# Patient Record
Sex: Female | Born: 1960 | Race: Black or African American | Hispanic: No | Smoking: Current every day smoker
Health system: Southern US, Community
[De-identification: ages and names within clinical notes are randomized; demographics above are authoritative.]

## PROBLEM LIST (undated history)

## (undated) DIAGNOSIS — R112 Nausea with vomiting, unspecified: Secondary | ICD-10-CM

## (undated) DIAGNOSIS — R519 Headache, unspecified: Secondary | ICD-10-CM

## (undated) DIAGNOSIS — J189 Pneumonia, unspecified organism: Secondary | ICD-10-CM

## (undated) DIAGNOSIS — J45909 Unspecified asthma, uncomplicated: Secondary | ICD-10-CM

## (undated) DIAGNOSIS — F32A Depression, unspecified: Secondary | ICD-10-CM

## (undated) DIAGNOSIS — E7800 Pure hypercholesterolemia, unspecified: Secondary | ICD-10-CM

## (undated) DIAGNOSIS — R51 Headache: Secondary | ICD-10-CM

## (undated) DIAGNOSIS — R569 Unspecified convulsions: Secondary | ICD-10-CM

## (undated) DIAGNOSIS — Z9889 Other specified postprocedural states: Secondary | ICD-10-CM

## (undated) DIAGNOSIS — F419 Anxiety disorder, unspecified: Secondary | ICD-10-CM

## (undated) DIAGNOSIS — K219 Gastro-esophageal reflux disease without esophagitis: Secondary | ICD-10-CM

## (undated) DIAGNOSIS — I10 Essential (primary) hypertension: Secondary | ICD-10-CM

## (undated) DIAGNOSIS — F329 Major depressive disorder, single episode, unspecified: Secondary | ICD-10-CM

## (undated) DIAGNOSIS — D219 Benign neoplasm of connective and other soft tissue, unspecified: Secondary | ICD-10-CM

## (undated) DIAGNOSIS — S065X9A Traumatic subdural hemorrhage with loss of consciousness of unspecified duration, initial encounter: Secondary | ICD-10-CM

## (undated) HISTORY — PX: DIAGNOSTIC LAPAROSCOPY: SUR761

## (undated) HISTORY — PX: DILATION AND CURETTAGE OF UTERUS: SHX78

## (undated) HISTORY — DX: Essential (primary) hypertension: I10

## (undated) HISTORY — PX: FRACTURE SURGERY: SHX138

## (undated) HISTORY — PX: TONSILLECTOMY: SUR1361

---

## 2006-01-03 ENCOUNTER — Emergency Department (HOSPITAL_COMMUNITY): Admission: EM | Admit: 2006-01-03 | Discharge: 2006-01-03 | Payer: Self-pay | Admitting: Family Medicine

## 2006-01-03 ENCOUNTER — Ambulatory Visit (HOSPITAL_COMMUNITY): Admission: RE | Admit: 2006-01-03 | Discharge: 2006-01-03 | Payer: Self-pay | Admitting: Family Medicine

## 2007-09-12 ENCOUNTER — Telehealth (INDEPENDENT_AMBULATORY_CARE_PROVIDER_SITE_OTHER): Payer: Self-pay | Admitting: *Deleted

## 2007-09-26 ENCOUNTER — Ambulatory Visit: Payer: Self-pay | Admitting: Nurse Practitioner

## 2007-09-26 DIAGNOSIS — D259 Leiomyoma of uterus, unspecified: Secondary | ICD-10-CM | POA: Insufficient documentation

## 2007-10-02 ENCOUNTER — Ambulatory Visit: Payer: Self-pay | Admitting: *Deleted

## 2007-10-30 ENCOUNTER — Ambulatory Visit: Payer: Self-pay | Admitting: Nurse Practitioner

## 2007-10-30 DIAGNOSIS — I10 Essential (primary) hypertension: Secondary | ICD-10-CM | POA: Insufficient documentation

## 2007-10-30 LAB — CONVERTED CEMR LAB
Bilirubin Urine: NEGATIVE
Glucose, Urine, Semiquant: NEGATIVE
Specific Gravity, Urine: 1.01
pH: 6.5

## 2007-10-31 ENCOUNTER — Encounter (INDEPENDENT_AMBULATORY_CARE_PROVIDER_SITE_OTHER): Payer: Self-pay | Admitting: Nurse Practitioner

## 2007-10-31 DIAGNOSIS — E7800 Pure hypercholesterolemia, unspecified: Secondary | ICD-10-CM | POA: Insufficient documentation

## 2007-10-31 LAB — CONVERTED CEMR LAB
Alkaline Phosphatase: 68 units/L (ref 39–117)
Basophils Absolute: 0 10*3/uL (ref 0.0–0.1)
Basophils Relative: 2 % — ABNORMAL HIGH (ref 0–1)
Calcium: 8.9 mg/dL (ref 8.4–10.5)
Chloride: 104 meq/L (ref 96–112)
Cholesterol: 234 mg/dL — ABNORMAL HIGH (ref 0–200)
Eosinophils Relative: 8 % — ABNORMAL HIGH (ref 0–5)
Glucose, Bld: 86 mg/dL (ref 70–99)
HCT: 38.2 % (ref 36.0–46.0)
HDL: 108 mg/dL (ref >39)
Hemoglobin: 12.9 g/dL (ref 12.0–15.0)
Hep B E Ab: NEGATIVE
LDL Cholesterol: 116 mg/dL — ABNORMAL HIGH (ref 0–99)
Lipase: 13 units/L (ref 0–75)
Monocytes Absolute: 0.3 10*3/uL (ref 0.1–1.0)
RBC: 3.82 M/uL — ABNORMAL LOW (ref 3.87–5.11)
Sodium: 142 meq/L (ref 135–145)
TSH: 1.504 microintl units/mL (ref 0.350–5.50)
Total Protein: 8.1 g/dL (ref 6.0–8.3)
Triglycerides: 49 mg/dL (ref <150)

## 2007-11-06 ENCOUNTER — Ambulatory Visit (HOSPITAL_COMMUNITY): Admission: RE | Admit: 2007-11-06 | Discharge: 2007-11-06 | Payer: Self-pay | Admitting: Family Medicine

## 2007-12-02 ENCOUNTER — Telehealth (INDEPENDENT_AMBULATORY_CARE_PROVIDER_SITE_OTHER): Payer: Self-pay | Admitting: Nurse Practitioner

## 2007-12-13 ENCOUNTER — Ambulatory Visit: Payer: Self-pay | Admitting: Nurse Practitioner

## 2007-12-13 DIAGNOSIS — D72819 Decreased white blood cell count, unspecified: Secondary | ICD-10-CM | POA: Insufficient documentation

## 2007-12-13 DIAGNOSIS — R7989 Other specified abnormal findings of blood chemistry: Secondary | ICD-10-CM | POA: Insufficient documentation

## 2007-12-13 DIAGNOSIS — R945 Abnormal results of liver function studies: Secondary | ICD-10-CM

## 2007-12-13 DIAGNOSIS — R21 Rash and other nonspecific skin eruption: Secondary | ICD-10-CM | POA: Insufficient documentation

## 2007-12-13 LAB — CONVERTED CEMR LAB
Basophils Relative: 1 % (ref 0–1)
Chlamydia, DNA Probe: NEGATIVE
Eosinophils Absolute: 0.2 10*3/uL (ref 0.0–0.7)
HCT: 42.3 % (ref 36.0–46.0)
Hemoglobin: 13.9 g/dL (ref 12.0–15.0)
Lymphs Abs: 1.6 10*3/uL (ref 0.7–4.0)
MCHC: 32.9 g/dL (ref 30.0–36.0)
MCV: 103.2 fL — ABNORMAL HIGH (ref 78.0–100.0)
Neutro Abs: 1.3 10*3/uL — ABNORMAL LOW (ref 1.7–7.7)
Pap Smear: NEGATIVE
Platelets: 254 10*3/uL (ref 150–400)

## 2007-12-18 ENCOUNTER — Ambulatory Visit (HOSPITAL_COMMUNITY): Admission: RE | Admit: 2007-12-18 | Discharge: 2007-12-18 | Payer: Self-pay | Admitting: Internal Medicine

## 2007-12-18 ENCOUNTER — Encounter (INDEPENDENT_AMBULATORY_CARE_PROVIDER_SITE_OTHER): Payer: Self-pay | Admitting: Nurse Practitioner

## 2008-02-13 ENCOUNTER — Telehealth (INDEPENDENT_AMBULATORY_CARE_PROVIDER_SITE_OTHER): Payer: Self-pay | Admitting: Nurse Practitioner

## 2008-11-05 ENCOUNTER — Telehealth (INDEPENDENT_AMBULATORY_CARE_PROVIDER_SITE_OTHER): Payer: Self-pay | Admitting: Nurse Practitioner

## 2008-11-19 ENCOUNTER — Ambulatory Visit: Payer: Self-pay | Admitting: Nurse Practitioner

## 2008-11-19 DIAGNOSIS — F17200 Nicotine dependence, unspecified, uncomplicated: Secondary | ICD-10-CM | POA: Insufficient documentation

## 2008-11-19 LAB — CONVERTED CEMR LAB
Cholesterol, target level: 200 mg/dL
HDL goal, serum: 40 mg/dL

## 2008-11-23 LAB — CONVERTED CEMR LAB
ALT: 19 units/L (ref 0–35)
BUN: 8 mg/dL (ref 6–23)
CO2: 22 meq/L (ref 19–32)
Calcium: 9.2 mg/dL (ref 8.4–10.5)
Cholesterol: 250 mg/dL — ABNORMAL HIGH (ref 0–200)
Glucose, Bld: 75 mg/dL (ref 70–99)
HCT: 41 % (ref 36.0–46.0)
Hemoglobin: 13.7 g/dL (ref 12.0–15.0)
Lymphocytes Relative: 54 % — ABNORMAL HIGH (ref 12–46)
Lymphs Abs: 1.8 10*3/uL (ref 0.7–4.0)
MCV: 103 fL — ABNORMAL HIGH (ref 78.0–100.0)
Monocytes Relative: 13 % — ABNORMAL HIGH (ref 3–12)
Neutro Abs: 0.9 10*3/uL — ABNORMAL LOW (ref 1.7–7.7)
Potassium: 4.1 meq/L (ref 3.5–5.3)
RBC: 3.98 M/uL (ref 3.87–5.11)
TSH: 1.368 microintl units/mL (ref 0.350–4.50)
Total Protein: 8.4 g/dL — ABNORMAL HIGH (ref 6.0–8.3)
Triglycerides: 61 mg/dL (ref <150)
VLDL: 12 mg/dL (ref 0–40)

## 2008-11-27 ENCOUNTER — Telehealth (INDEPENDENT_AMBULATORY_CARE_PROVIDER_SITE_OTHER): Payer: Self-pay | Admitting: Nurse Practitioner

## 2008-12-04 ENCOUNTER — Ambulatory Visit: Payer: Self-pay | Admitting: Nurse Practitioner

## 2008-12-08 LAB — CONVERTED CEMR LAB
Eosinophils Absolute: 0.2 10*3/uL (ref 0.0–0.7)
Lymphocytes Relative: 39 % (ref 12–46)
Lymphs Abs: 1.3 10*3/uL (ref 0.7–4.0)
MCV: 101.4 fL — ABNORMAL HIGH (ref 78.0–100.0)
Monocytes Absolute: 0.5 10*3/uL (ref 0.1–1.0)
Neutro Abs: 1.2 10*3/uL — ABNORMAL LOW (ref 1.7–7.7)
Neutrophils Relative %: 38 % — ABNORMAL LOW (ref 43–77)
Platelets: 257 10*3/uL (ref 150–400)
RDW: 12.9 % (ref 11.5–15.5)
WBC: 3.3 10*3/uL — ABNORMAL LOW (ref 4.0–10.5)

## 2008-12-16 ENCOUNTER — Encounter (INDEPENDENT_AMBULATORY_CARE_PROVIDER_SITE_OTHER): Payer: Self-pay | Admitting: Nurse Practitioner

## 2008-12-16 ENCOUNTER — Ambulatory Visit: Payer: Self-pay | Admitting: Internal Medicine

## 2009-01-26 ENCOUNTER — Ambulatory Visit: Payer: Self-pay | Admitting: Nurse Practitioner

## 2009-01-26 ENCOUNTER — Encounter (INDEPENDENT_AMBULATORY_CARE_PROVIDER_SITE_OTHER): Payer: Self-pay | Admitting: Nurse Practitioner

## 2009-01-26 DIAGNOSIS — M6740 Ganglion, unspecified site: Secondary | ICD-10-CM | POA: Insufficient documentation

## 2009-01-26 LAB — CONVERTED CEMR LAB
Chlamydia, DNA Probe: NEGATIVE
GC Probe Amp, Genital: NEGATIVE
KOH Prep: NEGATIVE
Ketones, urine, test strip: NEGATIVE
Specific Gravity, Urine: 1.015
Urobilinogen, UA: 0.2
pH: 6.5

## 2009-01-29 ENCOUNTER — Encounter (INDEPENDENT_AMBULATORY_CARE_PROVIDER_SITE_OTHER): Payer: Self-pay | Admitting: Nurse Practitioner

## 2009-02-12 ENCOUNTER — Ambulatory Visit (HOSPITAL_COMMUNITY): Admission: RE | Admit: 2009-02-12 | Discharge: 2009-02-12 | Payer: Self-pay | Admitting: Internal Medicine

## 2009-02-12 ENCOUNTER — Encounter (INDEPENDENT_AMBULATORY_CARE_PROVIDER_SITE_OTHER): Payer: Self-pay | Admitting: Nurse Practitioner

## 2009-02-16 ENCOUNTER — Encounter (INDEPENDENT_AMBULATORY_CARE_PROVIDER_SITE_OTHER): Payer: Self-pay | Admitting: Nurse Practitioner

## 2009-02-16 ENCOUNTER — Ambulatory Visit: Payer: Self-pay | Admitting: Internal Medicine

## 2009-02-25 ENCOUNTER — Telehealth (INDEPENDENT_AMBULATORY_CARE_PROVIDER_SITE_OTHER): Payer: Self-pay | Admitting: Nurse Practitioner

## 2010-03-21 ENCOUNTER — Emergency Department (HOSPITAL_COMMUNITY): Admission: EM | Admit: 2010-03-21 | Discharge: 2010-03-22 | Payer: Self-pay | Admitting: Emergency Medicine

## 2010-06-16 ENCOUNTER — Telehealth (INDEPENDENT_AMBULATORY_CARE_PROVIDER_SITE_OTHER): Payer: Self-pay | Admitting: Nurse Practitioner

## 2010-06-16 DIAGNOSIS — K029 Dental caries, unspecified: Secondary | ICD-10-CM | POA: Insufficient documentation

## 2010-06-20 ENCOUNTER — Encounter (INDEPENDENT_AMBULATORY_CARE_PROVIDER_SITE_OTHER): Payer: Self-pay | Admitting: Nurse Practitioner

## 2010-08-10 ENCOUNTER — Ambulatory Visit: Payer: Self-pay | Admitting: Nurse Practitioner

## 2010-08-10 DIAGNOSIS — G40909 Epilepsy, unspecified, not intractable, without status epilepticus: Secondary | ICD-10-CM | POA: Insufficient documentation

## 2010-08-10 LAB — CONVERTED CEMR LAB: Rapid HIV Screen: NEGATIVE

## 2010-08-11 ENCOUNTER — Encounter (INDEPENDENT_AMBULATORY_CARE_PROVIDER_SITE_OTHER): Payer: Self-pay | Admitting: Nurse Practitioner

## 2010-08-15 LAB — CONVERTED CEMR LAB
AST: 34 units/L (ref 0–37)
Albumin: 3.9 g/dL (ref 3.5–5.2)
BUN: 7 mg/dL (ref 6–23)
Basophils Absolute: 0.1 10*3/uL (ref 0.0–0.1)
CO2: 29 meq/L (ref 19–32)
Chloride: 107 meq/L (ref 96–112)
Glucose, Bld: 92 mg/dL (ref 70–99)
HCT: 36.7 % (ref 36.0–46.0)
Hemoglobin: 12.5 g/dL (ref 12.0–15.0)
Lymphocytes Relative: 40 % (ref 12–46)
Lymphs Abs: 1.5 10*3/uL (ref 0.7–4.0)
MCHC: 34.1 g/dL (ref 30.0–36.0)
MCV: 101.4 fL — ABNORMAL HIGH (ref 78.0–100.0)
Monocytes Absolute: 0.3 10*3/uL (ref 0.1–1.0)
RBC: 3.62 M/uL — ABNORMAL LOW (ref 3.87–5.11)
Sodium: 143 meq/L (ref 135–145)
Total Bilirubin: 0.2 mg/dL — ABNORMAL LOW (ref 0.3–1.2)
Total Protein: 7.4 g/dL (ref 6.0–8.3)
WBC: 3.9 10*3/uL — ABNORMAL LOW (ref 4.0–10.5)

## 2010-08-18 ENCOUNTER — Ambulatory Visit: Payer: Self-pay | Admitting: Oncology

## 2010-08-23 ENCOUNTER — Encounter (INDEPENDENT_AMBULATORY_CARE_PROVIDER_SITE_OTHER): Payer: Self-pay | Admitting: Nurse Practitioner

## 2010-08-23 LAB — COMPREHENSIVE METABOLIC PANEL
ALT: 18 U/L (ref 0–35)
BUN: 5 mg/dL — ABNORMAL LOW (ref 6–23)
CO2: 26 mEq/L (ref 19–32)
Chloride: 103 mEq/L (ref 96–112)
Creatinine, Ser: 0.66 mg/dL (ref 0.40–1.20)
Glucose, Bld: 78 mg/dL (ref 70–99)
Potassium: 3.7 mEq/L (ref 3.5–5.3)
Total Bilirubin: 0.4 mg/dL (ref 0.3–1.2)

## 2010-08-23 LAB — CBC WITH DIFFERENTIAL/PLATELET
Basophils Absolute: 0 10*3/uL (ref 0.0–0.1)
EOS%: 6.3 % (ref 0.0–7.0)
HCT: 36.2 % (ref 34.8–46.6)
LYMPH%: 51.5 % — ABNORMAL HIGH (ref 14.0–49.7)
MCH: 35.4 pg — ABNORMAL HIGH (ref 25.1–34.0)
MCHC: 34.5 g/dL (ref 31.5–36.0)
MCV: 102.8 fL — ABNORMAL HIGH (ref 79.5–101.0)
MONO#: 0.3 10*3/uL (ref 0.1–0.9)
MONO%: 8.1 % (ref 0.0–14.0)
Platelets: 192 10*3/uL (ref 145–400)
RDW: 13.4 % (ref 11.2–14.5)
WBC: 3.5 10*3/uL — ABNORMAL LOW (ref 3.9–10.3)
lymph#: 1.8 10*3/uL (ref 0.9–3.3)

## 2010-08-26 ENCOUNTER — Ambulatory Visit: Payer: Self-pay | Admitting: Nurse Practitioner

## 2010-08-26 LAB — CONVERTED CEMR LAB
Urobilinogen, UA: 0.2
WBC Urine, dipstick: NEGATIVE
pH: 5

## 2010-08-29 LAB — CONVERTED CEMR LAB
Cholesterol: 262 mg/dL — ABNORMAL HIGH (ref 0–200)
LDL Cholesterol: 132 mg/dL — ABNORMAL HIGH (ref 0–99)

## 2010-08-30 ENCOUNTER — Encounter (INDEPENDENT_AMBULATORY_CARE_PROVIDER_SITE_OTHER): Payer: Self-pay | Admitting: Nurse Practitioner

## 2010-11-22 NOTE — Progress Notes (Signed)
Summary: requesting referral  Phone Note Call from Patient Call back at [phone]   Summary of Call: Pt is calling to see if she can get a referral to dental clinic. states front teeth had fallen out and having pain in mouth last month.  Initial call taken by: Hassell Halim CMA,  June 16, 2010 3:30 PM  Follow-up for Phone Call        Dental referral done fax to dental clinic Follow-up by: Lehman Prom FNP,  June 16, 2010 3:35 PM  Additional Follow-up for Phone Call Additional follow up Details #1::        dental referral faxed to clinic today. Additional Follow-up by: Leodis Rains,  June 20, 2010 11:15 AM  New Problems: DENTAL CARIES (ICD-521.00)   New Problems: DENTAL CARIES (ICD-521.00)

## 2010-11-22 NOTE — Letter (Signed)
Summary: DENTAL REFERRAL  DENTAL REFERRAL   Imported By: Arta Bruce 08/12/2010 10:50:35  _____________________________________________________________________  External Attachment:    Type:   Image     Comment:   External Document

## 2010-11-22 NOTE — Assessment & Plan Note (Signed)
Summary: Seizure    Vital Signs:  Patient profile:   50 year old female LMP:     06/2010 Weight:      154.4 pounds BMI:     24.09 Temp:     97.1 degrees F oral Pulse rate:   72 / minute Pulse rhythm:   regular Resp:     16 per minute BP sitting:   140 / 100  (left arm) Cuff size:   regular  Vitals Entered By: Levon Hedger (August 10, 2010 3:39 PM) CC: pt states she had a seizure and had a brain scan at [facility]. about a week ago she had another seizure and she states that she has been under alot of stress, Hypertension Management, Lipid Management Is Patient Diabetic? No Pain Assessment Patient in pain? no       Does patient need assistance? Functional Status Self care Ambulation Normal Comments pt is not taking any medication. LMP (date): 06/2010 LMP - Character: heavy     Enter LMP: 06/2010 Last PAP Result NEGATIVE FOR INTRAEPITHELIAL LESIONS OR MALIGNANCY.   CC:  pt states she had a seizure and had a brain scan at [facility]. about a week ago she had another seizure and she states that she has been under alot of stress, Hypertension Management, and Lipid Management.  History of Present Illness:  Pt into the office with complaints of not feeling well for the past 3 months. She reports 2 seizures over the past 3 months. Pt lost consciousness with both seizures.  No prior history of seizures. Ambulance called and pt was taken to the hospital on the first episode and then on the second episode she was give a choice when she regained consciousness. +incontinence (loss of bladder) +family witnessed jerking movements +biting of lips and jaw Head CT done in hospital  Social - pt is still employed at [employer] to increased stressors in her life due to finances.  Tobacco abuse - ongoing  ETOH - use (would not admit to a certain amount) at minimal 1 beer daily.  No current meds - she was previously on blood pressure and cholesterol meds of which she is not taking  Pt  is not driving at this time as her car is broken down.  She either walks or catches the bus  Hypertension History:      She denies headache, chest pain, and palpitations.  Pt has stopped taking her blood pressure medications and does not offer an excuse for not taking it.        Positive major cardiovascular risk factors include hyperlipidemia, hypertension, and current tobacco user.  Negative major cardiovascular risk factors include female age less than 27 years old, no history of diabetes, and negative family history for ischemic heart disease.        Further assessment for target organ damage reveals no history of ASHD, cardiac end-organ damage (CHF/LVH), stroke/TIA, peripheral vascular disease, renal insufficiency, or hypertensive retinopathy.    Lipid Management History:      Positive NCEP/ATP III risk factors include current tobacco user and hypertension.  Negative NCEP/ATP III risk factors include female age less than 76 years old, non-diabetic, HDL cholesterol greater than 60, no family history for ischemic heart disease, no ASHD (atherosclerotic heart disease), no prior stroke/TIA, no peripheral vascular disease, and no history of aortic aneurysm.        Comments include: pt was on zetia but has not been taking it recently.      Habits &  Providers  Alcohol-Tobacco-Diet     Alcohol drinks/day: 1     Alcohol Counseling: to decrease amount and/or frequency of alcohol intake     Alcohol type: beer     Tobacco Status: current     Tobacco Counseling: to quit use of tobacco products     Cigarette Packs/Day: 1/2  Exercise-Depression-Behavior     Does Patient Exercise: yes     Type of exercise: walking     Times/week: 4     Have you felt down or hopeless? no     Have you felt little pleasure in things? no     Depression Counseling: not indicated; screening negative for depression     Seat Belt Use: 100  Comments: walks back and forth to work  Allergies (verified): 1)  !  Tylenol  Review of Systems CV:  Denies chest pain or discomfort. Resp:  Denies cough. GI:  Denies abdominal pain, nausea, and vomiting. Neuro:  Complains of seizures; denies headaches. Psych:  Complains of anxiety; admits to increase stressors in her life.  Physical Exam  General:  alert.   Head:  normocephalic.   Eyes:  glasses Mouth:  poor dentation Lungs:  normal breath sounds.   Heart:  normal rate and regular rhythm.   Msk:  up to the exam table Neurologic:  alert & oriented X3.   Skin:  color normal.   Psych:  Oriented X3.     Impression & Recommendations:  Problem # 1:  SEIZURE DISORDER (ICD-780.39) will need to find out cause pt may need referral to neurology  Problem # 2:  TOBACCO ABUSE (ICD-305.1) ongoing.  pt was advised to quit.  Problem # 3:  HYPERTENSION, BENIGN ESSENTIAL (ICD-401.1) will restart on meds Her updated medication list for this problem includes:    Lisinopril 10 Mg Tabs (Lisinopril) ..... One tablet by mouth daily for blood pressure  Orders: T-Comprehensive Metabolic Panel (13086-57846) T-CBC w/Diff (96295-28413) Rapid HIV  (24401) T-Syphilis Test (RPR) (02725-36644) T-TSH (03474-25956)  Problem # 4:  HYPERCHOLESTEROLEMIA (ICD-272.0) pt is not taking meds. will need to check on next visit Her updated medication list for this problem includes:    Zetia 10 Mg Tabs (Ezetimibe) ..... 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 One tablet by mouth daily for blood pressure 2)  Vicoprofen 7.5-200 Mg Tabs (Hydrocodone-ibuprofen) .Marland Kitchen.. 1 tablet daily as needed for pain 3)  Zetia 10 Mg Tabs (Ezetimibe) .... Hold  Hypertension Assessment/Plan:      The patient's hypertensive risk group is category B:  At least one risk factor (excluding diabetes) with no target organ damage.  Her calculated 10 year risk of coronary heart disease is 7 %.  Today's blood pressure is 140/100.  Her blood pressure goal is < 140/90.  Lipid Assessment/Plan:      Based on NCEP/ATP III, the patient's risk factor category is "0-1 risk factors".  The patient's lipid goals are as follows: Total cholesterol goal is 200; LDL cholesterol goal is 160; HDL cholesterol goal is 40; Triglyceride goal is 150.    Patient Instructions: 1)  You have declined the flu vaccine today but if you change your mind and would like to get the vaccine then return to this office.  This is not a  live virus so it will not make you sick. It was a live virus years ago so it may have made you sick years ago 2)  Follow up in 2 weeks with n.martin,fnp for f/u on seizures.  Come fasting after midnight before this appointment.  You will get lipids, u/a, microalbumin. 3)  You will be notified of any abnormal labs done in office today. 4)  Seizures can have many causes - will check labs today.  Also may be due to increase in stress Prescriptions: LISINOPRIL 10 MG TABS (LISINOPRIL) One tablet by mouth daily for blood pressure  #30 x 3   Entered and Authorized by:   Lehman Prom FNP   Signed by:   Lehman Prom FNP on 08/10/2010   Method used:   Print then Give to Patient   RxID:   [id number]    Orders Added: 1)  Est. Patient Level III [87564] 2)  T-Comprehensive Metabolic Panel [80053-22900] 3)  T-CBC w/Diff [33295-18841] 4)  Rapid HIV  [92370] 5)  T-Syphilis Test (RPR) [66063-01601] 6)  T-TSH [09323-55732] 7)  Dental Referral [Dentist]    Laboratory Results    Other Tests  Rapid HIV: negative

## 2010-11-22 NOTE — Letter (Signed)
Summary: Handout Printed  Printed Handout:  - Diet - Low-Cholesterol Guidelines 

## 2010-11-22 NOTE — Letter (Signed)
Summary: Lipid Letter  [facility]  [address] [place], [place] [address]   Phone: [phone]  Fax: [phone]    08/30/2010  [facility] [address] [place], [place]  [address]  Dear Bosie Clos:  We have carefully reviewed your last lipid profile from 08/26/2010 and the results are noted below with a summary of recommendations for lipid management.    Cholesterol:      262    Goal: less than 200   HDL "good" Cholesterol:   295     Goal: greater than 40   LDL "bad" Cholesterol:   132     Goal: less than 130   Triglycerides:       58     Goal: less than 150    Your total cholesterol is VERY elevated.  You should have been contacted by this office about the need to start medications. You should call to schedule a fasting lab visit in 6-8 weeks AFTER you start the medications.  You will need labs to see if the cholesterol is improving and to check your liver.      Current Medications: 1)    Lisinopril 10 Mg Tabs (Lisinopril) .... One tablet by mouth daily for blood pressure 2)    Vicoprofen 7.5-200 Mg  Tabs (Hydrocodone-ibuprofen) .Marland Kitchen.. 1 tablet daily as needed for pain 3)    Pravastatin Sodium 20 Mg Tabs (Pravastatin sodium) .... One tablet by mouth nightly for cholesterol  If you have any questions, please call. We appreciate being able to work with you.   Sincerely,    [facility] Lehman Prom FNP

## 2010-11-22 NOTE — Letter (Signed)
Summary: Handout Printed  Printed Handout:  - Non-epileptic Seizures - Brief

## 2010-11-22 NOTE — Letter (Signed)
[facility]  [facility]   Imported By: Arta Bruce 09/21/2010 11:03:19  _____________________________________________________________________  External Attachment:    Type:   Image     Comment:   External Document

## 2010-11-22 NOTE — Assessment & Plan Note (Signed)
Summary: F/u 2 week - Seizures   Vital Signs:  Patient profile:   50 year old female Menstrual status:  perimenopausal Weight:      149.4 pounds BMI:     23.31 Temp:     97.2 degrees F oral Pulse rate:   84 / minute Pulse rhythm:   regular Resp:     20 per minute BP sitting:   126 / 90  (left arm) Cuff size:   regular  Vitals Entered By: Levon Hedger (August 26, 2010 11:01 AM) CC: extremely stressed, Hypertension Management, Lipid Management Is Patient Diabetic? No Pain Assessment Patient in pain? no       Does patient need assistance? Functional Status Self care Ambulation Normal LMP - Character: heavy     Menstrual Status perimenopausal Last PAP Result NEGATIVE FOR INTRAEPITHELIAL LESIONS OR MALIGNANCY.   CC:  extremely stressed, Hypertension Management, and Lipid Management.  History of Present Illness:  Pt into the office for 2 week f/u on seizures. No seizure activities since her last visit. Admits that stress is a contributor to her health.  Leukocytosis - Pt did go to the cancer center and got labs earlier week. She is waiting for the results  Hypertension History:      She denies headache, chest pain, and palpitations.  Pt has started taking her blood pressure medications as ordered.        Positive major cardiovascular risk factors include hyperlipidemia, hypertension, and current tobacco user.  Negative major cardiovascular risk factors include female age less than 22 years old, no history of diabetes, and negative family history for ischemic heart disease.        Further assessment for target organ damage reveals no history of ASHD, cardiac end-organ damage (CHF/LVH), stroke/TIA, peripheral vascular disease, renal insufficiency, or hypertensive retinopathy.    Lipid Management History:      Positive NCEP/ATP III risk factors include current tobacco user and hypertension.  Negative NCEP/ATP III risk factors include female age less than 32 years old,  non-diabetic, HDL cholesterol greater than 60, no family history for ischemic heart disease, no ASHD (atherosclerotic heart disease), no prior stroke/TIA, no peripheral vascular disease, and no history of aortic aneurysm.      Habits & Providers  Alcohol-Tobacco-Diet     Alcohol drinks/day: 1     Alcohol Counseling: to decrease amount and/or frequency of alcohol intake     Alcohol type: beer     Tobacco Status: current     Tobacco Counseling: to quit use of tobacco products     Cigarette Packs/Day: 1/2  Exercise-Depression-Behavior     Does Patient Exercise: yes     Exercise Counseling: not indicated; exercise is adequate     Type of exercise: walking     Times/week: 4     Have you felt little pleasure in things? no     Depression Counseling: not indicated; screening negative for depression     Seat Belt Use: 100  Allergies (verified): 1)  ! Tylenol  Review of Systems General:  Denies fever. CV:  Denies chest pain or discomfort. Resp:  Denies cough. GI:  Denies abdominal pain, nausea, and vomiting. Neuro:  Denies seizures. Psych:  Complains of anxiety.  Physical Exam  General:  alert.   Head:  normocephalic.   Eyes:  glasses Mouth:  poor dentition.   Lungs:  normal breath sounds.   Heart:  normal rate and regular rhythm.   Abdomen:  normal bowel sounds.  Msk:  up to the exam table Neurologic:  alert & oriented X3.   Skin:  color normal.   Psych:  Oriented X3.     Impression & Recommendations:  Problem # 1:  SEIZURE DISORDER (ICD-780.39) none since last visit ? pseudoseizure - pt is trying to decrease stress offered mental health counseling - pt declined  Problem # 2:  HYPERTENSION, BENIGN ESSENTIAL (ICD-401.1) BP is doing well today advised pt to continue current meds Her updated medication list for this problem includes:    Lisinopril 10 Mg Tabs (Lisinopril) ..... One tablet by mouth daily for blood pressure  Orders: T-Urine Microalbumin w/creat. ratio  [phone]) UA Dipstick w/o Micro (manual) (08657)  Problem # 3:  HYPERCHOLESTEROLEMIA (ICD-272.0) will check today Her updated medication list for this problem includes:    Zetia 10 Mg Tabs (Ezetimibe) ..... Hold  Orders: T-Lipid Profile ([id number])  Problem # 4:  TOBACCO ABUSE (ICD-305.1) advised cessation  Complete Medication List: 1)  Lisinopril 10 Mg Tabs (Lisinopril) .... One tablet by mouth daily for blood pressure 2)  Vicoprofen 7.5-200 Mg Tabs (Hydrocodone-ibuprofen) .Marland Kitchen.. 1 tablet daily as needed for pain 3)  Zetia 10 Mg Tabs (Ezetimibe) .... Hold  Hypertension Assessment/Plan:      The patient's hypertensive risk group is category B: At least one risk factor (excluding diabetes) with no target organ damage.  Her calculated 10 year risk of coronary heart disease is 6 %.  Today's blood pressure is 126/90.  Her blood pressure goal is < 140/90.  Lipid Assessment/Plan:      Based on NCEP/ATP III, the patient's risk factor category is "0-1 risk factors".  The patient's lipid goals are as follows: Total cholesterol goal is 200; LDL cholesterol goal is 160; HDL cholesterol goal is 40; Triglyceride goal is 150.    Patient Instructions: 1)  Seizures - glad you have had an a seizure since your last visit. 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 3)  The way to tell this definitively is to have an EEG to check brain waves. 4)  You should try hard to decrease stessors. 5)  This office can schedule you an appointment with the mental health counselor for sessions.  May also need some medications but this is only if absolutely necessary. 6)  Keep follow up with Blood specialist if necessary. 7)  Follow up in this office in 6 months for blood pressure or sooner if necessary   Orders Added: 1)  Est. Patient Level III [41324] 2)  T-Lipid Profile [80061-22930] 3)  T-Urine Microalbumin w/creat. ratio [82043-[address]] 4)  UA Dipstick w/o Micro (manual)  [81002]    Laboratory Results   Urine Tests  Date/Time Received: August 26, 2010 11:38 AM   Routine Urinalysis   Color: lt. yellow Appearance: Clear Glucose: negative   (Normal Range: Negative) Bilirubin: negative   (Normal Range: Negative) Ketone: trace (5)   (Normal Range: Negative) Spec. Gravity: 1.020   (Normal Range: 1.003-1.035) Blood: negative   (Normal Range: Negative) pH: 5.0   (Normal Range: 5.0-8.0) Protein: negative   (Normal Range: Negative) Urobilinogen: 0.2   (Normal Range: 0-1) Nitrite: negative   (Normal Range: Negative) Leukocyte Esterace: negative   (Normal Range: Negative)       Appended Document: F/u 2 week - Seizures pt declined the flu vaccine

## 2010-11-22 NOTE — Letter (Signed)
Summary: Handout Printed  Printed Handout:  - Seizure, Adult

## 2010-11-22 NOTE — Letter (Signed)
Summary: DENTAL REFERRAL  DENTAL REFERRAL   Imported By: Arta Bruce 06/20/2010 11:32:30  _____________________________________________________________________  External Attachment:    Type:   Image     Comment:   External Document

## 2011-01-09 LAB — POCT I-STAT, CHEM 8
BUN: 5 mg/dL — ABNORMAL LOW (ref 6–23)
Calcium, Ion: 1.07 mmol/L — ABNORMAL LOW (ref 1.12–1.32)
Chloride: 101 mEq/L (ref 96–112)
Glucose, Bld: 107 mg/dL — ABNORMAL HIGH (ref 70–99)
HCT: 36 % (ref 36.0–46.0)
Hemoglobin: 12.2 g/dL (ref 12.0–15.0)
Potassium: 2.7 mEq/L — CL (ref 3.5–5.1)
Sodium: 138 mEq/L (ref 135–145)
TCO2: 27 mmol/L (ref 0–100)

## 2011-01-09 LAB — POCT CARDIAC MARKERS: Myoglobin, poc: >500 ng/mL (ref 12–200)

## 2011-01-09 LAB — DIFFERENTIAL
Basophils Absolute: 0 10*3/uL (ref 0.0–0.1)
Eosinophils Relative: 2 % (ref 0–5)
Lymphocytes Relative: 21 % (ref 12–46)

## 2011-01-09 LAB — URINE MICROSCOPIC-ADD ON

## 2011-01-09 LAB — CBC
Hemoglobin: 11.4 g/dL — ABNORMAL LOW (ref 12.0–15.0)
MCHC: 34.3 g/dL (ref 30.0–36.0)
MCV: 102.7 fL — ABNORMAL HIGH (ref 78.0–100.0)
RBC: 3.24 MIL/uL — ABNORMAL LOW (ref 3.87–5.11)

## 2011-01-09 LAB — URINALYSIS, ROUTINE W REFLEX MICROSCOPIC
Hgb urine dipstick: NEGATIVE
Nitrite: NEGATIVE
Protein, ur: 30 mg/dL — AB
Specific Gravity, Urine: 1.027 (ref 1.005–1.030)
Urobilinogen, UA: 1 mg/dL (ref 0.0–1.0)

## 2011-01-09 LAB — RAPID URINE DRUG SCREEN, HOSP PERFORMED
Amphetamines: NOT DETECTED
Barbiturates: NOT DETECTED
Benzodiazepines: NOT DETECTED
Cocaine: NOT DETECTED

## 2011-01-09 LAB — ETHANOL: Alcohol, Ethyl (B): <5 mg/dL (ref 0–10)

## 2011-01-09 LAB — GLUCOSE, CAPILLARY

## 2011-01-13 ENCOUNTER — Emergency Department (HOSPITAL_COMMUNITY)
Admission: EM | Admit: 2011-01-13 | Discharge: 2011-01-13 | Disposition: A | Discharge status: Home / Self Care | Payer: Self-pay | Attending: Emergency Medicine | Admitting: Emergency Medicine

## 2011-01-13 ENCOUNTER — Emergency Department (HOSPITAL_COMMUNITY): Payer: Self-pay

## 2011-01-13 DIAGNOSIS — S0100XA Unspecified open wound of scalp, initial encounter: Secondary | ICD-10-CM | POA: Insufficient documentation

## 2011-01-13 DIAGNOSIS — I10 Essential (primary) hypertension: Secondary | ICD-10-CM | POA: Insufficient documentation

## 2011-01-13 DIAGNOSIS — Y990 Civilian activity done for income or pay: Secondary | ICD-10-CM | POA: Insufficient documentation

## 2011-01-13 DIAGNOSIS — G40909 Epilepsy, unspecified, not intractable, without status epilepticus: Secondary | ICD-10-CM | POA: Insufficient documentation

## 2011-01-13 DIAGNOSIS — W19XXXA Unspecified fall, initial encounter: Secondary | ICD-10-CM | POA: Insufficient documentation

## 2011-01-21 ENCOUNTER — Emergency Department (HOSPITAL_COMMUNITY)
Admission: EM | Admit: 2011-01-21 | Discharge: 2011-01-21 | Disposition: A | Discharge status: Home / Self Care | Payer: Self-pay | Attending: Emergency Medicine | Admitting: Emergency Medicine

## 2011-01-21 DIAGNOSIS — Z4802 Encounter for removal of sutures: Secondary | ICD-10-CM | POA: Insufficient documentation

## 2011-01-21 DIAGNOSIS — L259 Unspecified contact dermatitis, unspecified cause: Secondary | ICD-10-CM | POA: Insufficient documentation

## 2011-07-05 ENCOUNTER — Encounter: Payer: Self-pay | Admitting: Obstetrics & Gynecology

## 2011-09-02 ENCOUNTER — Inpatient Hospital Stay (HOSPITAL_COMMUNITY): Payer: Worker's Compensation

## 2011-09-02 ENCOUNTER — Inpatient Hospital Stay (HOSPITAL_COMMUNITY)
Admission: AD | Admit: 2011-09-02 | Discharge: 2011-09-04 | DRG: 512 | Disposition: A | Discharge status: Home / Self Care | Payer: Worker's Compensation | Source: Ambulatory Visit | Attending: Orthopedic Surgery | Admitting: Orthopedic Surgery

## 2011-09-02 DIAGNOSIS — Y990 Civilian activity done for income or pay: Secondary | ICD-10-CM

## 2011-09-02 DIAGNOSIS — F17200 Nicotine dependence, unspecified, uncomplicated: Secondary | ICD-10-CM | POA: Diagnosis present

## 2011-09-02 DIAGNOSIS — I10 Essential (primary) hypertension: Secondary | ICD-10-CM | POA: Diagnosis present

## 2011-09-02 DIAGNOSIS — S52209A Unspecified fracture of shaft of unspecified ulna, initial encounter for closed fracture: Principal | ICD-10-CM | POA: Diagnosis present

## 2011-09-02 DIAGNOSIS — Y9269 Other specified industrial and construction area as the place of occurrence of the external cause: Secondary | ICD-10-CM

## 2011-09-02 DIAGNOSIS — W2203XA Walked into furniture, initial encounter: Secondary | ICD-10-CM | POA: Diagnosis present

## 2011-09-02 LAB — DIFFERENTIAL
Eosinophils Absolute: 0.1 10*3/uL (ref 0.0–0.7)
Eosinophils Relative: 2 % (ref 0–5)
Lymphocytes Relative: 40 % (ref 12–46)
Lymphs Abs: 2.2 10*3/uL (ref 0.7–4.0)
Monocytes Absolute: 0.5 10*3/uL (ref 0.1–1.0)

## 2011-09-02 LAB — CBC
HCT: 36.9 % (ref 36.0–46.0)
MCH: 34.2 pg — ABNORMAL HIGH (ref 26.0–34.0)
MCV: 102.5 fL — ABNORMAL HIGH (ref 78.0–100.0)
RBC: 3.6 MIL/uL — ABNORMAL LOW (ref 3.87–5.11)
WBC: 5.4 10*3/uL (ref 4.0–10.5)

## 2011-09-02 LAB — BASIC METABOLIC PANEL
BUN: 7 mg/dL (ref 6–23)
CO2: 28 mEq/L (ref 19–32)
Calcium: 9 mg/dL (ref 8.4–10.5)
Creatinine, Ser: 0.62 mg/dL (ref 0.50–1.10)
GFR calc non Af Amer: >90 mL/min (ref >90)
Glucose, Bld: 77 mg/dL (ref 70–99)

## 2011-09-02 MED ORDER — POTASSIUM CHLORIDE IN NACL 20-0.9 MEQ/L-% IV SOLN
INTRAVENOUS | Status: DC
  Administered 2011-09-02: 23:00:00 via INTRAVENOUS
  Filled 2011-09-02 (×4): qty 1000

## 2011-09-02 MED ORDER — KCL IN DEXTROSE-NACL 20-5-0.9 MEQ/L-%-% IV SOLN
INTRAVENOUS | Status: DC
  Administered 2011-09-03: 21:00:00 via INTRAVENOUS
  Filled 2011-09-02 (×3): qty 1000

## 2011-09-02 MED ORDER — CEFAZOLIN SODIUM-DEXTROSE 2-3 GM-% IV SOLR
2.0000 g | INTRAVENOUS | Status: AC
  Administered 2011-09-03: 2 g via INTRAVENOUS
  Filled 2011-09-02: qty 50

## 2011-09-02 NOTE — H&P (Signed)
Cynthia Welch is an 50 y.o. female.   Chief Complaint:  HPI: Cynthia Welch is a 50 year old right-hand dominant female with right arm pain she was working at [employer] today which her arm hit a table she developed some pain in the arm was able to keep working after developing swelling in the arm she was told by her supervisor to go and have it looked at the urgent care I was called by Dr. Alwyn Ren tonight and subsequently she was determined to have a fractured ulna she presents now for further operative management. She denies numbness tingling the hand she denies any prodromal symptoms in the right arm and hand.  Past medical history is negative  Past surgical history is negative  Family history is negative for deep vein thrombosis Social History:  does not have a smoking history on file. She does not have any smokeless tobacco history on file. Her alcohol and drug histories not on file. patient does have support in the savings. She lives with her boyfriend.  Allergies:  Allergies  Allergen Reactions  . Acetaminophen     REACTION: itching    Medications Prior to Admission  Medication Dose Route Frequency Provider Last Rate Last Dose  . dextrose 5 % and 0.9 % NaCl with KCl 20 mEq/L infusion   Intravenous Continuous Cammy Copa       No current outpatient prescriptions on file as of 09/02/2011.    Results for orders placed during the hospital encounter of 09/02/11 (from the past 48 hour(s))  CBC     Status: Abnormal   Collection Time   09/02/11  9:44 PM      Component Value Range Comment   WBC 5.4  4.0 - 10.5 (K/uL)    RBC 3.60 (*) 3.87 - 5.11 (MIL/uL)    Hemoglobin 12.3  12.0 - 15.0 (g/dL)    HCT 47.8  29.5 - 62.1 (%)    MCV 102.5 (*) 78.0 - 100.0 (fL)    MCH 34.2 (*) 26.0 - 34.0 (pg)    MCHC 33.3  30.0 - 36.0 (g/dL)    RDW 30.8  65.7 - 84.6 (%)    Platelets 246  150 - 400 (K/uL)   DIFFERENTIAL     Status: Normal   Collection Time   09/02/11  9:44 PM      Component  Value Range Comment   Neutrophils Relative 48  43 - 77 (%)    Neutro Abs 2.6  1.7 - 7.7 (K/uL)    Lymphocytes Relative 40  12 - 46 (%)    Lymphs Abs 2.2  0.7 - 4.0 (K/uL)    Monocytes Relative 9  3 - 12 (%)    Monocytes Absolute 0.5  0.1 - 1.0 (K/uL)    Eosinophils Relative 2  0 - 5 (%)    Eosinophils Absolute 0.1  0.0 - 0.7 (K/uL)    Basophils Relative 1  0 - 1 (%)    Basophils Absolute 0.1  0.0 - 0.1 (K/uL)    No results found.  Review of Systems  Musculoskeletal: Positive for joint pain.  All other systems reviewed and are negative.    Blood pressure 132/92, pulse 81, temperature 98.8 F (37.1 C), resp. rate 18, SpO2 100.00%. Physical Exam  Constitutional: She is oriented to person, place, and time. She appears well-developed and well-nourished.  HENT:  Head: Normocephalic and atraumatic.  Eyes: Conjunctivae and EOM are normal. Pupils are equal, round, and reactive to light.  Neck: Normal  range of motion. Neck supple.  Cardiovascular: Normal rate, regular rhythm, normal heart sounds and intact distal pulses.   Respiratory: Effort normal and breath sounds normal.  GI: Soft.  Musculoskeletal:       Arms: Neurological: She is alert and oriented to person, place, and time.  Skin: Skin is warm and dry.  Psychiatric: She has a normal mood and affect. Her behavior is normal. Judgment normal.     Assessment/Plan Outside radiographs are reviewed of the right arm she does have a ulnar shaft fracture oblique which is displaced impression; right displaced ulnar shaft fracture from impact plan open reduction internal fixation risks and benefits are discussed with the patient including but not limited to infection nerve or vessel damage incomplete union her risk of malunion and 9 units slightly higher because of her smoking time out of work will be on the order of 6 weeks all questions answered patient understands risks and benefits medical decision-making calcaneum a decision for  surgery.  DEAN,GREGORY SCOTT 09/02/2011, 10:43 PM

## 2011-09-03 ENCOUNTER — Encounter (HOSPITAL_COMMUNITY)
Admission: AD | Disposition: A | Discharge status: Home / Self Care | Payer: Self-pay | Source: Ambulatory Visit | Attending: Orthopedic Surgery

## 2011-09-03 ENCOUNTER — Encounter (HOSPITAL_COMMUNITY): Payer: Self-pay | Admitting: Anesthesiology

## 2011-09-03 ENCOUNTER — Inpatient Hospital Stay (HOSPITAL_COMMUNITY): Payer: Worker's Compensation

## 2011-09-03 ENCOUNTER — Other Ambulatory Visit: Payer: Self-pay

## 2011-09-03 ENCOUNTER — Inpatient Hospital Stay (HOSPITAL_COMMUNITY): Payer: Worker's Compensation | Admitting: Anesthesiology

## 2011-09-03 HISTORY — PX: ORIF ULNAR FRACTURE: SHX5417

## 2011-09-03 SURGERY — OPEN REDUCTION INTERNAL FIXATION (ORIF) ULNAR FRACTURE
Anesthesia: General | Laterality: Right

## 2011-09-03 MED ORDER — MORPHINE SULFATE 2 MG/ML IJ SOLN
2.0000 mg | INTRAMUSCULAR | Status: DC | PRN
  Filled 2011-09-03: qty 1

## 2011-09-03 MED ORDER — ONDANSETRON HCL 4 MG/2ML IJ SOLN
INTRAMUSCULAR | Status: DC | PRN
  Administered 2011-09-03: 4 mg via INTRAVENOUS

## 2011-09-03 MED ORDER — NEOSTIGMINE METHYLSULFATE 1 MG/ML IJ SOLN
INTRAMUSCULAR | Status: DC | PRN
  Administered 2011-09-03: 4 mg via INTRAVENOUS

## 2011-09-03 MED ORDER — FENTANYL CITRATE 0.05 MG/ML IJ SOLN
INTRAMUSCULAR | Status: DC | PRN
  Administered 2011-09-03: 100 ug via INTRAVENOUS
  Administered 2011-09-03: 50 ug via INTRAVENOUS
  Administered 2011-09-03: 100 ug via INTRAVENOUS
  Administered 2011-09-03: 50 ug via INTRAVENOUS

## 2011-09-03 MED ORDER — CEFAZOLIN SODIUM 1-5 GM-% IV SOLN
1.0000 g | Freq: Four times a day (QID) | INTRAVENOUS | Status: AC
  Administered 2011-09-03 – 2011-09-04 (×3): 1 g via INTRAVENOUS
  Filled 2011-09-03 (×3): qty 50

## 2011-09-03 MED ORDER — LACTATED RINGERS IV SOLN: INTRAVENOUS | Status: DC

## 2011-09-03 MED ORDER — ONDANSETRON HCL 4 MG PO TABS: 4.0000 mg | ORAL_TABLET | Freq: Four times a day (QID) | ORAL | Status: DC | PRN

## 2011-09-03 MED ORDER — MIDAZOLAM HCL 5 MG/5ML IJ SOLN
INTRAMUSCULAR | Status: DC | PRN
  Administered 2011-09-03: 2 mg via INTRAVENOUS

## 2011-09-03 MED ORDER — HYDROMORPHONE HCL PF 1 MG/ML IJ SOLN
0.2500 mg | INTRAMUSCULAR | Status: DC | PRN
  Administered 2011-09-03 (×5): 0.5 mg via INTRAVENOUS
  Filled 2011-09-03: qty 1

## 2011-09-03 MED ORDER — GLYCOPYRROLATE 0.2 MG/ML IJ SOLN
INTRAMUSCULAR | Status: DC | PRN
  Administered 2011-09-03: .6 mg via INTRAVENOUS

## 2011-09-03 MED ORDER — METHOCARBAMOL 500 MG PO TABS
500.0000 mg | ORAL_TABLET | Freq: Four times a day (QID) | ORAL | Status: DC | PRN
  Administered 2011-09-03: 500 mg via ORAL
  Filled 2011-09-03: qty 1

## 2011-09-03 MED ORDER — MORPHINE SULFATE 2 MG/ML IJ SOLN: 1.0000 mg | INTRAMUSCULAR | Status: DC | PRN

## 2011-09-03 MED ORDER — MIDAZOLAM HCL 2 MG/2ML IJ SOLN: 0.5000 mg | Freq: Once | INTRAMUSCULAR | Status: AC | PRN

## 2011-09-03 MED ORDER — SENNOSIDES-DOCUSATE SODIUM 8.6-50 MG PO TABS: 1.0000 | ORAL_TABLET | Freq: Every evening | ORAL | Status: DC | PRN

## 2011-09-03 MED ORDER — METOCLOPRAMIDE HCL 5 MG/ML IJ SOLN
5.0000 mg | Freq: Three times a day (TID) | INTRAMUSCULAR | Status: DC | PRN
Start: 2011-09-03 — End: 2011-09-04
  Filled 2011-09-03: qty 2

## 2011-09-03 MED ORDER — SODIUM CHLORIDE 0.9 % IR SOLN
Status: DC | PRN
  Administered 2011-09-03: 1000 mL

## 2011-09-03 MED ORDER — PROMETHAZINE HCL 25 MG/ML IJ SOLN: 6.2500 mg | INTRAMUSCULAR | Status: DC | PRN

## 2011-09-03 MED ORDER — METOPROLOL TARTRATE 1 MG/ML IV SOLN
INTRAVENOUS | Status: DC | PRN
  Administered 2011-09-03: 1 mg via INTRAVENOUS

## 2011-09-03 MED ORDER — LACTATED RINGERS IV SOLN
INTRAVENOUS | Status: DC | PRN
  Administered 2011-09-03 (×2): via INTRAVENOUS

## 2011-09-03 MED ORDER — CEFAZOLIN SODIUM 1-5 GM-% IV SOLN
INTRAVENOUS | Status: DC | PRN
  Administered 2011-09-03: 2 g via INTRAVENOUS

## 2011-09-03 MED ORDER — ROCURONIUM BROMIDE 100 MG/10ML IV SOLN
INTRAVENOUS | Status: DC | PRN
  Administered 2011-09-03: 40 mg via INTRAVENOUS
  Administered 2011-09-03: 10 mg via INTRAVENOUS

## 2011-09-03 MED ORDER — MEPERIDINE HCL 25 MG/ML IJ SOLN: 6.2500 mg | INTRAMUSCULAR | Status: DC | PRN

## 2011-09-03 MED ORDER — ONDANSETRON HCL 4 MG/2ML IJ SOLN: 4.0000 mg | Freq: Four times a day (QID) | INTRAMUSCULAR | Status: DC | PRN

## 2011-09-03 MED ORDER — HYDROMORPHONE HCL PF 1 MG/ML IJ SOLN
INTRAMUSCULAR | Status: AC
  Administered 2011-09-03: 0.5 mg via INTRAVENOUS
  Filled 2011-09-03: qty 1

## 2011-09-03 MED ORDER — METOCLOPRAMIDE HCL 10 MG PO TABS
5.0000 mg | ORAL_TABLET | Freq: Three times a day (TID) | ORAL | Status: DC | PRN
Start: 2011-09-03 — End: 2011-09-04
  Administered 2011-09-03: 10 mg via ORAL
  Filled 2011-09-03: qty 2

## 2011-09-03 MED ORDER — KCL IN DEXTROSE-NACL 20-5-0.9 MEQ/L-%-% IV SOLN
INTRAVENOUS | Status: AC
  Filled 2011-09-03: qty 1000

## 2011-09-03 MED ORDER — OXYCODONE HCL 5 MG PO TABS
5.0000 mg | ORAL_TABLET | ORAL | Status: DC | PRN
  Administered 2011-09-04: 10 mg via ORAL
  Administered 2011-09-04: 5 mg via ORAL
  Administered 2011-09-04: 10 mg via ORAL
  Filled 2011-09-03: qty 1
  Filled 2011-09-03 (×2): qty 2

## 2011-09-03 MED ORDER — METHOCARBAMOL 100 MG/ML IJ SOLN: 500.0000 mg | Freq: Four times a day (QID) | INTRAVENOUS | Status: DC | PRN

## 2011-09-03 MED ORDER — PROPOFOL 10 MG/ML IV EMUL
INTRAVENOUS | Status: DC | PRN
  Administered 2011-09-03: 140 mg via INTRAVENOUS

## 2011-09-03 SURGICAL SUPPLY — 63 items
BANDAGE ELASTIC 4 VELCRO ST LF (GAUZE/BANDAGES/DRESSINGS) ×2 IMPLANT
BANDAGE GAUZE ELAST BULKY 4 IN (GAUZE/BANDAGES/DRESSINGS) IMPLANT
BIT DRILL 3.5 QC 155 (BIT) ×1 IMPLANT
BIT DRILL QC 2.7 6.3IN  SHORT (BIT) ×1
BIT DRILL QC 2.7 6.3IN SHORT (BIT) IMPLANT
BLADE SURG 10 STRL SS (BLADE) ×2 IMPLANT
BNDG CMPR 9X4 STRL LF SNTH (GAUZE/BANDAGES/DRESSINGS)
BNDG ESMARK 4X9 LF (GAUZE/BANDAGES/DRESSINGS) IMPLANT
CLOTH BEACON ORANGE TIMEOUT ST (SAFETY) ×2 IMPLANT
CORDS BIPOLAR (ELECTRODE) ×2 IMPLANT
COVER SURGICAL LIGHT HANDLE (MISCELLANEOUS) ×2 IMPLANT
CUFF TOURNIQUET SINGLE 18IN (TOURNIQUET CUFF) ×2 IMPLANT
CUFF TOURNIQUET SINGLE 24IN (TOURNIQUET CUFF) IMPLANT
DRAIN TLS ROUND 10FR (DRAIN) IMPLANT
DRAPE INCISE IOBAN 66X45 STRL (DRAPES) IMPLANT
DRAPE OEC MINIVIEW 54X84 (DRAPES) IMPLANT
DRAPE U-SHAPE 47X51 STRL (DRAPES) ×2 IMPLANT
DRSG PAD ABDOMINAL 8X10 ST (GAUZE/BANDAGES/DRESSINGS) ×1 IMPLANT
DURAPREP 26ML APPLICATOR (WOUND CARE) ×2 IMPLANT
ELECT REM PT RETURN 9FT ADLT (ELECTROSURGICAL) ×2
ELECTRODE REM PT RTRN 9FT ADLT (ELECTROSURGICAL) ×1 IMPLANT
FACESHIELD LNG OPTICON STERILE (SAFETY) ×2 IMPLANT
GAUZE KERLIX 2  STERILE LF (GAUZE/BANDAGES/DRESSINGS) ×1 IMPLANT
GAUZE XEROFORM 1X8 LF (GAUZE/BANDAGES/DRESSINGS) ×1 IMPLANT
GLOVE BIO SURGEON ST LM GN SZ9 (GLOVE) ×2 IMPLANT
GLOVE BIOGEL PI IND STRL 8 (GLOVE) ×1 IMPLANT
GLOVE BIOGEL PI INDICATOR 8 (GLOVE) ×1
GLOVE SURG ORTHO 8.0 STRL STRW (GLOVE) ×2 IMPLANT
GOWN PREVENTION PLUS LG XLONG (DISPOSABLE) IMPLANT
GOWN PREVENTION PLUS XLARGE (GOWN DISPOSABLE) ×2 IMPLANT
GOWN STRL NON-REIN LRG LVL3 (GOWN DISPOSABLE) ×4 IMPLANT
KIT BASIN OR (CUSTOM PROCEDURE TRAY) ×2 IMPLANT
KIT ROOM TURNOVER OR (KITS) ×2 IMPLANT
MANIFOLD NEPTUNE II (INSTRUMENTS) ×2 IMPLANT
NEEDLE 22X1 1/2 (OR ONLY) (NEEDLE) IMPLANT
NS IRRIG 1000ML POUR BTL (IV SOLUTION) ×2 IMPLANT
PACK ORTHO EXTREMITY (CUSTOM PROCEDURE TRAY) ×2 IMPLANT
PAD ARMBOARD 7.5X6 YLW CONV (MISCELLANEOUS) ×2 IMPLANT
PAD CAST 3X4 CTTN HI CHSV (CAST SUPPLIES) ×1 IMPLANT
PAD CAST 4YDX4 CTTN HI CHSV (CAST SUPPLIES) ×1 IMPLANT
PADDING CAST COTTON 3X4 STRL (CAST SUPPLIES) ×2
PADDING CAST COTTON 4X4 STRL (CAST SUPPLIES) ×2
PADDING WEBRIL 4 STERILE (GAUZE/BANDAGES/DRESSINGS) ×1 IMPLANT
PLATE 6HOLE (Plate) ×1 IMPLANT
SCREW 3.5X14MM (Screw) ×5 IMPLANT
SCREW NL 3.5X18 (Screw) ×1 IMPLANT
SCREW NON LOCK 3.5X12MM (Screw) ×2 IMPLANT
SPONGE GAUZE 4X4 12PLY (GAUZE/BANDAGES/DRESSINGS) ×1 IMPLANT
SPONGE LAP 4X18 X RAY DECT (DISPOSABLE) ×4 IMPLANT
STAPLER VISISTAT 35W (STAPLE) IMPLANT
STRIP CLOSURE SKIN 1/2X4 (GAUZE/BANDAGES/DRESSINGS) ×2 IMPLANT
SUCTION FRAZIER TIP 10 FR DISP (SUCTIONS) ×2 IMPLANT
SUT ETHILON 3 0 PS 1 (SUTURE) IMPLANT
SUT PROLENE 3 0 PS 1 (SUTURE) IMPLANT
SUT VIC AB 2-0 CTB1 (SUTURE) IMPLANT
SUT VIC AB 3-0 X1 27 (SUTURE) IMPLANT
SYR CONTROL 10ML LL (SYRINGE) IMPLANT
SYSTEM CHEST DRAIN TLS 7FR (DRAIN) IMPLANT
TOWEL OR 17X24 6PK STRL BLUE (TOWEL DISPOSABLE) ×2 IMPLANT
TOWEL OR 17X26 10 PK STRL BLUE (TOWEL DISPOSABLE) ×2 IMPLANT
TUBE CONNECTING 12X1/4 (SUCTIONS) ×2 IMPLANT
WATER STERILE IRR 1000ML POUR (IV SOLUTION) ×2 IMPLANT
YANKAUER SUCT BULB TIP NO VENT (SUCTIONS) IMPLANT

## 2011-09-03 NOTE — Anesthesia Postprocedure Evaluation (Signed)
  Anesthesia Post-op Note  Patient: Cynthia Welch  Procedure(s) Performed:  OPEN REDUCTION INTERNAL FIXATION (ORIF) ULNAR FRACTURE  Patient Location: PACU  Anesthesia Type: General  Level of Consciousness: awake  Airway and Oxygen Therapy: Patient Spontanous Breathing  Post-op Pain: mild  Post-op Assessment: Post-op Vital signs reviewed and Patient's Cardiovascular Status Stable  Post-op Vital Signs: stable  Complications: No apparent anesthesia complications

## 2011-09-03 NOTE — Anesthesia Preprocedure Evaluation (Addendum)
Anesthesia Evaluation  Patient identified by MRN, date of birth, ID band Patient awake    Reviewed: Allergy & Precautions, H&P , NPO status , Patient's Chart, lab work & pertinent test results  Airway Mallampati: II TM Distance: >3 FB Neck ROM: Full    Dental  (+) Upper Dentures, Dental Advisory Given, Loose and Missing   Pulmonary Current Smoker,  clear to auscultation        Cardiovascular hypertension, Regular Normal    Neuro/Psych Seizures -,     GI/Hepatic   Endo/Other    Renal/GU      Musculoskeletal   Abdominal   Peds  Hematology   Anesthesia Other Findings   Reproductive/Obstetrics                          Anesthesia Physical Anesthesia Plan  ASA: III  Anesthesia Plan: General   Post-op Pain Management:    Induction: Intravenous  Airway Management Planned: Oral ETT  Additional Equipment:   Intra-op Plan:   Post-operative Plan: Extubation in OR  Informed Consent: I have reviewed the patients History and Physical, chart, labs and discussed the procedure including the risks, benefits and alternatives for the proposed anesthesia with the patient or authorized representative who has indicated his/her understanding and acceptance.   Dental advisory given  Plan Discussed with: CRNA  Anesthesia Plan Comments:         Anesthesia Quick Evaluation

## 2011-09-03 NOTE — Transfer of Care (Signed)
Immediate Anesthesia Transfer of Care Note  Patient: Cynthia Welch  Procedure(s) Performed:  OPEN REDUCTION INTERNAL FIXATION (ORIF) ULNAR FRACTURE  Patient Location: PACU  Anesthesia Type: General  Level of Consciousness: awake, alert  and oriented  Airway & Oxygen Therapy: Patient Spontanous Breathing and Patient connected to nasal cannula oxygen  Post-op Assessment: Report given to PACU RN, Post -op Vital signs reviewed and stable and Patient moving all extremities  Post vital signs: Reviewed and stable  Complications: No apparent anesthesia complications

## 2011-09-03 NOTE — Op Note (Signed)
NAMEKAZIYAH, PARKISON           ACCOUNT NO.:  [id number]  MEDICAL RECORD NO.:  [id number]  LOCATION:  5005                         FACILITY:  [facility]  PHYSICIAN:  Burnard Bunting, M.D.    DATE OF BIRTH:  Apr 15, 1961  DATE OF PROCEDURE:  09/03/2011 DATE OF DISCHARGE:                              OPERATIVE REPORT   PREOPERATIVE DIAGNOSIS:  Displaced right ulnar shaft fracture.  POSTOPERATIVE DIAGNOSIS:  Displaced right ulnar shaft fracture.  PROCEDURE:  Open reduction and internal fixation of right ulnar shaft fracture.  SURGEON:  Burnard Bunting, MD  ASSISTANT:  None.  ANESTHESIA:  General endotracheal.  ESTIMATED BLOOD LOSS:  Minimal.  INDICATIONS:  Rayne is a patient who injured her right arm at work. She presents now for operative management of displaced fracture after explanation of risks and benefits.  PROCEDURE IN DETAIL:  The patient was brought to operating room where general endotracheal anesthesia was induced.  Preoperative antibiotics administered.  Right arm was prescribed with alcohol and Betadine which was allowed to air dry.  Prepped with DuraPrep solution, draped in a sterile manner.  Time-out was called.  Collier Flowers was used to cover the operative field.  Arm was elevated and exsanguinated with Esmarch. Tourniquet was inflated.  The incision was made centered over the fracture extending about 4 cm proximal and distal.  Skin and subcutaneous tissues were sharply divided.  The bone was encountered. Care was taken to avoid injury to any crossing sensory branches.  Care was also taken to avoid injury to the ulnar neurovascular bundle.  The fracture was visualized, irrigated, and reduced.  Lag screw was placed from ulnar to radial, 3.5 plate was then placed on the dorsal aspect of the ulna with good fixation achieved 6 cortices proximal, 4 cortices distal in addition to the lag screw.  Stable fixation was present.  The tourniquet was released after 37 minutes.   Thorough irrigation was performed.  Incision was closed using interrupted inverted 2-0 Vicryl suture and running 3-0 pullout Prolene.  The patient tolerated the procedure well without immediate complication.  Soft dressing was applied.    Burnard Bunting, M.D.    GSD/MEDQ  D:  09/03/2011  T:  09/03/2011  Job:  [id number]

## 2011-09-03 NOTE — Preoperative (Signed)
Beta Blockers   Reason not to administer Beta Blockers:Not Applicable 

## 2011-09-03 NOTE — Anesthesia Procedure Notes (Signed)
Procedures

## 2011-09-03 NOTE — Brief Op Note (Signed)
09/02/2011 - 09/03/2011  4:55 PM  PATIENT:  Cynthia Welch  50 y.o. female  PRE-OPERATIVE DIAGNOSIS:  right ulnar fracture  POST-OPERATIVE DIAGNOSIS:  right ulnar fracture  PROCEDURE:  Procedure(s): OPEN REDUCTION INTERNAL FIXATION (ORIF) ULNAR FRACTURE  SURGEON:  Surgeon(s): Cammy Copa  ASSISTANT:   ANESTHESIA:   general  EBL: 20 ml    Total I/O In: 1000 [I.V.:1000] Out: -   BLOOD ADMINISTERED: none  DRAINS: none   LOCAL MEDICATIONS USED:  none  SPECIMEN:  No Specimen  COUNTS:  YES  TOURNIQUET:  * Missing tourniquet times found for documented tourniquets in log:  8798 *37 min at 250  DICTATION: .Other Dictation: Dictation Number [id number]  PLAN OF CARE: Admit for overnight observation  PATIENT DISPOSITION:  PACU - hemodynamically stable

## 2011-09-04 DIAGNOSIS — S52209A Unspecified fracture of shaft of unspecified ulna, initial encounter for closed fracture: Secondary | ICD-10-CM

## 2011-09-04 MED ORDER — OXYCODONE HCL 5 MG PO TABS: 5.0000 mg | ORAL_TABLET | ORAL | Status: DC | PRN

## 2011-09-04 MED ORDER — OXYCODONE HCL 5 MG PO TABS: 5.0000 mg | ORAL_TABLET | ORAL | Status: AC | PRN

## 2011-09-04 NOTE — Progress Notes (Signed)
09/03/11 2100: pt expressed concern re: Workman's Comp claim and her inability to work and therefore have money to sustain household at present. Encouraged her to speak with MD and ask for Case Manager Consult. Pt verbalized understanding.

## 2011-09-04 NOTE — Progress Notes (Signed)
Occupational Therapy Evaluation Patient Details Name: Cynthia Welch MRN: [id number] DOB: 1961/02/03 Today's Date: 09/04/2011 EV1  [id number]   Problem List:  Patient Active Problem List  Diagnoses  . FIBROIDS, UTERUS  . HYPERCHOLESTEROLEMIA  . LEUKOCYTOPENIA UNSPECIFIED  . TOBACCO ABUSE  . HYPERTENSION, BENIGN ESSENTIAL  . DENTAL CARIES  . GANGLION CYST, WRIST, LEFT  . SEIZURE DISORDER  . SKIN RASH  . LIVER FUNCTION TESTS, ABNORMAL    Past Medical History: No past medical history on file. Past Surgical History: No past surgical history on file.  OT Assessment/Plan/Recommendation OT Assessment Clinical Impression Statement: Pt s/p ORIF of R ulna with minor deficits in areas of elbow extension and finger extension.  Pt understands all exercises and precautions.  No need for further acute OT. OT Recommendation/Assessment: Patient does not need any further OT services OT Goals    OT Evaluation Precautions/Restrictions  Precautions Precautions: Other (comment) (No lifting with RUE) Required Braces or Orthoses: Yes Other Brace/Splint: cast on R lower arm Restrictions Weight Bearing Restrictions: Yes RUE Weight Bearing: Non weight bearing Prior Functioning Home Living Lives With: Significant other Receives Help From: Family Type of Home: Apartment Home Layout: One level Home Access: Stairs to enter Entrance Stairs-Rails: Right Entrance Stairs-Number of Steps: 3 Bathroom Shower/Tub: Tub/shower unit;Door [employer]: Standard Prior Function Level of Independence: Independent with basic ADLs;Independent with homemaking with ambulation Able to Take Stairs?: Yes Driving: Yes Vocation: Full time employment 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 ADL ADL Eating/Feeding: Independent Where Assessed - Eating/Feeding: Edge of bed Grooming: Performed;Wash/dry face;Teeth care;Brushing hair;Independent Where Assessed - Grooming:  Standing at sink Upper Body Bathing: Simulated;Chest;Right arm;Left arm;Abdomen;Set up Where Assessed - Upper Body Bathing: Standing at sink Lower Body Bathing: Simulated;Set up Where Assessed - Lower Body Bathing: Standing at sink Upper Body Dressing: Independent Where Assessed - Upper Body Dressing: Standing Lower Body Dressing: Independent Where Assessed - Lower Body Dressing: Standing Toilet Transfer: Modified independent Toilet Transfer Method: Ambulating Toilet Transfer Equipment: Grab bars Toileting - Clothing Manipulation: Independent Where Assessed - Toileting Clothing Manipulation: Standing Toileting - Hygiene: Independent;Other (comment) (using non dominant hand) Where Assessed - Toileting Hygiene: Standing Tub/Shower Transfer: Not assessed Tub/Shower Transfer Method: Not assessed ADL Comments: Pt fairly independent with adls.  Compensates well with broken arm. Vision/Perception    Cognition   Sensation/Coordination Sensation Light Touch: Appears Intact Stereognosis: Appears Intact Hot/Cold: Appears Intact Proprioception: Appears Intact Coordination Gross Motor Movements are Fluid and Coordinated: Yes Fine Motor Movements are Fluid and Coordinated: No Extremity Assessment RUE Assessment RUE Assessment: Exceptions to [facility] RUE AROM (degrees) Overall AROM Right Upper Extremity: Deficits RUE Overall AROM Comments: Pt limited by cast on R forearm.  Pt with -5 degrees elbow extension. LUE Assessment LUE Assessment: Within Functional Limits Mobility  Bed Mobility Bed Mobility: Yes Supine to Sit: 7: Independent Transfers Transfers: Yes Sit to Stand: 7: Independent Stand to Sit: 7: Independent Exercises Other Exercises Other Exercises: Pt given basic shoulder flexion exercises to avoid frozen shoulder from holding arm in sling position.  Pt given exercises for elbow flexion/extension and finger flexion/extension. End of Session OT - End of Session Activity  Tolerance: Patient tolerated treatment well Patient left: in bed;with call bell in reach General Behavior During Session: [facility] for tasks performed Cognition: [facility] for tasks performed   Hope Budds  [phone] 09/04/2011, 9:54 AM

## 2011-09-04 NOTE — Discharge Summary (Signed)
PATIENT ID:      Cynthia Welch  MRN:     [id number] DOB/AGE:    1961-07-16 / 50 y.o.     DISCHARGE SUMMARY  ADMISSION DATE:    09/02/2011 DISCHARGE DATE:    ADMISSION DIAGNOSIS: right ulnar fracture  DISCHARGE DIAGNOSIS:  right ulnar fracture    ADDITIONAL DIAGNOSIS: Active Problems:  * No active hospital problems. *   No past medical history on file.  PROCEDURE: Procedure(s): OPEN REDUCTION INTERNAL FIXATION (ORIF) ULNAR FRACTURE on 09/02/2011 - 09/03/2011  CONSULTS:     HISTORY:   See H&P in chart  HOSPITAL COURSE:  Cynthia Welch is a 50 y.o. admitted on 09/02/2011 and found to have a diagnosis of right ulnar fracture.  After appropriate laboratory studies were obtained  they were taken to the operating room on 09/02/2011 - 09/03/2011 and underwent Procedure(s): OPEN REDUCTION INTERNAL FIXATION (ORIF) ULNAR FRACTURE.   They were given perioperative antibiotics:  Anti-infectives     Start     Dose/Rate Route Frequency Ordered Stop   09/03/11 2000   ceFAZolin (ANCEF) IVPB 1 g/50 mL premix        1 g 100 mL/hr over 30 Minutes Intravenous Every 6 hours 09/03/11 1957 09/04/11 0841   09/02/11 2306   ceFAZolin (ANCEF) IVPB 2 g/50 mL premix        2 g 100 mL/hr over 30 Minutes Intravenous 60 min pre-op 09/02/11 2306 09/03/11 1343        .  { The remainder of the hospital course was dedicated to ambulation and strengthening.   The patient was discharged on 1 Day Post-Op in stable condition.   DIAGNOSTIC STUDIES: Recent vital signs: Patient Vitals for the past 24 hrs:  BP Temp Temp src Pulse Resp SpO2  09/04/11 0635 142/85 mmHg 98.7 F (37.1 C) Oral 65  18  100 %  09/04/11 0230 136/77 mmHg 98.5 F (36.9 C) Oral 72  16  100 %  09/03/11 2225 175/107 mmHg 98 F (36.7 C) Oral 65  16  100 %  09/03/11 2120 176/108 mmHg 97.5 F (36.4 C) Oral 63  16  100 %  09/03/11 1845 169/103 mmHg - Oral 68  20  100 %  09/03/11 1814 - - - 68  16  100 %  09/03/11 1813 - - - 66   19  100 %  09/03/11 1812 - - - 68  17  100 %  09/03/11 1811 - - - 69  17  100 %  09/03/11 1810 - - - 66  18  100 %  09/03/11 1809 - - - 67  18  99 %  09/03/11 1808 - - - 79  20  96 %  09/03/11 1807 - - - 67  22  100 %  09/03/11 1806 - - - 64  15  100 %  09/03/11 1805 - - - 64  13  100 %  09/03/11 1804 - - - 66  16  100 %  09/03/11 1803 - - - 64  15  100 %  09/03/11 1802 - - - 67  16  100 %  09/03/11 1801 183/97 mmHg 97.9 F (36.6 C) - 65  14  100 %  09/03/11 1800 - - - 67  16  100 %  09/03/11 1759 - - - 67  20  100 %  09/03/11 1758 - - - 65  12  100 %  09/03/11 1757 - - - 67  13  100 %  09/03/11 1756 - - - 65  14  100 %  09/03/11 1755 - - - 64  13  100 %  09/03/11 1754 - - - 65  11  100 %  09/03/11 1753 - - - 65  13  100 %  09/03/11 1752 - - - 63  15  100 %  09/03/11 1751 - - - 68  15  100 %  09/03/11 1750 - - - 66  15  100 %  09/03/11 1749 - - - 64  13  100 %  09/03/11 1748 191/101 mmHg - - 63  11  100 %  09/03/11 1747 - - - 65  16  100 %  09/03/11 1746 - - - 66  17  100 %  09/03/11 1745 - - - 66  14  100 %  09/03/11 1744 - - - 69  14  100 %  09/03/11 1743 - - - 67  15  100 %  09/03/11 1742 - - - 68  17  100 %  09/03/11 1741 - - - 66  13  100 %  09/03/11 1740 - - - 67  14  100 %  09/03/11 1739 - - - 70  14  100 %  09/03/11 1738 - - - 68  13  100 %  09/03/11 1737 - - - 66  12  100 %  09/03/11 1736 - - - 68  17  100 %  09/03/11 1735 - - - 69  16  100 %  09/03/11 1734 - - - 66  15  100 %  09/03/11 1733 - - - 67  14  100 %  09/03/11 1732 197/105 mmHg - - 65  15  100 %  09/03/11 1731 - - - 68  15  100 %  09/03/11 1730 182/111 mmHg - - 71  15  100 %  09/03/11 1729 - - - 71  15  100 %  09/03/11 1728 - - - 69  15  100 %  09/03/11 1727 - - - 70  16  100 %  09/03/11 1726 - - - 70  17  100 %  09/03/11 1725 - - - 66  14  100 %  09/03/11 1724 - - - 67  15  100 %  09/03/11 1723 - - - 66  13  100 %  09/03/11 1722 - - - 68  19  100 %  09/03/11 1721 - - - 67  12  100 %  09/03/11  1720 - - - 65  13  100 %  09/03/11 1719 - - - 66  11  100 %  09/03/11 1718 - - - 66  14  100 %  09/03/11 1717 - - - 68  13  100 %  09/03/11 1716 176/103 mmHg - - 68  15  100 %  09/03/11 1715 - - - 69  17  100 %  09/03/11 1714 - - - 70  17  100 %  09/03/11 1713 - - - 70  16  100 %  09/03/11 1712 - - - 71  17  100 %  09/03/11 1711 - - - 71  13  100 %  09/03/11 1710 - - - 71  14  100 %  09/03/11 1709 - - - 71  16  100 %  09/03/11 1708 - - - 72  15  100 %  09/03/11 1707 - - -  72  18  100 %  09/03/11 1706 - - - 70  12  100 %  09/03/11 1705 - - - 70  13  100 %  09/03/11 1704 - - - 70  12  100 %  09/03/11 1703 - - - 72  13  100 %  09/03/11 1702 - - - 71  14  99 %  09/03/11 1701 137/86 mmHg - - 69  - 100 %  09/03/11 1658 - 97.1 F (36.2 C) - 72  14  100 %     Recent laboratory studies:  [facility] 09/02/11 2144  WBC 5.4  HGB 12.3  HCT 36.9  PLT 246    Basename 09/02/11 2144  NA 141  K 2.9*  CL 103  CO2 28  BUN 7  CREATININE 0.62  GLUCOSE 77  CALCIUM 9.0   No results found for this basename: INR, PROTIME     Recent Radiographic Studies :  Dg Chest 2 View  09/02/2011  *RADIOLOGY REPORT*  Clinical Data: Preop for right forearm fracture.  CHEST - 2 VIEW  Comparison: None  Findings: The cardiac silhouette, mediastinal and hilar contours are within normal limits.  The lungs are clear.  No pleural effusion.  The bony thorax is intact.  IMPRESSION: No acute cardiopulmonary findings.  Original Report Authenticated By: P. Loralie Champagne, M.D.   Dg Forearm Right Portable  09/03/2011  *RADIOLOGY REPORT*  Clinical Data: Postop ORIF right forearm  RIGHT FOREARM - 2 VIEW  Comparison: None.  Findings: Interval fixation of right ulnar fracture with dynamic plate and six cortical screws.  Normal alignment.  IMPRESSION: Internal fixation of right ulnar fracture.  Original Report Authenticated By: Genevive Bi, M.D.   Dg Forearm Right  09/03/2011  *RADIOLOGY REPORT*  Clinical Data:  Radius fracture.  RIGHT FOREARM - 2 VIEW  Comparison: None.  Findings: Two C-arm images demonstrate the patient has undergone open reduction and internal fixation of the mid shaft right radius fracture.  Side plate and multiple screws are in place.  IMPRESSION: Open reduction and internal fixation of radius fracture.  Original Report Authenticated By: Gwynn Burly, M.D.   Dg C-arm 1-60 Min  09/03/2011  CLINICAL DATA: surgery   C-ARM 1-60 MINUTES  Fluoroscopy was utilized by the requesting physician.  No radiographic  interpretation.      DISCHARGE INSTRUCTIONS: Discharge Orders    Future Orders Please Complete By Expires   Diet - low sodium heart healthy      Call MD / Call 911      Comments:   If you experience chest pain or shortness of breath, CALL 911 and be transported to the hospital emergency room.  If you develope a fever above 101 F, pus (white drainage) or increased drainage or redness at the wound, or calf pain, call your surgeon's office.   Constipation Prevention      Comments:   Drink plenty of fluids.  Prune juice may be helpful.  You may use a stool softener, such as Colace (over the counter) 100 mg twice a day.  Use MiraLax (over the counter) for constipation as needed.   Increase activity slowly as tolerated      Weight Bearing as taught in Physical Therapy      Comments:   Use a walker or crutches as instructed.      DISCHARGE MEDICATIONS:   Current Discharge Medication List    START taking these medications   Details  oxyCODONE (OXY IR/ROXICODONE)  5 MG immediate release tablet Take 1-2 tablets (5-10 mg total) by mouth every 3 (three) hours as needed. Qty: 50 tablet, Refills: 0        FOLLOW UP VISIT:   Follow-up Information    Follow up with Tammy Wickliffe SCOTT. Make an appointment in 7 days.   Contact information:   [facility] [address] [place] [address] [phone]          DISCHARGE EX:BMWU IN  Good Condition    Idrees Quam SCOTT 09/04/2011, 11:27 AM

## 2011-09-04 NOTE — Progress Notes (Signed)
Pt D/Cd to home, scripts given. 

## 2011-09-12 ENCOUNTER — Encounter (HOSPITAL_COMMUNITY): Payer: Self-pay | Admitting: Orthopedic Surgery

## 2011-12-07 ENCOUNTER — Encounter (HOSPITAL_COMMUNITY): Payer: Self-pay | Admitting: Pharmacy Technician

## 2011-12-08 ENCOUNTER — Other Ambulatory Visit (HOSPITAL_COMMUNITY): Payer: Self-pay | Admitting: Orthopedic Surgery

## 2011-12-09 MED ORDER — MIDAZOLAM HCL 2 MG/2ML IJ SOLN: 0.5000 mg | Freq: Once | INTRAMUSCULAR | Status: DC | PRN

## 2011-12-09 MED ORDER — FENTANYL CITRATE 0.05 MG/ML IJ SOLN: 50.0000 ug | INTRAMUSCULAR | Status: DC | PRN

## 2011-12-09 NOTE — Pre-Procedure Instructions (Signed)
20 Kaleeya Hancock  12/09/2011      Your procedure is scheduled on: Thursday, Feb. 21st.   Report to [facility] at 8:00 AM.   Call this number if you have problems the morning of surgery: [phone]   Remember:   Do not eat food:After Midnight Wednesday.   May have clear liquids: up to 4 Hours before arrival time--4:00 AM  Clear liquids include soda, tea, black coffee, apple or grape juice, broth.   Take these medicines the morning of surgery with A SIP OF WATER: Oxycodone   Do not wear jewelry, make-up or nail polish.    Do not wear lotions, powders, or perfumes.              You may wear deodorant.               Do not shave 48 hours prior to surgery.   Do not bring valuables to the hospital.  Contacts, dentures or bridgework may not be worn into surgery.  Leave suitcase in the car. After surgery it may be brought to your room.  For patients admitted to the hospital, checkout time is 11:00 AM the day of discharge.   Patients discharged the day of surgery will not be allowed to drive home, and you             Need someone to stay with you for the first 24 hours.   Name and phone number of your driverCarolyne Fiscal  ---- [phone]   Special Instructions: CHG Shower Use Special Wash: 1/2 bottle night before surgery and 1/2 bottle morning of surgery.   Please read over the following fact sheets that you were given: Pain Booklet, MRSA Information and Surgical Site Infection Prevention

## 2011-12-11 ENCOUNTER — Encounter (HOSPITAL_COMMUNITY)
Admission: RE | Admit: 2011-12-11 | Discharge: 2011-12-11 | Disposition: A | Discharge status: Home / Self Care | Payer: Worker's Compensation | Source: Ambulatory Visit | Attending: Orthopedic Surgery | Admitting: Orthopedic Surgery

## 2011-12-11 ENCOUNTER — Encounter (HOSPITAL_COMMUNITY): Payer: Self-pay

## 2011-12-11 HISTORY — DX: Benign neoplasm of connective and other soft tissue, unspecified: D21.9

## 2011-12-11 HISTORY — DX: Unspecified convulsions: R56.9

## 2011-12-11 HISTORY — DX: Other specified postprocedural states: Z98.890

## 2011-12-11 HISTORY — DX: Other specified postprocedural states: R11.2

## 2011-12-11 HISTORY — DX: Nausea with vomiting, unspecified: R11.2

## 2011-12-11 LAB — DIFFERENTIAL
Eosinophils Absolute: 0.3 10*3/uL (ref 0.0–0.7)
Eosinophils Relative: 5 % (ref 0–5)
Lymphs Abs: 1.8 10*3/uL (ref 0.7–4.0)
Monocytes Absolute: 0.6 10*3/uL (ref 0.1–1.0)
Monocytes Relative: 12 % (ref 3–12)

## 2011-12-11 LAB — BASIC METABOLIC PANEL
CO2: 28 mEq/L (ref 19–32)
Calcium: 10 mg/dL (ref 8.4–10.5)
Chloride: 102 mEq/L (ref 96–112)
Glucose, Bld: 85 mg/dL (ref 70–99)
Potassium: 4.2 mEq/L (ref 3.5–5.1)
Sodium: 137 mEq/L (ref 135–145)

## 2011-12-11 LAB — SURGICAL PCR SCREEN
MRSA, PCR: NEGATIVE
Staphylococcus aureus: NEGATIVE

## 2011-12-11 LAB — URINALYSIS, MICROSCOPIC ONLY
Glucose, UA: NEGATIVE mg/dL
Hgb urine dipstick: NEGATIVE
Protein, ur: NEGATIVE mg/dL
Specific Gravity, Urine: 1.002 — ABNORMAL LOW (ref 1.005–1.030)

## 2011-12-11 LAB — CBC
HCT: 40.2 % (ref 36.0–46.0)
Hemoglobin: 14 g/dL (ref 12.0–15.0)
MCH: 33.9 pg (ref 26.0–34.0)
MCV: 97.3 fL (ref 78.0–100.0)
RBC: 4.13 MIL/uL (ref 3.87–5.11)

## 2011-12-11 MED ORDER — CHLORHEXIDINE GLUCONATE 4 % EX LIQD: 60.0000 mL | Freq: Once | CUTANEOUS | Status: DC

## 2011-12-13 MED ORDER — CEFAZOLIN SODIUM-DEXTROSE 2-3 GM-% IV SOLR
2.0000 g | INTRAVENOUS | Status: AC
  Administered 2011-12-14: 2 g via INTRAVENOUS
  Filled 2011-12-13: qty 50

## 2011-12-13 MED ORDER — CHLORHEXIDINE GLUCONATE 4 % EX LIQD
60.0000 mL | Freq: Once | CUTANEOUS | Status: DC
  Filled 2011-12-13: qty 60

## 2011-12-14 ENCOUNTER — Encounter (HOSPITAL_COMMUNITY): Payer: Self-pay | Admitting: Anesthesiology

## 2011-12-14 ENCOUNTER — Encounter (HOSPITAL_COMMUNITY): Payer: Self-pay | Admitting: Surgery

## 2011-12-14 ENCOUNTER — Ambulatory Visit (HOSPITAL_COMMUNITY): Payer: Worker's Compensation | Admitting: Anesthesiology

## 2011-12-14 ENCOUNTER — Inpatient Hospital Stay (HOSPITAL_COMMUNITY)
Admission: RE | Admit: 2011-12-14 | Discharge: 2011-12-15 | DRG: 512 | Disposition: A | Discharge status: Home / Self Care | Payer: Worker's Compensation | Source: Ambulatory Visit | Attending: Orthopedic Surgery | Admitting: Orthopedic Surgery

## 2011-12-14 ENCOUNTER — Encounter (HOSPITAL_COMMUNITY)
Admission: RE | Disposition: A | Discharge status: Home / Self Care | Payer: Self-pay | Source: Ambulatory Visit | Attending: Orthopedic Surgery

## 2011-12-14 ENCOUNTER — Ambulatory Visit (HOSPITAL_COMMUNITY): Payer: Worker's Compensation

## 2011-12-14 DIAGNOSIS — S52209A Unspecified fracture of shaft of unspecified ulna, initial encounter for closed fracture: Secondary | ICD-10-CM

## 2011-12-14 DIAGNOSIS — F17200 Nicotine dependence, unspecified, uncomplicated: Secondary | ICD-10-CM | POA: Diagnosis present

## 2011-12-14 DIAGNOSIS — IMO0002 Reserved for concepts with insufficient information to code with codable children: Principal | ICD-10-CM | POA: Diagnosis present

## 2011-12-14 HISTORY — PX: ORIF ULNAR FRACTURE: SHX5417

## 2011-12-14 SURGERY — OPEN REDUCTION INTERNAL FIXATION (ORIF) ULNAR FRACTURE
Anesthesia: General | Site: Arm Lower | Laterality: Right | Wound class: Clean

## 2011-12-14 MED ORDER — ONDANSETRON HCL 4 MG PO TABS: 4.0000 mg | ORAL_TABLET | Freq: Four times a day (QID) | ORAL | Status: DC | PRN

## 2011-12-14 MED ORDER — GLYCOPYRROLATE 0.2 MG/ML IJ SOLN
INTRAMUSCULAR | Status: DC | PRN
  Administered 2011-12-14: .4 mg via INTRAVENOUS

## 2011-12-14 MED ORDER — SODIUM CHLORIDE 0.9 % IJ SOLN: 9.0000 mL | INTRAMUSCULAR | Status: DC | PRN

## 2011-12-14 MED ORDER — ONDANSETRON HCL 4 MG/2ML IJ SOLN
INTRAMUSCULAR | Status: DC | PRN
  Administered 2011-12-14 (×2): 4 mg via INTRAVENOUS

## 2011-12-14 MED ORDER — ONDANSETRON HCL 4 MG/2ML IJ SOLN: 4.0000 mg | Freq: Four times a day (QID) | INTRAMUSCULAR | Status: DC | PRN

## 2011-12-14 MED ORDER — HYDROMORPHONE 0.3 MG/ML IV SOLN
INTRAVENOUS | Status: DC
  Administered 2011-12-14: 0.6 mg via INTRAVENOUS
  Administered 2011-12-14: 15:00:00 via INTRAVENOUS
  Administered 2011-12-15: 0.5 mg via INTRAVENOUS
  Administered 2011-12-15: 0.7 mg via INTRAVENOUS

## 2011-12-14 MED ORDER — METHOCARBAMOL 100 MG/ML IJ SOLN
500.0000 mg | Freq: Four times a day (QID) | INTRAVENOUS | Status: DC | PRN
  Administered 2011-12-14: 500 mg via INTRAVENOUS
  Filled 2011-12-14: qty 5

## 2011-12-14 MED ORDER — HYDROMORPHONE HCL PF 1 MG/ML IJ SOLN: 0.5000 mg | INTRAMUSCULAR | Status: DC | PRN

## 2011-12-14 MED ORDER — CEFAZOLIN SODIUM 1-5 GM-% IV SOLN
1.0000 g | Freq: Three times a day (TID) | INTRAVENOUS | Status: AC
  Administered 2011-12-14 – 2011-12-15 (×3): 1 g via INTRAVENOUS
  Filled 2011-12-14 (×3): qty 50

## 2011-12-14 MED ORDER — PROPOFOL 10 MG/ML IV EMUL
INTRAVENOUS | Status: DC | PRN
  Administered 2011-12-14 (×3): 30 mg via INTRAVENOUS
  Administered 2011-12-14: 190 mg via INTRAVENOUS
  Administered 2011-12-14: 20 mg via INTRAVENOUS

## 2011-12-14 MED ORDER — FENTANYL CITRATE 0.05 MG/ML IJ SOLN
INTRAMUSCULAR | Status: DC | PRN
  Administered 2011-12-14 (×6): 50 ug via INTRAVENOUS
  Administered 2011-12-14: 100 ug via INTRAVENOUS
  Administered 2011-12-14 (×2): 50 ug via INTRAVENOUS

## 2011-12-14 MED ORDER — LABETALOL HCL 5 MG/ML IV SOLN
INTRAVENOUS | Status: DC | PRN
  Administered 2011-12-14: 10 mg via INTRAVENOUS
  Administered 2011-12-14 (×2): 5 mg via INTRAVENOUS

## 2011-12-14 MED ORDER — LIDOCAINE HCL (CARDIAC) 20 MG/ML IV SOLN
INTRAVENOUS | Status: DC | PRN
  Administered 2011-12-14: 50 mg via INTRAVENOUS

## 2011-12-14 MED ORDER — HYDROMORPHONE HCL PF 1 MG/ML IJ SOLN
0.2500 mg | INTRAMUSCULAR | Status: DC | PRN
  Administered 2011-12-14 (×2): 0.5 mg via INTRAVENOUS

## 2011-12-14 MED ORDER — HYDROMORPHONE 0.3 MG/ML IV SOLN
INTRAVENOUS | Status: AC
  Filled 2011-12-14: qty 25

## 2011-12-14 MED ORDER — DIPHENHYDRAMINE HCL 50 MG/ML IJ SOLN
12.5000 mg | Freq: Four times a day (QID) | INTRAMUSCULAR | Status: DC | PRN
  Administered 2011-12-15: 12.5 mg via INTRAVENOUS
  Filled 2011-12-14: qty 1

## 2011-12-14 MED ORDER — MIDAZOLAM HCL 2 MG/2ML IJ SOLN: 1.0000 mg | INTRAMUSCULAR | Status: DC | PRN

## 2011-12-14 MED ORDER — FENTANYL CITRATE 0.05 MG/ML IJ SOLN: 50.0000 ug | INTRAMUSCULAR | Status: DC | PRN

## 2011-12-14 MED ORDER — METHOCARBAMOL 500 MG PO TABS
500.0000 mg | ORAL_TABLET | Freq: Four times a day (QID) | ORAL | Status: DC | PRN
  Filled 2011-12-14: qty 1

## 2011-12-14 MED ORDER — PROMETHAZINE HCL 25 MG/ML IJ SOLN: 6.2500 mg | INTRAMUSCULAR | Status: DC | PRN

## 2011-12-14 MED ORDER — LACTATED RINGERS IV SOLN
INTRAVENOUS | Status: DC | PRN
  Administered 2011-12-14 (×2): via INTRAVENOUS

## 2011-12-14 MED ORDER — NEOSTIGMINE METHYLSULFATE 1 MG/ML IJ SOLN
INTRAMUSCULAR | Status: DC | PRN
  Administered 2011-12-14: 3 mg via INTRAVENOUS

## 2011-12-14 MED ORDER — METOCLOPRAMIDE HCL 5 MG/ML IJ SOLN
5.0000 mg | Freq: Three times a day (TID) | INTRAMUSCULAR | Status: DC | PRN
  Filled 2011-12-14: qty 2

## 2011-12-14 MED ORDER — MIDAZOLAM HCL 5 MG/5ML IJ SOLN
INTRAMUSCULAR | Status: DC | PRN
  Administered 2011-12-14: 2 mg via INTRAVENOUS

## 2011-12-14 MED ORDER — LORAZEPAM 2 MG/ML IJ SOLN: 1.0000 mg | Freq: Once | INTRAMUSCULAR | Status: DC | PRN

## 2011-12-14 MED ORDER — DIPHENHYDRAMINE HCL 12.5 MG/5ML PO ELIX
12.5000 mg | ORAL_SOLUTION | Freq: Four times a day (QID) | ORAL | Status: DC | PRN
  Filled 2011-12-14: qty 5

## 2011-12-14 MED ORDER — METOCLOPRAMIDE HCL 10 MG PO TABS: 5.0000 mg | ORAL_TABLET | Freq: Three times a day (TID) | ORAL | Status: DC | PRN

## 2011-12-14 MED ORDER — ROCURONIUM BROMIDE 100 MG/10ML IV SOLN
INTRAVENOUS | Status: DC | PRN
  Administered 2011-12-14: 50 mg via INTRAVENOUS

## 2011-12-14 MED ORDER — NALOXONE HCL 0.4 MG/ML IJ SOLN: 0.4000 mg | INTRAMUSCULAR | Status: DC | PRN

## 2011-12-14 MED ORDER — LACTATED RINGERS IV SOLN
INTRAVENOUS | Status: DC
  Administered 2011-12-14: 10:00:00 via INTRAVENOUS

## 2011-12-14 MED ORDER — POTASSIUM CHLORIDE IN NACL 20-0.9 MEQ/L-% IV SOLN
INTRAVENOUS | Status: DC
  Administered 2011-12-14: 17:00:00 via INTRAVENOUS
  Filled 2011-12-14 (×3): qty 1000

## 2011-12-14 SURGICAL SUPPLY — 65 items
10 Hole Ulna Plate ×2 IMPLANT
BANDAGE ACE 4 STERILE (GAUZE/BANDAGES/DRESSINGS) ×2 IMPLANT
BANDAGE ELASTIC 4 VELCRO ST LF (GAUZE/BANDAGES/DRESSINGS) ×3 IMPLANT
BANDAGE GAUZE ELAST BULKY 4 IN (GAUZE/BANDAGES/DRESSINGS) IMPLANT
BIT DRILL 2.8X5 QR DISP (BIT) ×2 IMPLANT
BLADE SURG 10 STRL SS (BLADE) ×3 IMPLANT
BNDG CMPR 9X4 STRL LF SNTH (GAUZE/BANDAGES/DRESSINGS)
BNDG ESMARK 4X9 LF (GAUZE/BANDAGES/DRESSINGS) IMPLANT
CLOTH BEACON ORANGE TIMEOUT ST (SAFETY) ×3 IMPLANT
CORDS BIPOLAR (ELECTRODE) ×3 IMPLANT
COVER SURGICAL LIGHT HANDLE (MISCELLANEOUS) ×3 IMPLANT
CUFF TOURNIQUET SINGLE 18IN (TOURNIQUET CUFF) ×3 IMPLANT
CUFF TOURNIQUET SINGLE 24IN (TOURNIQUET CUFF) IMPLANT
DRAIN TLS ROUND 10FR (DRAIN) IMPLANT
DRAPE INCISE IOBAN 66X45 STRL (DRAPES) ×3 IMPLANT
DRAPE OEC MINIVIEW 54X84 (DRAPES) ×4 IMPLANT
DRAPE U-SHAPE 47X51 STRL (DRAPES) ×3 IMPLANT
DRSG PAD ABDOMINAL 8X10 ST (GAUZE/BANDAGES/DRESSINGS) ×4 IMPLANT
DRSG XEROFORM 1X8 (GAUZE/BANDAGES/DRESSINGS) ×2 IMPLANT
DURAPREP 26ML APPLICATOR (WOUND CARE) ×3 IMPLANT
ELECT REM PT RETURN 9FT ADLT (ELECTROSURGICAL) ×3
ELECTRODE REM PT RTRN 9FT ADLT (ELECTROSURGICAL) ×2 IMPLANT
FACESHIELD LNG OPTICON STERILE (SAFETY) ×3 IMPLANT
GAUZE SPONGE 4X4 12PLY STRL LF (GAUZE/BANDAGES/DRESSINGS) ×2 IMPLANT
GAUZE XEROFORM 1X8 LF (GAUZE/BANDAGES/DRESSINGS) IMPLANT
GLOVE BIO SURGEON ST LM GN SZ9 (GLOVE) ×3 IMPLANT
GLOVE BIOGEL PI IND STRL 8 (GLOVE) ×2 IMPLANT
GLOVE BIOGEL PI INDICATOR 8 (GLOVE) ×1
GLOVE SURG ORTHO 8.0 STRL STRW (GLOVE) ×3 IMPLANT
GOWN PREVENTION PLUS LG XLONG (DISPOSABLE) IMPLANT
GOWN PREVENTION PLUS XLARGE (GOWN DISPOSABLE) ×3 IMPLANT
GOWN STRL NON-REIN LRG LVL3 (GOWN DISPOSABLE) ×6 IMPLANT
KIT BASIN OR (CUSTOM PROCEDURE TRAY) ×3 IMPLANT
KIT ROOM TURNOVER OR (KITS) ×3 IMPLANT
MANIFOLD NEPTUNE II (INSTRUMENTS) ×3 IMPLANT
NEEDLE 22X1 1/2 (OR ONLY) (NEEDLE) IMPLANT
NS IRRIG 1000ML POUR BTL (IV SOLUTION) ×3 IMPLANT
PACK ORTHO EXTREMITY (CUSTOM PROCEDURE TRAY) ×3 IMPLANT
PAD ARMBOARD 7.5X6 YLW CONV (MISCELLANEOUS) ×6 IMPLANT
PAD CAST 3X4 CTTN HI CHSV (CAST SUPPLIES) ×2 IMPLANT
PAD CAST 4YDX4 CTTN HI CHSV (CAST SUPPLIES) ×2 IMPLANT
PADDING CAST COTTON 3X4 STRL (CAST SUPPLIES) ×3
PADDING CAST COTTON 4X4 STRL (CAST SUPPLIES) ×3
PADDING WEBRIL 4 STERILE (GAUZE/BANDAGES/DRESSINGS) ×2 IMPLANT
SCREW 3.5MMX14.0MM (Screw) ×4 IMPLANT
SCREW CORT 3.5X14 (Screw) ×6 IMPLANT
SCREW CORT 3.5X16 (Screw) ×8 IMPLANT
SCREW CORT 3.5X18 CO (Screw) ×2 IMPLANT
SPLINT FIBERGLASS 5X30 (CAST SUPPLIES) ×2 IMPLANT
SPONGE GAUZE 4X4 12PLY (GAUZE/BANDAGES/DRESSINGS) IMPLANT
SPONGE LAP 4X18 X RAY DECT (DISPOSABLE) ×6 IMPLANT
STAPLER VISISTAT 35W (STAPLE) IMPLANT
STRIP CLOSURE SKIN 1/2X4 (GAUZE/BANDAGES/DRESSINGS) ×3 IMPLANT
SUCTION FRAZIER TIP 10 FR DISP (SUCTIONS) ×3 IMPLANT
SUT ETHILON 3 0 PS 1 (SUTURE) ×8 IMPLANT
SUT PROLENE 3 0 PS 1 (SUTURE) IMPLANT
SUT VIC AB 2-0 CTB1 (SUTURE) ×4 IMPLANT
SUT VIC AB 3-0 X1 27 (SUTURE) ×2 IMPLANT
SYR CONTROL 10ML LL (SYRINGE) IMPLANT
SYSTEM CHEST DRAIN TLS 7FR (DRAIN) IMPLANT
TOWEL OR 17X24 6PK STRL BLUE (TOWEL DISPOSABLE) ×3 IMPLANT
TOWEL OR 17X26 10 PK STRL BLUE (TOWEL DISPOSABLE) ×3 IMPLANT
TUBE CONNECTING 12X1/4 (SUCTIONS) ×3 IMPLANT
WATER STERILE IRR 1000ML POUR (IV SOLUTION) ×3 IMPLANT
YANKAUER SUCT BULB TIP NO VENT (SUCTIONS) IMPLANT

## 2011-12-14 NOTE — Transfer of Care (Signed)
Immediate Anesthesia Transfer of Care Note  Patient: Cynthia Welch  Procedure(s) Performed: Procedure(s) (LRB): OPEN REDUCTION INTERNAL FIXATION (ORIF) ULNAR FRACTURE (Right)  Patient Location: PACU  Anesthesia Type: General  Level of Consciousness: awake  Airway & Oxygen Therapy: Patient Spontanous Breathing and Patient connected to nasal cannula oxygen  Post-op Assessment: Report given to PACU RN and Post -op Vital signs reviewed and stable  Post vital signs: stable  Complications: No apparent anesthesia complications

## 2011-12-14 NOTE — Brief Op Note (Signed)
12/14/2011  1:04 PM  PATIENT:  Cynthia Welch  51 y.o. female  PRE-OPERATIVE DIAGNOSIS:  Right ulnar shaft hypertrophic non-union  POST-OPERATIVE DIAGNOSIS:  Right ulnar shaft hypertrophic non-union  PROCEDURE:  Procedure(s): OPEN REDUCTION INTERNAL FIXATION (ORIF) ULNAR FRACTURE  SURGEON:  Surgeon(s): Cammy Copa, MD  ASSISTANT:none*  ANESTHESIA:   general  EBL: 10 ml    Total I/O In: 1000 [I.V.:1000] Out: -   BLOOD ADMINISTERED: none  DRAINS: none   LOCAL MEDICATIONS USED:  none  SPECIMEN:  No Specimen  COUNTS:  YES  TOURNIQUET:  86 min at 250 mm hg  DICTATION: .Other Dictation: Dictation Number [id number]  PLAN OF CARE: Admit to inpatient   PATIENT DISPOSITION:  PACU - hemodynamically stable

## 2011-12-14 NOTE — Progress Notes (Signed)
Cell Phone and dentures put in locker w/belongings. PACU to receive since pt is to be admitted.//L. Adilynn Bessey,RN

## 2011-12-14 NOTE — Preoperative (Signed)
Beta Blockers   Reason not to administer Beta Blockers:Not Applicable 

## 2011-12-14 NOTE — Progress Notes (Signed)
Orthopedic Tech Progress Note Patient Details:  Cynthia Welch December 10, 1960 [id number]  Other Ortho Devices Type of Ortho Device: Other (comment) (sling arm elev kuzman) Ortho Device Location: (R) UE Ortho Device Interventions: Application   Jennye Moccasin 12/14/2011, 5:54 PM

## 2011-12-14 NOTE — Anesthesia Preprocedure Evaluation (Addendum)
Anesthesia Evaluation  Patient identified by MRN, date of birth, ID band Patient awake    Reviewed: Allergy & Precautions, H&P , NPO status , Patient's Chart, lab work & pertinent test results  History of Anesthesia Complications (+) PONV  Airway Mallampati: I TM Distance: >3 FB Neck ROM: Full    Dental  (+) Poor Dentition   Pulmonary Current Smoker (1/2 ppd x 15 years),  + rhonchi        Cardiovascular hypertension,     Neuro/Psych Seizures - (2-3; last one >1 yr ago; ? stress related),     GI/Hepatic   Endo/Other    Renal/GU      Musculoskeletal   Abdominal Normal abdominal exam  (+)   Peds  Hematology   Anesthesia Other Findings   Reproductive/Obstetrics                          Anesthesia Physical Anesthesia Plan  ASA: III  Anesthesia Plan: General   Post-op Pain Management:    Induction: Intravenous  Airway Management Planned: Oral ETT  Additional Equipment:   Intra-op Plan:   Post-operative Plan:   Informed Consent: I have reviewed the patients History and Physical, chart, labs and discussed the procedure including the risks, benefits and alternatives for the proposed anesthesia with the patient or authorized representative who has indicated his/her understanding and acceptance.     Plan Discussed with: CRNA and Surgeon  Anesthesia Plan Comments:         Anesthesia Quick Evaluation

## 2011-12-14 NOTE — Anesthesia Procedure Notes (Signed)
Date/Time: 12/14/2011 10:47 AM Performed by: Romie Minus Pre-anesthesia Checklist: Patient identified, Emergency Drugs available, Suction available and Patient being monitored Patient Re-evaluated:Patient Re-evaluated prior to inductionPreoxygenation: Pre-oxygenation with 100% oxygen Intubation Type: IV induction Ventilation: Mask ventilation without difficulty and Oral airway inserted - appropriate to patient size Laryngoscope Size: Miller and 2 Grade View: Grade I Tube type: Oral Tube size: 7.5 mm Number of attempts: 1 Placement Confirmation: ETT inserted through vocal cords under direct vision,  positive ETCO2 and breath sounds checked- equal and bilateral Secured at: 22 cm Tube secured with: Tape Dental Injury: Teeth and Oropharynx as per pre-operative assessment

## 2011-12-14 NOTE — H&P (Signed)
Cynthia Welch is an 51 y.o. female.   Chief Complaint: Right arm pain HPI: Cynthia Welch is a 51 year old female with right arm pain. She is 6 weeks status post right ulnar shaft open reduction internal fixation. Patient did well for several weeks but then had increasing pain in her right arm. Radiographs demonstrate progressive loosening and hypertrophic nonunion of the fracture site. The patient has continued to smoke during this treatment. Despite recommendations against. She now presents for redo open reduction internal fixation with iliac crest bone grafting.  Past Medical History  Diagnosis Date  . PONV (postoperative nausea and vomiting)   . Seizures     ABOU T  1  YR  AGO.......NO PROBLEMS SINCE  . Fibroids     Past Surgical History  Procedure Date  . Orif ulnar fracture 09/03/2011    Procedure: OPEN REDUCTION INTERNAL FIXATION (ORIF) ULNAR FRACTURE;  Surgeon: Cammy Copa;  Location: [facility] OR;  Service: Orthopedics;  Laterality: Right;    History reviewed. No pertinent family history. Social History:  reports that she has been smoking Cigarettes.  She has a 3.75 pack-year smoking history. She does not have any smokeless tobacco history on file. She reports that she drinks about 3.6 ounces of alcohol per week. She reports that she does not use illicit drugs.  Allergies:  Allergies  Allergen Reactions  . Oxycodone Hcl Itching  . Acetaminophen     REACTION: itching    Medications Prior to Admission  Medication Dose Route Frequency Provider Last Rate Last Dose  . ceFAZolin (ANCEF) IVPB 2 g/50 mL premix  2 g Intravenous 60 min Pre-Op Cammy Copa, MD      . chlorhexidine (HIBICLENS) 4 % liquid 4 application  60 mL Topical Once Cammy Copa, MD      . fentaNYL (SUBLIMAZE) injection 50-100 mcg  50-100 mcg Intravenous PRN Bedelia Person, MD      . lactated ringers infusion   Intravenous Continuous Bedelia Person, MD 50 mL/hr at 12/14/11 0931    . midazolam (VERSED)  injection 1-2 mg  1-2 mg Intravenous PRN Bedelia Person, MD       Medications Prior to Admission  Medication Sig Dispense Refill  . oxyCODONE (OXY IR/ROXICODONE) 5 MG immediate release tablet Take 5 mg by mouth 2 (two) times daily as needed. For pain        Results for orders placed during the hospital encounter of 12/14/11 (from the past 48 hour(s))  HCG, SERUM, QUALITATIVE     Status: Normal   Collection Time   12/14/11  8:39 AM      Component Value Range Comment   Preg, Serum NEGATIVE  NEGATIVE     No results found.  Review of Systems  Constitutional: Negative.   HENT: Negative.   Eyes: Negative.   Respiratory: Negative.   Cardiovascular: Negative.   Gastrointestinal: Negative.   Genitourinary: Negative.   Musculoskeletal: Positive for joint pain.  Skin: Negative.   Neurological: Negative.   Endo/Heme/Allergies: Negative.   Psychiatric/Behavioral: Negative.     Blood pressure 152/94, pulse 73, temperature 98.3 F (36.8 C), temperature source Oral, resp. rate 18, last menstrual period 11/20/2011, SpO2 96.00%. Physical Exam  Constitutional: She is oriented to person, place, and time. She appears well-developed and well-nourished.  HENT:  Head: Normocephalic.  Eyes: Pupils are equal, round, and reactive to light.  Neck: Normal range of motion.  Cardiovascular: Normal rate and regular rhythm.   Respiratory: Effort normal.  GI: Soft.  Neurological: She is alert and oriented to person, place, and time.   examination of the right arm demonstrates well-healed surgical incision. Motor sensory function to the hand is intact. She has tenderness and hypertrophic tissue at the fracture site. Elbow range of motion is full. Wrist range of motion is full.  Assessment/Plan Impression is hypertrophic nonunion right ulnar shaft fracture. There has been pulling away of the bone from the screw fixation with the plate. Plan at this time is for redo fixation longer plate iliac crest bone  grafting. Risks and benefits are discussed with the patient including but not limited to infection nerve or vessel damage incomplete healing. She is counseled as to the need to stop smoking in order to improve her chances to heal. All questions are answered. We will use iliac crest bone grafting in the risk associated with that procedure discussed including but not limited to pain and a rare chance of leg numbness. Indicating that on Cynthia Welch  [facility] SCOTT 12/14/2011, 10:36 AM

## 2011-12-14 NOTE — Progress Notes (Signed)
Orthopedic Tech Progress Note Patient Details:  Cynthia Welch 06/22/1961 [id number]  Patient ID: [name], female   DOB: 1961-02-02, 51 y.o.   MRN: [id number]   Jennye Moccasin 12/14/2011, 5:55 PM Viewed order on rn list.

## 2011-12-14 NOTE — Anesthesia Postprocedure Evaluation (Signed)
  Anesthesia Post-op Note  Patient: Cynthia Welch  Procedure(s) Performed: Procedure(s) (LRB): OPEN REDUCTION INTERNAL FIXATION (ORIF) ULNAR FRACTURE (Right)  Patient Location: PACU  Anesthesia Type: General  Level of Consciousness: awake, alert  and oriented  Airway and Oxygen Therapy: Patient Spontanous Breathing and Patient connected to nasal cannula oxygen  Post-op Pain: mild  Post-op Assessment: Post-op Vital signs reviewed, Patient's Cardiovascular Status Stable, Respiratory Function Stable, Patent Airway, No signs of Nausea or vomiting and Pain level controlled  Post-op Vital Signs: Reviewed and stable  Complications: No apparent anesthesia complications

## 2011-12-15 MED ORDER — HYDROMORPHONE HCL 2 MG PO TABS: 2.0000 mg | ORAL_TABLET | ORAL | Status: AC | PRN

## 2011-12-15 MED ORDER — METHOCARBAMOL 500 MG PO TABS: 500.0000 mg | ORAL_TABLET | Freq: Three times a day (TID) | ORAL | Status: AC

## 2011-12-15 NOTE — Op Note (Signed)
NAMEMARGARETANN, ABATE           ACCOUNT NO.:  [id number]  MEDICAL RECORD NO.:  [id number]  LOCATION:  5035                         FACILITY:  [facility]  PHYSICIAN:  Burnard Bunting, M.D.    DATE OF BIRTH:  1961-04-06  DATE OF PROCEDURE:  12/14/2011 DATE OF DISCHARGE:                              OPERATIVE REPORT   PREOPERATIVE DIAGNOSIS:  Right ulnar shaft hypertrophic nonunion.  POSTOPERATIVE DIAGNOSIS:  Right ulnar shaft hypertrophic nonunion.  PROCEDURE:  Right ulnar shaft hypertrophic nonunion takedown with re- plating and local bone grafting.  SURGEON:  Burnard Bunting, MD  ASSISTANT:  None.  ANESTHESIA:  General, endotracheal.  ESTIMATED BLOOD LOSS:  10 mL.  DRAINS:  None.  INDICATIONS:  Cynthia Welch is a patient who is now about 6-8 weeks out from right ulnar shaft fracture open reduction and internal fixation.  She has a hypertrophic nonunion with the loosening of the hardware of the distal fragment.  She presents now for repeat fixation after explanation of risks and benefits.  PROCEDURE IN DETAIL:  The patient was brought to the operating room where general endotracheal anesthesia was induced.  Preop antibiotics were administered.  Time-out was called.  Right hip and right arm were prepped and pre-scrubbed with alcohol and Betadine, which allowed to air dry, prepped with DuraPrep solution, draped in sterile manner.  Time-out was called.  Both of these areas were covered with Ioban.  Right arm was elevated and exsanguinated with Esmarch wrap.  Tourniquet was inflated. Previous incision was used.  Skin and subcutaneous tissue were sharply divided.  Care was taken to avoid injury to the crossing branch of the superficial ulnar nerve.  The crossing vein was cauterized.  At this time, the plate was removed.  The hypertrophic nonunion site was identified and soft tissue was removed from this hypertrophic nonunion site.  Minimal motion was present, but there was some motion  present at the fracture site.  Following debridement and opening up of canal, the incision was thoroughly irrigated.  Re-plating was then performed on the direct lateral aspect of the ulna compared to where the plate was dorsally.  The Acumed plate was used with 4 holes distal fixation, 4 holes proximal fixation.  In the distal fixation, the plate was 1st applied in compression noted and then 2 locking screws were added to the 2 standard screws in the distal fragment, 4 nonlocking screws were placed in the proximal fragment.  Stable fixation was achieved.  The local bone grafting was then performed filling the defect in the intramedullary canal.  At this time, the tourniquet was released.  Bleeding points encountered were cauterized with electrocautery.  Incision was closed using interrupted inverted 2-0 Vicryl and 3-0 nylon sutures.  Care was taken to avoid injury to the ulnar nerve and artery.  Bulky dressing was applied.  The patient tolerated the procedure well without immediate complication.     Burnard Bunting, M.D.     GSD/MEDQ  D:  12/14/2011  T:  12/15/2011  Job:  [id number]

## 2011-12-15 NOTE — Progress Notes (Signed)
Pt stable - vss - r hand mobile perfused sensate - plan dc today

## 2011-12-15 NOTE — Op Note (Signed)
NAMELARYN, VENNING NO.:  [id number]  MEDICAL RECORD NO.:  [id number]  LOCATION:  5035                         FACILITY:  [facility]  PHYSICIAN:  Burnard Bunting, M.D.    DATE OF BIRTH:  [birth year]  DATE OF PROCEDURE:  12/14/2011 DATE OF DISCHARGE:                              OPERATIVE REPORT   ADDENDUM:  Fluoroscopy was utilized to ensure correct placement and length of the screws in both the AP and lateral planes during the procedure.  This is a necessary requirement to ensure optimal fixation.     Burnard Bunting, M.D.     GSD/MEDQ  D:  12/14/2011  T:  12/15/2011  Job:  [id number]

## 2011-12-21 ENCOUNTER — Encounter (HOSPITAL_COMMUNITY): Payer: Self-pay | Admitting: Orthopedic Surgery

## 2012-01-03 NOTE — Discharge Summary (Signed)
Physician Discharge Summary  [name] MRN: [id number] DOB/AGE: [birth year] 51 y.o.  Admit date: 12/14/2011 Discharge date: 12/16/2011 Admission Diagnoses:  Right ulnar shaft fracture  Discharge Diagnoses:  Same  Surgeries: Procedure(s): OPEN REDUCTION INTERNAL FIXATION (ORIF) ULNAR FRACTURE on 12/14/2011   Consultants:    Discharged Condition: Stable  Hospital Course: Cynthia Welch is an 51 y.o. female who was admitted 12/14/2011 with a chief complaint of right arm pain, and found to have a diagnosis of right ulnar shaft fracture.  They were brought to the operating room on 12/14/2011 and underwent the above named procedures.    Antibiotics given:  Anti-infectives     Start     Dose/Rate Route Frequency Ordered Stop   12/14/11 1700   ceFAZolin (ANCEF) IVPB 1 g/50 mL premix        1 g 100 mL/hr over 30 Minutes Intravenous Every 8 hours 12/14/11 1549 12/15/11 0954   12/13/11 1430   ceFAZolin (ANCEF) IVPB 2 g/50 mL premix        2 g 100 mL/hr over 30 Minutes Intravenous 60 min pre-op 12/13/11 1430 12/14/11 1039        .  Recent vital signs:  Filed Vitals:   12/15/11 0552  BP: 130/81  Pulse: 82  Temp: 99.1 F (37.3 C)  Resp: 18    Recent laboratory studies:  Results for orders placed during the hospital encounter of 12/14/11  HCG, SERUM, QUALITATIVE      Component Value Range   Preg, Serum NEGATIVE  NEGATIVE     Discharge Medications:   Medication List  As of 01/03/2012  8:11 AM   STOP taking these medications         oxyCODONE 5 MG immediate release tablet            Diagnostic Studies:  Wrist 2 Views Right  12/14/2011  *RADIOLOGY REPORT*  Clinical Data: The ulna fracture  RIGHT WRIST - 2 VIEW  Comparison: 09/03/2011  Findings: The initial plate and screws within the callus formation across the fracture site is present.  There is anatomic alignment. No breakage or loosening of the hardware.  The fracture line is still visible.  No new  fracture.  IMPRESSION: Hardware transfixing the ulna fracture has been revised.  There is evidence of callus formation and healing at the fracture site.  Original Report Authenticated By: Donavan Burnet, M.D.    Disposition: 01-Home or Self Care  Discharge Orders    Future Orders Please Complete By Expires   Diet - low sodium heart healthy      Call MD / Call 911      Comments:   If you experience chest pain or shortness of breath, CALL 911 and be transported to the hospital emergency room.  If you develope a fever above 101 F, pus (white drainage) or increased drainage or redness at the wound, or calf pain, call your surgeon's office.   Constipation Prevention      Comments:   Drink plenty of fluids.  Prune juice may be helpful.  You may use a stool softener, such as Colace (over the counter) 100 mg twice a day.  Use MiraLax (over the counter) for constipation as needed.   Increase activity slowly as tolerated      Weight Bearing as taught in Physical Therapy      Comments:   Use a walker or crutches as instructed.   Discharge instructions      Comments:   !  [place] hand 2. Move fingers 3. Keep splint dry and keep splint on         Signed: Andriana Casa SCOTT 01/03/2012, 8:11 AM

## 2013-01-27 ENCOUNTER — Encounter (HOSPITAL_COMMUNITY): Payer: Self-pay | Admitting: Emergency Medicine

## 2013-01-27 ENCOUNTER — Emergency Department (HOSPITAL_COMMUNITY): Payer: Self-pay

## 2013-01-27 ENCOUNTER — Inpatient Hospital Stay (HOSPITAL_COMMUNITY)
Admission: EM | Admit: 2013-01-27 | Discharge: 2013-01-30 | DRG: 100 | Disposition: A | Discharge status: Home Health | Payer: MEDICAID | Attending: Internal Medicine | Admitting: Internal Medicine

## 2013-01-27 DIAGNOSIS — F10239 Alcohol dependence with withdrawal, unspecified: Secondary | ICD-10-CM

## 2013-01-27 DIAGNOSIS — S065XAA Traumatic subdural hemorrhage with loss of consciousness status unknown, initial encounter: Secondary | ICD-10-CM | POA: Diagnosis present

## 2013-01-27 DIAGNOSIS — E7800 Pure hypercholesterolemia, unspecified: Secondary | ICD-10-CM | POA: Diagnosis present

## 2013-01-27 DIAGNOSIS — E876 Hypokalemia: Secondary | ICD-10-CM | POA: Diagnosis not present

## 2013-01-27 DIAGNOSIS — F17200 Nicotine dependence, unspecified, uncomplicated: Secondary | ICD-10-CM | POA: Diagnosis present

## 2013-01-27 DIAGNOSIS — E785 Hyperlipidemia, unspecified: Secondary | ICD-10-CM | POA: Diagnosis present

## 2013-01-27 DIAGNOSIS — I10 Essential (primary) hypertension: Secondary | ICD-10-CM | POA: Diagnosis present

## 2013-01-27 DIAGNOSIS — I6200 Nontraumatic subdural hemorrhage, unspecified: Secondary | ICD-10-CM | POA: Diagnosis present

## 2013-01-27 DIAGNOSIS — R569 Unspecified convulsions: Secondary | ICD-10-CM

## 2013-01-27 DIAGNOSIS — S065X9A Traumatic subdural hemorrhage with loss of consciousness of unspecified duration, initial encounter: Secondary | ICD-10-CM

## 2013-01-27 DIAGNOSIS — F1010 Alcohol abuse, uncomplicated: Secondary | ICD-10-CM | POA: Diagnosis present

## 2013-01-27 DIAGNOSIS — G40909 Epilepsy, unspecified, not intractable, without status epilepticus: Secondary | ICD-10-CM | POA: Diagnosis present

## 2013-01-27 HISTORY — DX: Traumatic subdural hemorrhage with loss of consciousness of unspecified duration, initial encounter: S06.5X9A

## 2013-01-27 LAB — COMPREHENSIVE METABOLIC PANEL
ALT: 27 U/L (ref 0–35)
AST: 75 U/L — ABNORMAL HIGH (ref 0–37)
Albumin: 3.4 g/dL — ABNORMAL LOW (ref 3.5–5.2)
CO2: 23 mEq/L (ref 19–32)
Calcium: 9 mg/dL (ref 8.4–10.5)
Creatinine, Ser: 0.57 mg/dL (ref 0.50–1.10)
Sodium: 135 mEq/L (ref 135–145)
Total Protein: 7.5 g/dL (ref 6.0–8.3)

## 2013-01-27 LAB — CBC
MCH: 35.6 pg — ABNORMAL HIGH (ref 26.0–34.0)
MCHC: 35 g/dL (ref 30.0–36.0)
Platelets: 132 10*3/uL — ABNORMAL LOW (ref 150–400)
RBC: 3.68 MIL/uL — ABNORMAL LOW (ref 3.87–5.11)
RDW: 13.1 % (ref 11.5–15.5)

## 2013-01-27 LAB — GLUCOSE, CAPILLARY: Glucose-Capillary: 130 mg/dL — ABNORMAL HIGH (ref 70–99)

## 2013-01-27 LAB — RAPID URINE DRUG SCREEN, HOSP PERFORMED
Amphetamines: NOT DETECTED
Barbiturates: NOT DETECTED
Benzodiazepines: NOT DETECTED
Cocaine: NOT DETECTED
Tetrahydrocannabinol: NOT DETECTED

## 2013-01-27 MED ORDER — ONDANSETRON HCL 4 MG/2ML IJ SOLN: 4.0000 mg | Freq: Four times a day (QID) | INTRAMUSCULAR | Status: DC | PRN

## 2013-01-27 MED ORDER — ADULT MULTIVITAMIN W/MINERALS CH: 1.0000 | ORAL_TABLET | Freq: Every day | ORAL | Status: DC

## 2013-01-27 MED ORDER — LORAZEPAM 2 MG/ML IJ SOLN: 1.0000 mg | Freq: Four times a day (QID) | INTRAMUSCULAR | Status: DC | PRN

## 2013-01-27 MED ORDER — LORAZEPAM 2 MG/ML IJ SOLN
1.0000 mg | Freq: Once | INTRAMUSCULAR | Status: AC
  Administered 2013-01-27: 1 mg via INTRAVENOUS
  Filled 2013-01-27: qty 1

## 2013-01-27 MED ORDER — LORAZEPAM 1 MG PO TABS: 1.0000 mg | ORAL_TABLET | Freq: Four times a day (QID) | ORAL | Status: DC | PRN

## 2013-01-27 MED ORDER — VITAMIN B-1 100 MG PO TABS
100.0000 mg | ORAL_TABLET | Freq: Every day | ORAL | Status: DC
  Administered 2013-01-28 – 2013-01-30 (×3): 100 mg via ORAL
  Filled 2013-01-27 (×3): qty 1

## 2013-01-27 MED ORDER — VITAMIN B-1 100 MG PO TABS: 100.0000 mg | ORAL_TABLET | Freq: Every day | ORAL | Status: DC

## 2013-01-27 MED ORDER — ADULT MULTIVITAMIN W/MINERALS CH
1.0000 | ORAL_TABLET | Freq: Every day | ORAL | Status: DC
  Administered 2013-01-28: 1 via ORAL
  Filled 2013-01-27: qty 1

## 2013-01-27 MED ORDER — SODIUM CHLORIDE 0.9 % IV SOLN
INTRAVENOUS | Status: DC
  Administered 2013-01-28 (×2): via INTRAVENOUS

## 2013-01-27 MED ORDER — SODIUM CHLORIDE 0.9 % IJ SOLN
3.0000 mL | Freq: Two times a day (BID) | INTRAMUSCULAR | Status: DC
  Administered 2013-01-28 – 2013-01-29 (×3): 3 mL via INTRAVENOUS

## 2013-01-27 MED ORDER — FOLIC ACID 1 MG PO TABS
1.0000 mg | ORAL_TABLET | Freq: Every day | ORAL | Status: DC
  Administered 2013-01-28 – 2013-01-30 (×3): 1 mg via ORAL
  Filled 2013-01-27 (×3): qty 1

## 2013-01-27 MED ORDER — LORAZEPAM 1 MG PO TABS: 0.0000 mg | ORAL_TABLET | Freq: Two times a day (BID) | ORAL | Status: DC

## 2013-01-27 MED ORDER — THIAMINE HCL 100 MG/ML IJ SOLN: 100.0000 mg | Freq: Every day | INTRAMUSCULAR | Status: DC

## 2013-01-27 MED ORDER — LORAZEPAM 1 MG PO TABS: 0.0000 mg | ORAL_TABLET | Freq: Four times a day (QID) | ORAL | Status: DC

## 2013-01-27 MED ORDER — ONDANSETRON HCL 4 MG PO TABS: 4.0000 mg | ORAL_TABLET | Freq: Four times a day (QID) | ORAL | Status: DC | PRN

## 2013-01-27 MED ORDER — ENOXAPARIN SODIUM 40 MG/0.4ML ~~LOC~~ SOLN
40.0000 mg | SUBCUTANEOUS | Status: DC
  Administered 2013-01-28 – 2013-01-29 (×2): 40 mg via SUBCUTANEOUS
  Filled 2013-01-27 (×3): qty 0.4

## 2013-01-27 MED ORDER — FOLIC ACID 1 MG PO TABS: 1.0000 mg | ORAL_TABLET | Freq: Every day | ORAL | Status: DC

## 2013-01-27 MED ORDER — LORAZEPAM 2 MG/ML IJ SOLN
2.0000 mg | Freq: Once | INTRAMUSCULAR | Status: AC
  Administered 2013-01-27: 2 mg via INTRAVENOUS
  Filled 2013-01-27: qty 1

## 2013-01-27 MED ORDER — LORAZEPAM 1 MG PO TABS
0.0000 mg | ORAL_TABLET | Freq: Four times a day (QID) | ORAL | Status: DC
  Administered 2013-01-27: 2 mg via ORAL
  Filled 2013-01-27: qty 2

## 2013-01-27 MED ORDER — SODIUM CHLORIDE 0.9 % IV SOLN: INTRAVENOUS | Status: DC

## 2013-01-27 MED ORDER — THIAMINE HCL 100 MG/ML IJ SOLN
100.0000 mg | Freq: Every day | INTRAMUSCULAR | Status: DC
  Filled 2013-01-27 (×3): qty 1

## 2013-01-27 NOTE — H&P (Signed)
History and Physical  Cynthia Welch ZOX:[id number] DOB: 1961/08/13 DOA: 01/27/2013  Referring physician: Dr Patria Mane (ER) PCP: No PCP Per Patient   Chief Complaint: Siezure  HPI: 52 y.o. Female with past medical history most significant for seizure disorder, chronic alcohol use, hypertension and hyperlipidemia presented to [facility] hospital s/p two seizures today. Pt is a poor historian as she is unsure of course of events. Husband states he was present when both seizures took place and that they took place today. Husband was home but did not witness the first seizure which he says took place around noon. (Pt says it was yesterday). Husband says he heard pt fall and saw her on the dining room floor. Husband witnessed second seizure around 6pm tonight in their bedroom where he saw patient stiffen and start to fall. He caught her.  Admits LOC, hitting her head. Other injuries include splitting her lip and breaking a tooth. Admits myalgias. "Everything hurts."  Pt was dx about a year ago with seizures, but has not pursued any follow up and is not on any medication for them.   Hospitalists team was asked to admit the patient.  Review of Systems:  15 point ROS is negative. Except what is noted above.  Past Medical History  Diagnosis Date  . PONV (postoperative nausea and vomiting)   . Seizures     ABOU T  1  YR  AGO.......NO PROBLEMS SINCE  . Fibroids     Past Surgical History  Procedure Laterality Date  . Orif ulnar fracture  09/03/2011    Procedure: OPEN REDUCTION INTERNAL FIXATION (ORIF) ULNAR FRACTURE;  Surgeon: Cammy Copa;  Location: [facility] OR;  Service: Orthopedics;  Laterality: Right;  . Orif ulnar fracture  12/14/2011    Procedure: OPEN REDUCTION INTERNAL FIXATION (ORIF) ULNAR FRACTURE;  Surgeon: Cammy Copa, MD;  Location: [facility] OR;  Service: Orthopedics;  Laterality: Right;  Right ulnar shaft open reduction, internal fixation with right iliac crest bone graft    Social  History:  reports that she has been smoking Cigarettes.  She has a 3.75 pack-year smoking history. She does not have any smokeless tobacco history on file. She reports that she drinks about 3.6 ounces of alcohol per week. She reports that she does not use illicit drugs.  Allergies  Allergen Reactions  . Oxycodone Hcl Itching  . Acetaminophen     REACTION: itching    No family history on file.   Prior to Admission medications   Not on File   Physical Exam: Filed Vitals:   01/27/13 1910 01/27/13 1911 01/27/13 2158 01/27/13 2207  BP: 119/99 154/100 132/98 144/86  Pulse: 82 77  81  Temp:  98.7 F (37.1 C) 98.9 F (37.2 C)   TempSrc:  Oral Oral   Resp:  18 18   SpO2: 100% 100% 100%    Physical Exam: General: Vital signs reviewed and noted. Well-developed, well-nourished, in no acute distress; alert, appropriate and cooperative throughout examination. Patient's lower lip seems swollen from the fall. Her tongue is bitten and bleeding  Head: Normocephalic, atraumatic.  Eyes: PERRL, EOMI, No signs of anemia or jaundince.  Nose: Mucous membranes moist, not inflammed, nonerythematous.  Throat: Oropharynx nonerythematous, no exudate appreciated.   Neck: No deformities, masses, or tenderness noted.Supple, No carotid Bruits, no JVD.  Lungs:  Normal respiratory effort. Clear to auscultation BL without crackles or wheezes.  Heart: RRR. S1 and S2 normal without gallop, murmur, or rubs.  Abdomen:  BS normoactive. Soft,  Nondistended, non-tender.  No masses or organomegaly.  Extremities: No pretibial edema.  Neurologic: A&O X3, CN II - XII are grossly intact. Motor strength is 5/5 in the all 4 extremities, Sensations intact to light touch, Cerebellar signs negative.  Skin: No visible rashes, scars.    Wt Readings from Last 3 Encounters:  12/14/11 171 lb (77.565 kg)  12/14/11 171 lb (77.565 kg)  12/11/11 171 lb 4.8 oz (77.7 kg)    Labs on Admission:  Basic Metabolic Panel:  Recent  Labs Lab 01/27/13 2115  NA 135  K 3.8  CL 99  CO2 23  GLUCOSE 96  BUN 5*  CREATININE 0.57  CALCIUM 9.0    Liver Function Tests:  Recent Labs Lab 01/27/13 2115  AST 75*  ALT 27  ALKPHOS 77  BILITOT 0.5  PROT 7.5  ALBUMIN 3.4*   CBC:  Recent Labs Lab 01/27/13 2115  WBC 5.4  HGB 13.1  HCT 37.4  MCV 101.6*  PLT 132*    CBG:  Recent Labs Lab 01/27/13 1921  GLUCAP 130*     Radiological Exams on Admission: Ct Head Wo Contrast  01/27/2013  *RADIOLOGY REPORT*  Clinical Data: Seizures.  CT HEAD WITHOUT CONTRAST  Technique:  Contiguous axial images were obtained from the base of the skull through the vertex without contrast.  Comparison: CT head without contrast 01/13/2011.  Findings: No acute cortical infarct, hemorrhage, mass lesion is present.  The ventricles are of normal size.  No significant extra- axial fluid collection is present.  The paranasal sinuses and mastoid air cells are clear.  The osseous skull is intact.  No significant extracranial soft tissue injury is evident.  IMPRESSION:  1.  Negative CT of head.   Original Report Authenticated By: Marin Roberts, M.D.     Principal Problem:   SEIZURE DISORDER Active Problems:   HYPERCHOLESTEROLEMIA   HYPERTENSION, BENIGN ESSENTIAL   Assessment/Plan  Patient is a 52 year old female with past medical history most significant for chronic alcohol use, seizure disorder and hypertension who comes in today with a seizure. Patient has had history of seizure disorder in the past but has not followed up with neurology. There has been a concern that this is most likely related to alcohol withdrawal although patient had her last drink yesterday afternoon and given that it has been only 24 hours since her last drink, this is less likely to be from alcohol withdrawal.  -Admit to telemetry -CIWA protocol -Ativan as per protocol as needed -Neurology consult in the morning -CT scan negative for any masses -patient  does not have PCP and will need one -N.p.o. for now -Continue IV fluids NS at 100 cc an hour -continue folate, thiamine   Code Status: full Family Communication: none present at bedside Disposition Plan/Anticipated LOS: guarded  Time spent: 70 minutes  Lars Mage, MD  [facility] Team 5  If 7PM-7AM, please contact night-coverage at [email or web address], password [facility] 01/27/2013, 10:55 PM

## 2013-01-27 NOTE — ED Provider Notes (Signed)
History     CSN: [id number]  Arrival date & time 01/27/13  1901   First MD Initiated Contact with Patient 01/27/13 1949      Chief Complaint  Patient presents with  . Seizures    (Consider location/radiation/quality/duration/timing/severity/associated sxs/prior treatment) HPI Comments: 52 y.o. Female presents s/p two seizures today. Pt is a poor historian as she is unsure of course of events. Husband states he was present when both seizures took place and that they took place today. Husband was home but did not witness the first seizure which he says took place around noon. (Pt says it was yesterday). Husband says he heard pt fall and saw her tonic and tremulous on the dining room floor. States it lasted about 20-30 minutes and pt was postictal about 15 minutes. Husband witnessed second seizure around 6pm tonight in their bedroom where he saw patient stiffen and start to fall. He caught her. Presented similarly to the first, tonic and tremulous for about 20-30 minutes and postictal for about 15 minutes.   Admits LOC, hitting her head, several episodes of vomiting afterward. Other injuries include splitting her lip and breaking a tooth. Admits myalgias. "Everything hurts." Pt also drinks 120 oz of beer daily and stopped 3 days ago because she was just "sick of it."   Pt was dx about a year ago with seizures, has had a few seizures since then (never two in one day) but has not pursued any follow up and is not on any medication for them.  Patient is a 52 y.o. female presenting with seizures.  Seizures   Past Medical History  Diagnosis Date  . PONV (postoperative nausea and vomiting)   . Seizures     ABOU T  1  YR  AGO.......NO PROBLEMS SINCE  . Fibroids     Past Surgical History  Procedure Laterality Date  . Orif ulnar fracture  09/03/2011    Procedure: OPEN REDUCTION INTERNAL FIXATION (ORIF) ULNAR FRACTURE;  Surgeon: Cammy Copa;  Location: [facility] OR;  Service: Orthopedics;   Laterality: Right;  . Orif ulnar fracture  12/14/2011    Procedure: OPEN REDUCTION INTERNAL FIXATION (ORIF) ULNAR FRACTURE;  Surgeon: Cammy Copa, MD;  Location: [facility] OR;  Service: Orthopedics;  Laterality: Right;  Right ulnar shaft open reduction, internal fixation with right iliac crest bone graft    No family history on file.  History  Substance Use Topics  . Smoking status: Current Every Day Smoker -- 0.25 packs/day for 15 years    Types: Cigarettes  . Smokeless tobacco: Not on file  . Alcohol Use: 3.6 oz/week    6 Cans of beer per week    OB History   Grav Para Term Preterm Abortions TAB SAB Ect Mult Living                  Review of Systems  Constitutional: Negative for fever and diaphoresis.  HENT: Positive for dental problem. Negative for trouble swallowing, neck pain and neck stiffness.        Chipped a tooth during her fall  Eyes: Negative for visual disturbance.  Respiratory: Negative for apnea, chest tightness and shortness of breath.   Cardiovascular: Negative for chest pain and palpitations.  Gastrointestinal: Positive for vomiting. Negative for nausea, abdominal pain, diarrhea and constipation.  Musculoskeletal: Positive for myalgias. Negative for back pain and gait problem.       "body hurts all over"  Skin: Negative for rash.  Neurological: Positive for tremors  and seizures. Negative for dizziness, speech difficulty, weakness, light-headedness, numbness and headaches.    Allergies  Oxycodone hcl and Acetaminophen  Home Medications  No current outpatient prescriptions on file.  BP 154/100  Pulse 77  Temp(Src) 98.7 F (37.1 C) (Oral)  Resp 18  SpO2 100%  Physical Exam  Nursing note and vitals reviewed. Constitutional: She is oriented to person, place, and time. She appears well-developed and well-nourished. No distress.  HENT:  Head: Normocephalic and atraumatic.  Mouth/Throat:    Pt wears lower bridge and bottom lip was caught in the  [place] of the apparatus. Upper and lower lip bruised and swollen  Eyes: Conjunctivae and EOM are normal.  Neck: Normal range of motion. Neck supple.  No meningeal signs  Cardiovascular: Normal rate, regular rhythm, normal heart sounds and intact distal pulses.  Exam reveals no gallop and no friction rub.   No murmur heard. Pulmonary/Chest: Effort normal and breath sounds normal. No respiratory distress. She has no wheezes. She has no rales. She exhibits no tenderness.  Abdominal: Soft. Bowel sounds are normal. She exhibits no distension. There is no tenderness. There is no rebound and no guarding.  Musculoskeletal: Normal range of motion. She exhibits no edema and no tenderness.  No step-offs noted on C-spine Full range of motion of the T-spine and L-spine No tenderness to palpation of the spinous processes of the C-spine, T-spine or L-spine Normal strength in upper and lower extremities bilaterally including dorsiflexion and plantar flexion, strong and equal grip strength   Neurological: She is alert and oriented to person, place, and time. No cranial nerve deficit.  Speech is clear and goal oriented, follows commands Sensation normal to light touch Moves extremities without ataxia, coordination intact Unable to ambulate pt  Skin: Skin is warm and dry. She is not diaphoretic. No erythema.    ED Course  Procedures (including critical care time)  Labs Reviewed  GLUCOSE, CAPILLARY - Abnormal; Notable for the following:    Glucose-Capillary 130 (*)    All other components within normal limits  CBC - Abnormal; Notable for the following:    RBC 3.68 (*)    MCV 101.6 (*)    MCH 35.6 (*)    Platelets 132 (*)    All other components within normal limits  COMPREHENSIVE METABOLIC PANEL - Abnormal; Notable for the following:    BUN 5 (*)    Albumin 3.4 (*)    AST 75 (*)    All other components within normal limits  ETHANOL  URINE RAPID DRUG SCREEN (HOSP PERFORMED)   Ct Head Wo  Contrast  01/27/2013  *RADIOLOGY REPORT*  Clinical Data: Seizures.  CT HEAD WITHOUT CONTRAST  Technique:  Contiguous axial images were obtained from the base of the skull through the vertex without contrast.  Comparison: CT head without contrast 01/13/2011.  Findings: No acute cortical infarct, hemorrhage, mass lesion is present.  The ventricles are of normal size.  No significant extra- axial fluid collection is present.  The paranasal sinuses and mastoid air cells are clear.  The osseous skull is intact.  No significant extracranial soft tissue injury is evident.  IMPRESSION:  1.  Negative CT of head.   Original Report Authenticated By: Marin Roberts, M.D.    Medications  LORazepam (ATIVAN) tablet 1 mg (not administered)    Or  LORazepam (ATIVAN) injection 1 mg (not administered)  thiamine (VITAMIN B-1) tablet 100 mg (not administered)    Or  thiamine (B-1) injection 100 mg (not  administered)  folic acid (FOLVITE) tablet 1 mg (not administered)  multivitamin with minerals tablet 1 tablet (not administered)  LORazepam (ATIVAN) tablet 0-4 mg (2 mg Oral Given 01/27/13 2213)    Followed by  LORazepam (ATIVAN) tablet 0-4 mg (not administered)  LORazepam (ATIVAN) injection 1 mg (1 mg Intravenous Given 01/27/13 1941)  LORazepam (ATIVAN) injection 2 mg (2 mg Intravenous Given 01/27/13 2213)     1. Seizure   2. Alcohol withdrawal seizure       MDM  52 y.o. Female presents s/p two seizures today likely alcohol withdrawal related as she drank 120 oz of beer a day regularly and quit 3 days ago. CBG, CBC, CMP, ethanol, and drug screen not concerning. Head CT negative. Vitals WNL. Pt responding well to ativan, feeling and looking better on re-evaluation. Pt seen by Dr. Patria Mane who will arrange for admit to telemetry for observation.   Glade Nurse, PA-C 01/27/13 2303

## 2013-01-27 NOTE — ED Notes (Signed)
MD at bedside. 

## 2013-01-27 NOTE — ED Notes (Signed)
Pt comes from home by EMS she has had two seizures today. The first one fell onto dinning room chair and broke chair hitting head. She was dx year ago with seizures and not currently on any meds for them. Per pt's room mate, pt has never had two in one day. Last seizure was month and half ago.  Pt is nauseated and vomiting. Pt was 8mg  Zofran in route by EMS.

## 2013-01-27 NOTE — ED Notes (Signed)
Benny-Nephew [phone]

## 2013-01-27 NOTE — ED Notes (Signed)
Patient transported to CT. Will draw labs when pt returns

## 2013-01-27 NOTE — ED Provider Notes (Signed)
Medical screening examination/treatment/procedure(s) were conducted as a shared visit with non-physician practitioner(s) and myself.  I personally evaluated the patient during the encounter  I suspect this is alcohol lateral seizures.  Ativan given.  She was core 14 after 1 mg of IV Ativan.  The patient admitted the hospital.  Placed on alcohol withdrawal protocol  Lyanne Co, MD 01/27/13 2305

## 2013-01-28 ENCOUNTER — Other Ambulatory Visit (HOSPITAL_COMMUNITY): Payer: Self-pay

## 2013-01-28 ENCOUNTER — Inpatient Hospital Stay (HOSPITAL_COMMUNITY): Payer: Self-pay

## 2013-01-28 MED ORDER — LEVETIRACETAM 750 MG PO TABS
750.0000 mg | ORAL_TABLET | Freq: Two times a day (BID) | ORAL | Status: DC
  Administered 2013-01-28 – 2013-01-30 (×4): 750 mg via ORAL
  Filled 2013-01-28 (×6): qty 1

## 2013-01-28 MED ORDER — LORAZEPAM 2 MG/ML IJ SOLN
1.0000 mg | Freq: Four times a day (QID) | INTRAMUSCULAR | Status: DC | PRN
  Administered 2013-01-28 – 2013-01-29 (×4): 1 mg via INTRAVENOUS
  Filled 2013-01-28 (×4): qty 1

## 2013-01-28 MED ORDER — ADULT MULTIVITAMIN W/MINERALS CH
1.0000 | ORAL_TABLET | Freq: Every day | ORAL | Status: DC
  Administered 2013-01-29 – 2013-01-30 (×2): 1 via ORAL
  Filled 2013-01-28 (×2): qty 1

## 2013-01-28 MED ORDER — FOLIC ACID 1 MG PO TABS
1.0000 mg | ORAL_TABLET | Freq: Every day | ORAL | Status: DC
Start: 2013-01-28 — End: 2013-01-28

## 2013-01-28 MED ORDER — VITAMIN B-1 100 MG PO TABS
100.0000 mg | ORAL_TABLET | Freq: Every day | ORAL | Status: DC
  Filled 2013-01-28: qty 1

## 2013-01-28 MED ORDER — THIAMINE HCL 100 MG/ML IJ SOLN
100.0000 mg | Freq: Every day | INTRAMUSCULAR | Status: DC
  Filled 2013-01-28: qty 1

## 2013-01-28 MED ORDER — LORAZEPAM 1 MG PO TABS: 1.0000 mg | ORAL_TABLET | Freq: Four times a day (QID) | ORAL | Status: DC | PRN

## 2013-01-28 MED ORDER — LORAZEPAM 2 MG/ML IJ SOLN
INTRAMUSCULAR | Status: AC
  Filled 2013-01-28: qty 1

## 2013-01-28 MED ORDER — LORAZEPAM 2 MG/ML IJ SOLN
2.0000 mg | Freq: Once | INTRAMUSCULAR | Status: AC
  Administered 2013-01-28: 2 mg via INTRAVENOUS
  Filled 2013-01-28: qty 1

## 2013-01-28 MED ORDER — NICOTINE 21 MG/24HR TD PT24
21.0000 mg | MEDICATED_PATCH | Freq: Every day | TRANSDERMAL | Status: DC
  Administered 2013-01-29 – 2013-01-30 (×2): 21 mg via TRANSDERMAL
  Filled 2013-01-28 (×3): qty 1

## 2013-01-28 MED ORDER — ADULT MULTIVITAMIN W/MINERALS CH
1.0000 | ORAL_TABLET | Freq: Every day | ORAL | Status: DC
  Filled 2013-01-28: qty 1

## 2013-01-28 NOTE — Evaluation (Signed)
Physical Therapy Evaluation Patient Details Name: Cynthia Welch MRN: [id number] DOB: [birth year] Today's Date: 01/28/2013 Time: 7829-5621 PT Time Calculation (min): 12 min  PT Assessment / Plan / Recommendation Clinical Impression  Pt. is 52 yo female admitted 01/27/13 for witnessed seizures. R/o due to ETOH. Pt. is unbalanced during ambulating inhall. Pt. declined to use a ?RW. Pt. will benefit from PT while inacute care to improve safety and mobility.    PT Assessment  Patient needs continued PT services    Follow Up Recommendations  Home health PT    Does the patient have the potential to tolerate intense rehabilitation      Barriers to Discharge        Equipment Recommendations  Rolling walker with 5" wheels    Recommendations for Other Services     Frequency Min 3X/week    Precautions / Restrictions Precautions Precautions: Fall Precaution Comments: sz.   Pertinent Vitals/Pain       Mobility  Bed Mobility Bed Mobility: Supine to Sit Supine to Sit: 7: Independent Transfers Transfers: Sit to Stand;Stand to Sit Sit to Stand: 4: Min assist Stand to Sit: 4: Min guard Details for Transfer Assistance: pt. does use UE for stabilizing to stand. Ambulation/Gait Ambulation/Gait Assistance: 4: Min guard;4: Min Environmental consultant (Feet): 150 Feet Assistive device: 1 person hand held assist Ambulation/Gait Assistance Details: Pt. at times will lose balance and required steady balance assistance. Offered a RW but pt declined. Gait Pattern: Step-to pattern;Decreased stride length;Decreased trunk rotation    Exercises     PT Diagnosis: Abnormality of gait  PT Problem List: Decreased balance;Decreased activity tolerance;Decreased safety awareness PT Treatment Interventions: DME instruction;Gait training;Stair training;Functional mobility training;Therapeutic activities;Balance training;Patient/family education   PT Goals Acute Rehab PT Goals PT Goal Formulation:  With patient Time For Goal Achievement: 02/11/13 Potential to Achieve Goals: Good Pt will Ambulate: >150 feet;with modified independence;with least restrictive assistive device PT Goal: Ambulate - Progress: Goal set today Pt will Go Up / Down Stairs: 3-5 stairs;with supervision;with least restrictive assistive device PT Goal: Up/Down Stairs - Progress: Goal set today  Visit Information  Last PT Received On: 01/28/13 Assistance Needed: +1    Subjective Data  Subjective: I don't know why I feel like I am pulling to the side Patient Stated Goal:  to go home today.   Prior Functioning  Home Living Lives With: Significant other Available Help at Discharge: Family Type of Home: Apartment Home Access: Stairs to enter Secretary/administrator of Steps: 2 Entrance Stairs-Rails: None Home Layout: One level Bathroom Shower/Tub: Engineer, manufacturing systems: Standard Home Adaptive Equipment: Bedside commode/3-in-1 Prior Function Level of Independence: Independent Driving: Yes Vocation: Full time employment Communication Communication: No difficulties    Cognition  Cognition Overall Cognitive Status: Difficult to assess Arousal/Alertness: Awake/alert Orientation Level: Appears intact for tasks assessed Behavior During Session: Restless    Extremity/Trunk Assessment Right Upper Extremity Assessment RUE ROM/Strength/Tone: [facility] for tasks assessed Left Upper Extremity Assessment LUE ROM/Strength/Tone: WFL for tasks assessed Right Lower Extremity Assessment RLE ROM/Strength/Tone: WFL for tasks assessed Left Lower Extremity Assessment LLE ROM/Strength/Tone: WFL for tasks assessed   Balance Balance Balance Assessed: Yes Static Standing Balance Static Standing - Balance Support: No upper extremity supported Static Standing - Level of Assistance: 5: Stand by assistance  End of Session PT - End of Session Activity Tolerance: Patient tolerated treatment well Patient left: in  chair;with call bell/phone within reach  GP     Rada Hay 01/28/2013, 5:29 PM

## 2013-01-28 NOTE — Progress Notes (Signed)
Pt is a former Information systems manager pt, I gave her information on the [facility] on [address]. I encouraged her to call and make an appt so that she can establish care with them. She verbalized understanding. This conversation was had in the presence pf pt's husband.  Algernon Huxley RN BSN  [phone]

## 2013-01-28 NOTE — Progress Notes (Signed)
Clinical Social Work  CSW received inappropriate referral to assist with PCP. CSW informed CM of patient needs. CSW is signing off but available if further needs arise.  [place], [place] [phone]

## 2013-01-28 NOTE — Progress Notes (Signed)
Paged EEG technician 3 times during day shift but no one returned my call.

## 2013-01-28 NOTE — Progress Notes (Addendum)
PROGRESS NOTE  Cynthia Welch ZOX:[id number] DOB: October 10, 1961 DOA: 01/27/2013 PCP: No PCP Per Patient  Brief narrative: 61 African American female presented 4.8.14 for a 14 with 2 seizure-like episodes. Sacrum resulted in her falling hitting her mouth and patient seizure activity seemed to persist for 5-10 minutes and she was postictal for 20 minutes. There was rolling and the seizure consisted of her having clenched upper extremities to her chest-this was witnessed by her husband.. She was brought to the emergency room where it was thought that she had alcohol withdrawal seizures.  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  Consultants:  None currently  Procedures:   EEG Pending  MRI pending  Antibiotics:   none   Subjective  Patient feels well patient. Wishes to eat. No nausea no vomiting no further seizure like activity. She seemed somewhat tremulous at bedside    Objective    Interim History: None  Telemetry: And none telemetry  Objective: Filed Vitals:   01/27/13 2158 01/27/13 2207 01/27/13 2340 01/28/13 0600  BP: 132/98 144/86 135/91 111/72  Pulse:  81 87 83  Temp: 98.9 F (37.2 C)  100.2 F (37.9 C) 99.1 F (37.3 C)  TempSrc: Oral  Oral Oral  Resp: 18  20 20   Height:   5\' 9"  (1.753 m)   Weight:   66.815 kg (147 lb 4.8 oz)   SpO2: 100%  96% 98%    Intake/Output Summary (Last 24 hours) at 01/28/13 1028 Last data filed at 01/28/13 0500  Gross per 24 hour  Intake      0 ml  Output    200 ml  Net   -200 ml    Exam:  General: Alert pleasant oriented somewhat tremulous, noted laceration mouth and lip and last 1 to Cardiovascular: Some S2 no murmur rub or gallop Respiratory: Clinically clear Abdomen: Soft nontender nondistended Skin no lower extremity edema Neuro grossly intact moving  all 4 limbs equally power 5/5, sensation intact bilaterally, reflexes are slightly hyperreflexic bilaterally. Finger-nose-finger test is normal, gait is not assessed however nurse reports very unsteady on her feet   Data Reviewed: Basic Metabolic Panel:  Recent Labs Lab 01/27/13 2115  NA 135  K 3.8  CL 99  CO2 23  GLUCOSE 96  BUN 5*  CREATININE 0.57  CALCIUM 9.0   Liver Function Tests:  Recent Labs Lab 01/27/13 2115  AST 75*  ALT 27  ALKPHOS 77  BILITOT 0.5  PROT 7.5  ALBUMIN 3.4*   No results found for this basename: LIPASE, AMYLASE,  in the last 168 hours No results found for this basename: AMMONIA,  in the last 168 hours CBC:  Recent Labs Lab 01/27/13 2115  WBC 5.4  HGB 13.1  HCT 37.4  MCV 101.6*  PLT 132*   Cardiac Enzymes: No results found for this basename: CKTOTAL, CKMB, CKMBINDEX, TROPONINI,  in the last 168 hours BNP: No components found with this basename: POCBNP,  CBG:  Recent Labs Lab 01/27/13 1921  GLUCAP 130*    No results found for this or any previous visit (from the past 240 hour(s)).   Studies:              All Imaging reviewed and is as per above notation   Scheduled Meds: . sodium chloride   Intravenous STAT  . enoxaparin (LOVENOX) injection  40 mg Subcutaneous Q24H  . folic acid  1 mg Oral Daily  . LORazepam  0-4 mg Oral Q6H   Followed by  . [START ON 01/30/2013] LORazepam  0-4 mg Oral Q12H  . multivitamin with minerals  1 tablet Oral Daily  . sodium chloride  3 mL Intravenous Q12H  . thiamine  100 mg Oral Daily   Or  . thiamine  100 mg Intravenous Daily   Continuous Infusions: . sodium chloride 100 mL/hr at 01/28/13 0143     Assessment/Plan: 1. Possible seizure disorder-rule out other organic causes. Get MRI brain/EEG-these are negative she probably can benefit from outpatient neurology evaluation Would start Keppra 750 twice a day in the interim-I have discontinued her Ativan as I do not believe that this is a  alcohol withdrawal related seizure and she does not need to have CIWA performed 2. Hypertension-currently stable no current need for medications at present 3. Hyperlipidemia outpatient reevaluation 4. History of leukopenia-currently now resolved. Was previously seen by oncology.    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  Called Dr. Venetia Maxon of [facility] recommends no further workup or surgical intervention needed at this time Await EEG  Nursing reports husband states she drinks a pint of ETOH daily-She might have some Wernicke's encepahlopathy as well 2/2 to this  Pleas Koch, MD  [facility] Pager [phone] 01/28/2013, 10:28 AM    LOS: 1 day

## 2013-01-29 ENCOUNTER — Inpatient Hospital Stay (HOSPITAL_COMMUNITY): Payer: MEDICAID

## 2013-01-29 ENCOUNTER — Encounter (HOSPITAL_COMMUNITY): Payer: Self-pay | Admitting: Internal Medicine

## 2013-01-29 ENCOUNTER — Inpatient Hospital Stay (HOSPITAL_COMMUNITY)
Admit: 2013-01-29 | Discharge: 2013-01-29 | Disposition: A | Discharge status: Home / Self Care | Payer: Self-pay | Attending: Family Medicine | Admitting: Family Medicine

## 2013-01-29 DIAGNOSIS — S065X9A Traumatic subdural hemorrhage with loss of consciousness of unspecified duration, initial encounter: Secondary | ICD-10-CM

## 2013-01-29 DIAGNOSIS — I10 Essential (primary) hypertension: Secondary | ICD-10-CM

## 2013-01-29 DIAGNOSIS — E876 Hypokalemia: Secondary | ICD-10-CM | POA: Diagnosis not present

## 2013-01-29 DIAGNOSIS — S065XAA Traumatic subdural hemorrhage with loss of consciousness status unknown, initial encounter: Secondary | ICD-10-CM

## 2013-01-29 DIAGNOSIS — I6200 Nontraumatic subdural hemorrhage, unspecified: Secondary | ICD-10-CM

## 2013-01-29 HISTORY — DX: Traumatic subdural hemorrhage with loss of consciousness status unknown, initial encounter: S06.5XAA

## 2013-01-29 HISTORY — DX: Traumatic subdural hemorrhage with loss of consciousness of unspecified duration, initial encounter: S06.5X9A

## 2013-01-29 LAB — BASIC METABOLIC PANEL
BUN: 5 mg/dL — ABNORMAL LOW (ref 6–23)
Calcium: 8.8 mg/dL (ref 8.4–10.5)
GFR calc Af Amer: >90 mL/min (ref >90)
GFR calc non Af Amer: >90 mL/min (ref >90)
Glucose, Bld: 100 mg/dL — ABNORMAL HIGH (ref 70–99)
Potassium: 3.1 mEq/L — ABNORMAL LOW (ref 3.5–5.1)
Sodium: 137 mEq/L (ref 135–145)

## 2013-01-29 LAB — MAGNESIUM: Magnesium: 1.8 mg/dL (ref 1.5–2.5)

## 2013-01-29 LAB — CBC
Hemoglobin: 12.3 g/dL (ref 12.0–15.0)
MCH: 36 pg — ABNORMAL HIGH (ref 26.0–34.0)
MCHC: 35 g/dL (ref 30.0–36.0)
Platelets: 111 10*3/uL — ABNORMAL LOW (ref 150–400)

## 2013-01-29 MED ORDER — POTASSIUM CHLORIDE CRYS ER 20 MEQ PO TBCR
40.0000 meq | EXTENDED_RELEASE_TABLET | ORAL | Status: AC
  Administered 2013-01-29 (×2): 40 meq via ORAL
  Filled 2013-01-29 (×2): qty 2

## 2013-01-29 MED ORDER — HALOPERIDOL LACTATE 5 MG/ML IJ SOLN
2.5000 mg | Freq: Once | INTRAMUSCULAR | Status: AC
  Administered 2013-01-29: 2.5 mg via INTRAVENOUS
  Filled 2013-01-29: qty 1

## 2013-01-29 MED ORDER — MAGNESIUM SULFATE 40 MG/ML IJ SOLN
2.0000 g | Freq: Once | INTRAMUSCULAR | Status: AC
  Administered 2013-01-29: 2 g via INTRAVENOUS
  Filled 2013-01-29: qty 50

## 2013-01-29 NOTE — Progress Notes (Signed)
Clinical Social Work Department BRIEF PSYCHOSOCIAL ASSESSMENT 01/29/2013  Patient:  [facility]     Account Number:  [id number]     Admit date:  01/27/2013  Clinical Social Worker:  Dennison Bulla  Date/Time:  01/29/2013 09:00 AM  Referred by:  Physician  Date Referred:  01/29/2013 Referred for  Substance Abuse   Other Referral:   Interview type:  Patient Other interview type:    PSYCHOSOCIAL DATA Living Status:  FRIEND(S) Admitted from facility:   Level of care:   Primary support name:  Kelvin Primary support relationship to patient:  FRIEND Degree of support available:   Adequate    CURRENT CONCERNS Current Concerns  Substance Abuse   Other Concerns:    SOCIAL WORK ASSESSMENT / PLAN CSW received referral to assess patient for substance abuse. CSW reviewed chart and met with patient at bedside. No visitors present but sitter in room and reports that patient has been confused today.    Patient reports that she lives with roommates and works at [employer]  Patient reports that she works a lot and is at home very little. Patient reports that she has two roommates and they all work together to assist each other. Patient was independent in her ADLs prior to admission.    Patient reports that she was told she had a seizure and that is why she was admitted to the hospital. Patient reports that MD did state that seizure could be related to alcohol use. Patient reports she might drink 1 beer when watching tv but later in assessment reports that she has shots sometimes with mixed drinks. Patient remains confused and is unable to provide accurate information at this time.    CSW will follow up in order to complete SBIRT and offer substance abuse resources.   Assessment/plan status:  Psychosocial Support/Ongoing Assessment of Needs Other assessment/ plan:   Information/referral to community resources:   Patient needs follow up when more alert in order to understand resources     PATIENT'S/FAMILY'S RESPONSE TO PLAN OF CARE: Patient sitting in room and trying to put her shoes on in order to leave the hospital. Patient is confused and does not understand why she cannot leave. Patient able to provide background information but unsure how accurate information is in her current state.

## 2013-01-29 NOTE — Progress Notes (Addendum)
Pt non-compliant will not remain in bed, although she is very unsteady. Pt refused her 2200 medication. Pt educated on the importance of taking her medication, pt still refused. Sitter at the bedside. Pt attempting to pull out IV access and leave the unit. MD paged. New orders initiated.

## 2013-01-29 NOTE — Progress Notes (Signed)
EEG completed.

## 2013-01-29 NOTE — Progress Notes (Signed)
Patient attempting to leave floor via elevator, gait is very unsteady.  Directed back to her room, explaining she could not leave the floor, had been given some sedation that was going to make her sleepy.  She states she does not want to stay her, we discussed her reason for being a patient at the hospital and her doctor would need to evaluate her on a daily basis to clear her for discharge.  Notified Dr. Janee Morn of her desire to leave.

## 2013-01-29 NOTE — Consult Note (Signed)
NEURO HOSPITALIST CONSULT NOTE    Reason for Consult: seizure  HPI:                                                                                                                                          Cynthia Welch is an 52 y.o. female who was brought to [facility] hospital after witnessed seizure by husband. Patient has no recollection of what happened other than her husband bending over her and yelling her name.  She states she was brought to the ED "about one year ago for similar episode and set up with out patient neurology."  Due to having no further symptoms she did not follow up with neurologist. She admits to drinking " pounding back a few per day" but states she has had no ETOH in the last few days.  She is on no home medications, denies febrile seizure or family history of seizure. She states she was a normal vaginal birth. Initial CT head was normal but follow up MRI shows shallow bilateral subdural collections greater on the right most consistent with subdural hematomas with maximal thickness anterior right frontal lobe of 5.2 mm. At present time she is alert but telling me she is in a school.   Question pseudoseizure back in 2011  Past Medical History  Diagnosis Date  . PONV (postoperative nausea and vomiting)   . Seizures     ABOU T  1  YR  AGO.......NO PROBLEMS SINCE  . Fibroids     Past Surgical History  Procedure Laterality Date  . Orif ulnar fracture  09/03/2011    Procedure: OPEN REDUCTION INTERNAL FIXATION (ORIF) ULNAR FRACTURE;  Surgeon: Cammy Copa;  Location: [facility] OR;  Service: Orthopedics;  Laterality: Right;  . Orif ulnar fracture  12/14/2011    Procedure: OPEN REDUCTION INTERNAL FIXATION (ORIF) ULNAR FRACTURE;  Surgeon: Cammy Copa, MD;  Location: [facility] OR;  Service: Orthopedics;  Laterality: Right;  Right ulnar shaft open reduction, internal fixation with right iliac crest bone graft      Family History: Mother: HTN Father: HTN,  heart problems  Social History:  reports that she has been smoking Cigarettes.  She has a 3.75 pack-year smoking history. She does not have any smokeless tobacco history on file. She reports that she drinks about 3.6 ounces of alcohol per week. She reports that she does not use illicit drugs.  Allergies  Allergen Reactions  . Oxycodone Hcl Itching  . Acetaminophen     REACTION: itching    MEDICATIONS:  Prior to Admission:  No prescriptions prior to admission   Scheduled: . enoxaparin (LOVENOX) injection  40 mg Subcutaneous Q24H  . folic acid  1 mg Oral Daily  . levETIRAcetam  750 mg Oral BID  . multivitamin with minerals  1 tablet Oral Daily  . nicotine  21 mg Transdermal Daily  . sodium chloride  3 mL Intravenous Q12H  . thiamine  100 mg Oral Daily   Or  . thiamine  100 mg Intravenous Daily     ROS:                                                                                                                                       History obtained from the patient  General ROS: negative for - chills, fatigue, fever, night sweats, weight gain or weight loss Psychological ROS: negative for - behavioral disorder, hallucinations, memory difficulties, mood swings or suicidal ideation Ophthalmic ROS: negative for - blurry vision, double vision, eye pain or loss of vision ENT ROS: negative for - epistaxis, nasal discharge, oral lesions, sore throat, tinnitus or vertigo Allergy and Immunology ROS: negative for - hives or itchy/watery eyes Hematological and Lymphatic ROS: negative for - bleeding problems, bruising or swollen lymph nodes Endocrine ROS: negative for - galactorrhea, hair pattern changes, polydipsia/polyuria or temperature intolerance Respiratory ROS: negative for - cough, hemoptysis, shortness of breath or wheezing Cardiovascular ROS: negative for - chest  pain, dyspnea on exertion, edema or irregular heartbeat Gastrointestinal ROS: negative for - abdominal pain, diarrhea, hematemesis, nausea/vomiting or stool incontinence Genito-Urinary ROS: negative for - dysuria, hematuria, incontinence or urinary frequency/urgency Musculoskeletal ROS: negative for - joint swelling or muscular weakness Neurological ROS: as noted in HPI Dermatological ROS: negative for rash and skin lesion changes   Blood pressure 137/87, pulse 67, temperature 98.1 F (36.7 C), temperature source Oral, resp. rate 18, height 5\' 9"  (1.753 m), weight 66.815 kg (147 lb 4.8 oz), SpO2 100.00%.   Neurologic Examination:                                                                                                      Mental Status: Alert, although she initially told me she is in the hospital and is able to give some history about the ambulance ride to the hospital when formly questioned she states she is in a school, it is 71 and the moth is march.  Speech fluent without evidence of aphasia.  Able to follow 3 step commands without  difficulty. Cranial Nerves: II: Discs flat bilaterally; Visual fields grossly normal, pupils equal, round, reactive to light and accommodation III,IV, VI: mild ptosis left eye present, extra-ocular motions intact bilaterally V,VII: smile symmetric, facial light touch sensation normal bilaterally VIII: hearing normal bilaterally IX,X: gag reflex present XI: bilateral shoulder shrug XII: midline tongue extension Motor: Right : Upper extremity   5/5    Left:     Upper extremity   5/5  Lower extremity   5/5     Lower extremity   5/5 Tone and bulk:normal tone throughout; no atrophy noted Sensory: Pinprick and light touch intact throughout, bilaterally, decreased sensation to cold from foot to mid calf and intact proprioception Deep Tendon Reflexes: 2+ and symmetric throughout UE, 1+ bilateral KJ and no AK Plantars: Mute  bilaterally Cerebellar: normal finger-to-nose,  normal heel-to-shin test CV: pulses palpable throughout    Lab Results  Component Value Date/Time   CHOL 262* 08/26/2010  8:29 PM    Results for orders placed during the hospital encounter of 01/27/13 (from the past 48 hour(s))  GLUCOSE, CAPILLARY     Status: Abnormal   Collection Time    01/27/13  7:21 PM      Result Value Range   Glucose-Capillary 130 (*) 70 - 99 mg/dL   Comment 1 Documented in Chart     Comment 2 Notify RN    ETHANOL     Status: None   Collection Time    01/27/13  9:15 PM      Result Value Range   Alcohol, Ethyl (B) <11  0 - 11 mg/dL   Comment:            LOWEST DETECTABLE LIMIT FOR     SERUM ALCOHOL IS 11 mg/dL     FOR MEDICAL PURPOSES ONLY  CBC     Status: Abnormal   Collection Time    01/27/13  9:15 PM      Result Value Range   WBC 5.4  4.0 - 10.5 K/uL   RBC 3.68 (*) 3.87 - 5.11 MIL/uL   Hemoglobin 13.1  12.0 - 15.0 g/dL   HCT 16.1  09.6 - 04.5 %   MCV 101.6 (*) 78.0 - 100.0 fL   MCH 35.6 (*) 26.0 - 34.0 pg   MCHC 35.0  30.0 - 36.0 g/dL   RDW 40.9  81.1 - 91.4 %   Platelets 132 (*) 150 - 400 K/uL  COMPREHENSIVE METABOLIC PANEL     Status: Abnormal   Collection Time    01/27/13  9:15 PM      Result Value Range   Sodium 135  135 - 145 mEq/L   Potassium 3.8  3.5 - 5.1 mEq/L   Chloride 99  96 - 112 mEq/L   CO2 23  19 - 32 mEq/L   Glucose, Bld 96  70 - 99 mg/dL   BUN 5 (*) 6 - 23 mg/dL   Creatinine, Ser 7.82  0.50 - 1.10 mg/dL   Calcium 9.0  8.4 - 95.6 mg/dL   Total Protein 7.5  6.0 - 8.3 g/dL   Albumin 3.4 (*) 3.5 - 5.2 g/dL   AST 75 (*) 0 - 37 U/L   ALT 27  0 - 35 U/L   Alkaline Phosphatase 77  39 - 117 U/L   Total Bilirubin 0.5  0.3 - 1.2 mg/dL   GFR calc non Af Amer >90  >90 mL/min   GFR calc Af Amer >90  >90 mL/min  Comment:            The eGFR has been calculated     using the CKD EPI equation.     This calculation has not been     validated in all clinical     situations.      eGFR's persistently     <90 mL/min signify     possible Chronic Kidney Disease.  URINE RAPID DRUG SCREEN (HOSP PERFORMED)     Status: None   Collection Time    01/27/13  9:50 PM      Result Value Range   Opiates NONE DETECTED  NONE DETECTED   Cocaine NONE DETECTED  NONE DETECTED   Benzodiazepines NONE DETECTED  NONE DETECTED   Amphetamines NONE DETECTED  NONE DETECTED   Tetrahydrocannabinol NONE DETECTED  NONE DETECTED   Barbiturates NONE DETECTED  NONE DETECTED   Comment:            DRUG SCREEN FOR MEDICAL PURPOSES     ONLY.  IF CONFIRMATION IS NEEDED     FOR ANY PURPOSE, NOTIFY LAB     WITHIN 5 DAYS.                LOWEST DETECTABLE LIMITS     FOR URINE DRUG SCREEN     Drug Class       Cutoff (ng/mL)     Amphetamine      1000     Barbiturate      200     Benzodiazepine   200     Tricyclics       300     Opiates          300     Cocaine          300     THC              50    Ct Head Wo Contrast  01/27/2013  *RADIOLOGY REPORT*  Clinical Data: Seizures.  CT HEAD WITHOUT CONTRAST  Technique:  Contiguous axial images were obtained from the base of the skull through the vertex without contrast.  Comparison: CT head without contrast 01/13/2011.  Findings: No acute cortical infarct, hemorrhage, mass lesion is present.  The ventricles are of normal size.  No significant extra- axial fluid collection is present.  The paranasal sinuses and mastoid air cells are clear.  The osseous skull is intact.  No significant extracranial soft tissue injury is evident.  IMPRESSION:  1.  Negative CT of head.   Original Report Authenticated By: Marin Roberts, M.D.    Mr Brain Wo Contrast  01/28/2013  *RADIOLOGY REPORT*  Clinical Data: Seizures yesterday fell and hit head.  History of seizure disorder, chronic alcohol use, hypertension and hyperlipidemia.  MRI HEAD WITHOUT CONTRAST  Technique:  Multiplanar, multiecho pulse sequences of the brain and surrounding structures were obtained according to  standard protocol without intravenous contrast.  Comparison: 01/27/2013 CT.  Findings: No acute infarct.  Shallow bilateral subdural collections greater on the right most consistent with subdural hematomas with maximal thickness anterior right frontal lobe of 5.2 mm.  These subdural collections appear slightly bright on diffusion sequence.  Subdural empyema could therefore not be completely excluded but does not appear to be of clinical concern.  Age of the subdural hematomas is indeterminate but may be related to patient's recent fall.  Mild small vessel disease type changes.  Global atrophy without hydrocephalus.  No intracranial mass lesion seen separate from  above described findings.  Major intracranial vascular structures are patent.  Paranasal sinus mucosal thickening most notable left maxillary sinus.  No evidence of mesial temporal sclerosis.  IMPRESSION: Shallow bilateral subdural collections greater on the right most consistent with subdural hematomas with maximal thickness anterior right frontal lobe of 5.2 mm.  Atrophy.  Please see above.  This has been made a PRA call report utilizing dashboard call feature.   Original Report Authenticated By: Lacy Duverney, M.D.    Assessment and plan discussed with with attending physician and they are in agreement.    Felicie Morn PA-C [facility] [phone]  01/29/2013, 8:22 AM  Patient seen and examined.  Clinical course and management discussed.  Necessary edits performed.  I agree with the above.  Assessment and plan of care developed and discussed below.    Assessment/Plan: 52 year old female presenting with seizure.  Initial head CT unremarkable but MRI shows small bilateral SDHs.  History from patient and husband changes on each account.  Unclear when patient's last drink was and how much she actually drinks.  Is somewhat confused on exam but husband reports patient is at baseline.  There is a slight increase in LFT's.  It seems patient  had a seizure-like episode about a year ago but patient reports this was very different and in fact patient incurred injuries with these events that she did not a year ago.  Patient on Keppra at 750mg  BID.  EEG shows no epileptiform activity.    Recommendations: 1.  Continue Keppra at current dose 2.  Seizure precautions.  Patient unable to drive, operate heavy machinery, perform activities at heights and participate in water activities until release by outpatient physician. 3.  Repeat head CT in AM to confirm that [facility] are not increasing in size.  If CT shows no increase in size patient may be discharged to follow up on an outpatient basis.  Patient should continue Keppra.     Thana Farr, MD [facility] [phone]  01/29/2013  6:18 PM

## 2013-01-29 NOTE — Progress Notes (Signed)
[facility] PROGRESS NOTE  Cynthia Welch ZOX:[id number] DOB: Sep 27, 1961 DOA: 01/27/2013 PCP: No PCP Per Patient  Assessment/Plan: #1 seizure disorder Likely secondary to subdural hematomas noted on the MRI versus questionable etiology. MRI of the brain with bilateral small subdural hematomas. EEG is pending. Patient with no noted seizures overnight. Patient has been started on Keppra 750 mg twice daily. We'll also place him and see what protocol. Will consult with neurology for further evaluation and management.  #2 hypertension Stable. Follow.  #3 hypokalemia Replete.  #4 bilateral subdural hematomas Dr. Mahala Menghini  discussed results with Dr. Venetia Maxon of neurosurgery who recommended no further workup/surgical intervention was needed at this time.  Code Status: Full Family Communication: Updated patient and family at bedside. Disposition Plan: Home when medically stable.   Consultants:  Neurology pending  Procedures:  EEG 01/29/2013  CT head 01/27/2013  MRI head 01/28/2013  Antibiotics:    HPI/Subjective: Patient with no complaints. No witnessed seizures overnight. Patient dressed and wants to go home.  Objective: Filed Vitals:   01/28/13 0600 01/28/13 1530 01/28/13 2100 01/29/13 0535  BP: 111/72 109/79 116/78 137/87  Pulse: 83 86 77 67  Temp: 99.1 F (37.3 C) 98.1 F (36.7 C) 98.5 F (36.9 C) 98.1 F (36.7 C)  TempSrc: Oral Oral Oral Oral  Resp: 20 19 18 18   Height:      Weight:      SpO2: 98% 100% 100% 100%    Intake/Output Summary (Last 24 hours) at 01/29/13 1406 Last data filed at 01/29/13 1200  Gross per 24 hour  Intake    400 ml  Output      0 ml  Net    400 ml   Filed Weights   01/27/13 2340  Weight: 66.815 kg (147 lb 4.8 oz)    Exam:   General:  NAD  Cardiovascular: RRR  Respiratory: CTAB  Abdomen: Soft, nontender, nondistended, positive bowel sounds  Musculoskeletal: 5/5 BUE strength, 5/5 BLE strength  Data  Reviewed: Basic Metabolic Panel:  Recent Labs Lab 01/27/13 2115 01/29/13 0826  NA 135 137  K 3.8 3.1*  CL 99 103  CO2 23 24  GLUCOSE 96 100*  BUN 5* 5*  CREATININE 0.57 0.55  CALCIUM 9.0 8.8  MG  --  1.8   Liver Function Tests:  Recent Labs Lab 01/27/13 2115  AST 75*  ALT 27  ALKPHOS 77  BILITOT 0.5  PROT 7.5  ALBUMIN 3.4*   No results found for this basename: LIPASE, AMYLASE,  in the last 168 hours No results found for this basename: AMMONIA,  in the last 168 hours CBC:  Recent Labs Lab 01/27/13 2115 01/29/13 0826  WBC 5.4 4.4  HGB 13.1 12.3  HCT 37.4 35.1*  MCV 101.6* 102.6*  PLT 132* 111*   Cardiac Enzymes: No results found for this basename: CKTOTAL, CKMB, CKMBINDEX, TROPONINI,  in the last 168 hours BNP (last 3 results) No results found for this basename: PROBNP,  in the last 8760 hours CBG:  Recent Labs Lab 01/27/13 1921  GLUCAP 130*    No results found for this or any previous visit (from the past 240 hour(s)).   Studies: Ct Head Wo Contrast  01/27/2013  *RADIOLOGY REPORT*  Clinical Data: Seizures.  CT HEAD WITHOUT CONTRAST  Technique:  Contiguous axial images were obtained from the base of the skull through the vertex without contrast.  Comparison: CT head without contrast 01/13/2011.  Findings: No acute cortical infarct, hemorrhage, mass lesion is present.  The ventricles are of normal size.  No significant extra- axial fluid collection is present.  The paranasal sinuses and mastoid air cells are clear.  The osseous skull is intact.  No significant extracranial soft tissue injury is evident.  IMPRESSION:  1.  Negative CT of head.   Original Report Authenticated By: Marin Roberts, M.D.    Mr Brain Wo Contrast  01/28/2013  *RADIOLOGY REPORT*  Clinical Data: Seizures yesterday fell and hit head.  History of seizure disorder, chronic alcohol use, hypertension and hyperlipidemia.  MRI HEAD WITHOUT CONTRAST  Technique:  Multiplanar, multiecho pulse  sequences of the brain and surrounding structures were obtained according to standard protocol without intravenous contrast.  Comparison: 01/27/2013 CT.  Findings: No acute infarct.  Shallow bilateral subdural collections greater on the right most consistent with subdural hematomas with maximal thickness anterior right frontal lobe of 5.2 mm.  These subdural collections appear slightly bright on diffusion sequence.  Subdural empyema could therefore not be completely excluded but does not appear to be of clinical concern.  Age of the subdural hematomas is indeterminate but may be related to patient's recent fall.  Mild small vessel disease type changes.  Global atrophy without hydrocephalus.  No intracranial mass lesion seen separate from above described findings.  Major intracranial vascular structures are patent.  Paranasal sinus mucosal thickening most notable left maxillary sinus.  No evidence of mesial temporal sclerosis.  IMPRESSION: Shallow bilateral subdural collections greater on the right most consistent with subdural hematomas with maximal thickness anterior right frontal lobe of 5.2 mm.  Atrophy.  Please see above.  This has been made a PRA call report utilizing dashboard call feature.   Original Report Authenticated By: Lacy Duverney, M.D.     Scheduled Meds: . enoxaparin (LOVENOX) injection  40 mg Subcutaneous Q24H  . folic acid  1 mg Oral Daily  . levETIRAcetam  750 mg Oral BID  . magnesium sulfate 1 - 4 g bolus IVPB  2 g Intravenous Once  . multivitamin with minerals  1 tablet Oral Daily  . nicotine  21 mg Transdermal Daily  . potassium chloride  40 mEq Oral Q4H  . sodium chloride  3 mL Intravenous Q12H  . thiamine  100 mg Oral Daily   Or  . thiamine  100 mg Intravenous Daily   Continuous Infusions:   Principal Problem:   SEIZURE DISORDER Active Problems:   HYPERCHOLESTEROLEMIA   TOBACCO ABUSE   HYPERTENSION, BENIGN ESSENTIAL   Hypokalemia   Subdural hematoma    Time spent:  > 35 mins    [facility]  [facility] Pager [phone]. If 7PM-7AM, please contact night-coverage at [email or web address], password [facility] 01/29/2013, 2:06 PM  LOS: 2 days

## 2013-01-29 NOTE — Progress Notes (Signed)
PT Cancellation Note  Patient Details Name: Cynthia Welch MRN: [id number] DOB: November 27, 1960   Cancelled Treatment:    Reason Eval/Treat Not Completed: Medical issues which prohibited therapy. Pt has ben more confused. Unable to perform balance assessment.    Rada Hay 01/29/2013, 11:03 AM Blanchard Kelch PT [phone]

## 2013-01-29 NOTE — Progress Notes (Signed)
Occupational Therapy Evaluation Patient Details Name: Cynthia Welch MRN: [id number] DOB: 1961-10-12 Today's Date: 01/29/2013 Time: 4098-1191 OT Time Calculation (min): 25 min  OT Assessment / Plan / Recommendation Clinical Impression  52 yo s/p seizures. Pt with ETOH abuse. MRI (+) B frontal SDH. R (@5mm )>L.  PTA, pt lived with husband and worked full time at [employer] At this time, pt with significant cognitive deficits, particularly attention, memory, awareness and executive functioning skills. Pt also with significant balance deficits, which are exacerbated by cognitive deficits. Pt unsafe to D/C home at this time. Discussed cognitive deficits with husband, who states that after a seizure, his wife is usually confused for several days. However, current cognitive deficits possibly related to SDH given level of deficits. At this time, rec HHOT at time of D/C with 24/7 S. Will follow acutely to max safety and independence with ADL and functional mobility for ADl to faciliate d/C home with 24/7 S.    OT Assessment  Patient needs continued OT Services    Follow Up Recommendations  Home health OT    Barriers to Discharge None    Equipment Recommendations  None recommended by OT    Recommendations for Other Services    Frequency  Min 3X/week    Precautions / Restrictions Precautions Precautions: Fall Precaution Comments: seizures (cognitive deficits)   Pertinent Vitals/Pain no apparent distress     ADL  Eating/Feeding: Modified independent Where Assessed - Eating/Feeding: Edge of bed Grooming: Supervision/safety;Set up Where Assessed - Grooming: Supported standing Upper Body Bathing: Supervision/safety;Set up Where Assessed - Upper Body Bathing: Unsupported sitting Lower Body Bathing: Set up;Min guard Where Assessed - Lower Body Bathing: Supported sit to stand Upper Body Dressing: Set up Where Assessed - Upper Body Dressing: Unsupported sitting Lower Body Dressing: Min  guard Where Assessed - Lower Body Dressing: Supported sit to stand Toilet Transfer: Hydrographic surveyor Method: Sit to Barista: Comfort height toilet Equipment Used: Gait belt Transfers/Ambulation Related to ADLs: min A ADL Comments: affected by dognitive deficits and impaired balance    OT Diagnosis: Cognitive deficits;Altered mental status  OT Problem List: Decreased activity tolerance;Impaired balance (sitting and/or standing);Decreased cognition;Decreased safety awareness;Decreased knowledge of use of DME or AE OT Treatment Interventions: Self-care/ADL training;Therapeutic activities;Cognitive remediation/compensation;Patient/family education;Balance training   OT Goals Acute Rehab OT Goals OT Goal Formulation: With patient/family Time For Goal Achievement: 02/12/13 Potential to Achieve Goals: Good ADL Goals Pt Will Transfer to Toilet: with supervision;with caregiver independent in assisting;Ambulation;Comfort height toilet ADL Goal: Toilet Transfer - Progress: Goal set today Pt Will Perform Toileting - Clothing Manipulation: with modified independence;Sitting on 3-in-1 or toilet;Standing ADL Goal: Toileting - Clothing Manipulation - Progress: Goal set today Additional ADL Goal #1: complete ADL activity in minimally distractive environment with min vc for redirection and task completion. ADL Goal: Additional Goal #1 - Progress: Goal set today Miscellaneous OT Goals Miscellaneous OT Goal #1: Pts husband will verbalize most effective approach regarding communication with pt to avoid agitation and increase goal directed behavior. OT Goal: Miscellaneous Goal #1 - Progress: Goal set today Miscellaneous OT Goal #2: Pt will complete functional mobility task during ADL @ S for balance. OT Goal: Miscellaneous Goal #2 - Progress: Goal set today Miscellaneous OT Goal #3: Pt will demonstrate emergent awareness level during ADL task. OT Goal: Miscellaneous Goal #3  - Progress: Goal set today  Visit Information  Last OT Received On: 01/29/13 Assistance Needed: +1    Subjective Data  Prior Functioning     Home Living Lives With: Spouse Available Help at Discharge: Family Type of Home: Apartment Home Access: Stairs to enter Secretary/administrator of Steps: 3 Entrance Stairs-Rails: None Home Layout: One level Bathroom Shower/Tub: Engineer, manufacturing systems: Standard Bathroom Accessibility: Yes How Accessible: Accessible via walker Home Adaptive Equipment: Shower chair with back;Hand-held shower hose Prior Function Level of Independence: Independent Able to Take Stairs?: Yes Driving: Yes Vocation: Full time employment Comments: worked at [employer] Changing jobs Communication Communication: Other (comment) (cognitive deficits) Dominant Hand: Right         Vision/Perception Vision - History Baseline Vision: Wears glasses all the time Patient Visual Report: No change from baseline;Other (comment) (difficult to assess due to cognition at this time) Vision - Assessment Eye Alignment: Within Functional Limits Perception Perception: Within Functional Limits Praxis Praxis: Impaired Praxis Impairment Details: Perseveration (perseverating on going  home)   Cognition  Cognition Overall Cognitive Status: Impaired Area of Impairment: Attention;Memory;Safety/judgement;Awareness of errors;Awareness of deficits;Problem solving;Executive functioning Arousal/Alertness: Awake/alert Orientation Level: Disoriented to;Place;Time Behavior During Session: Restless Current Attention Level: Sustained Attention - Other Comments: internally and externally distracted Memory: Decreased recall of precautions Memory Deficits: unable to name of hospital after 3 repetitions Safety/Judgement: Decreased awareness of safety precautions;Decreased safety judgement for tasks assessed;Impulsive;Decreased awareness of need for  assistance Safety/Judgement - Other Comments: thinks she is ready to go home Awareness of Errors: Assistance required to identify errors made;Assistance required to correct errors made (intellectual awareness) Awareness of Errors - Other Comments: poor awareness  Awareness of Deficits: poor awareness of deficits Problem Solving: mod A for functional basic Executive Functioning: apparently impaired. poor self monitoring. slow porcessing and reaction time    Extremity/Trunk Assessment Right Upper Extremity Assessment RUE ROM/Strength/Tone: WFL for tasks assessed RUE Sensation: WFL - Light Touch;WFL - Proprioception RUE Coordination: WFL - gross/fine motor Left Upper Extremity Assessment LUE ROM/Strength/Tone: Within functional levels LUE Sensation: WFL - Light Touch;WFL - Proprioception LUE Coordination: WFL - gross/fine motor Right Lower Extremity Assessment RLE ROM/Strength/Tone: WFL for tasks assessed Left Lower Extremity Assessment LLE ROM/Strength/Tone: WFL for tasks assessed Trunk Assessment Trunk Assessment: Normal     Mobility Transfers Transfers: Sit to Stand;Stand to Sit Sit to Stand: 4: Min assist Stand to Sit: 4: Min assist Details for Transfer Assistance: unsteady postural control. "flopping"onto bed     Exercise     Balance Balance Balance Assessed: Yes Static Standing Balance Static Standing - Balance Support: No upper extremity supported Static Standing - Level of Assistance: 4: Min assist Dynamic Standing Balance Dynamic Standing - Balance Support: No upper extremity supported Dynamic Standing - Level of Assistance: 4: Min assist High Level Balance High Level Balance Comments: Balance impacted by poor attention. Pt  with significant balance eficits with any distraction. Pt with multiple balance losses while walking and turning head. Pt unaware of balance deficits and how it impacts safety.   End of Session OT - End of Session Equipment Utilized During  Treatment: Gait belt Activity Tolerance: Patient tolerated treatment well Patient left: in bed;with call bell/phone within reach;with nursing in room;with family/visitor present Nurse Communication: Mobility status  GO     Roberth Berling,HILLARY 01/29/2013, 1:34 PM [facility], OTR/L  [phone] 01/29/2013

## 2013-01-30 ENCOUNTER — Inpatient Hospital Stay (HOSPITAL_COMMUNITY): Payer: MEDICAID

## 2013-01-30 LAB — BASIC METABOLIC PANEL
CO2: 24 mEq/L (ref 19–32)
Calcium: 8.7 mg/dL (ref 8.4–10.5)
Creatinine, Ser: 0.48 mg/dL — ABNORMAL LOW (ref 0.50–1.10)
GFR calc Af Amer: >90 mL/min (ref >90)
GFR calc non Af Amer: >90 mL/min (ref >90)
Sodium: 136 mEq/L (ref 135–145)

## 2013-01-30 LAB — MAGNESIUM: Magnesium: 2 mg/dL (ref 1.5–2.5)

## 2013-01-30 MED ORDER — THIAMINE HCL 100 MG PO TABS: 100.0000 mg | ORAL_TABLET | Freq: Every day | ORAL | Status: DC

## 2013-01-30 MED ORDER — FOLIC ACID 1 MG PO TABS: 1.0000 mg | ORAL_TABLET | Freq: Every day | ORAL | Status: DC

## 2013-01-30 MED ORDER — POTASSIUM CHLORIDE CRYS ER 20 MEQ PO TBCR
40.0000 meq | EXTENDED_RELEASE_TABLET | ORAL | Status: DC
  Administered 2013-01-30: 40 meq via ORAL
  Filled 2013-01-30 (×2): qty 2

## 2013-01-30 MED ORDER — LEVETIRACETAM 750 MG PO TABS: 750.0000 mg | ORAL_TABLET | Freq: Two times a day (BID) | ORAL | Status: DC

## 2013-01-30 MED ORDER — ADULT MULTIVITAMIN W/MINERALS CH: 1.0000 | ORAL_TABLET | Freq: Every day | ORAL | Status: DC

## 2013-01-30 NOTE — Discharge Summary (Signed)
Physician Discharge Summary  Cynthia Welch YQM:[id number] DOB: April 23, 1961 DOA: 01/27/2013  PCP: No PCP Per Patient  Admit date: 01/27/2013 Discharge date: 01/30/2013  Time spent: 60 minutes  Recommendations for Outpatient Follow-up:  1. Followup with PCP one to 2 weeks post discharge. On followup basic metabolic profile will need to be checked to followup on electrolytes and renal function. 2. Patient is to followup with neurology 2 weeks post discharge. Patient's seizures will need to be readdressed at that time as well as bilateral subdural hematomas.  Discharge Diagnoses:  Principal Problem:   SEIZURE DISORDER Active Problems:   HYPERCHOLESTEROLEMIA   TOBACCO ABUSE   HYPERTENSION, BENIGN ESSENTIAL   Hypokalemia   Subdural hematoma   Discharge Condition: Stable and improved  Diet recommendation: Regular  Filed Weights   01/27/13 2340  Weight: 66.815 kg (147 lb 4.8 oz)    History of present illness:  52 y.o. Female with past medical history most significant for seizure disorder, chronic alcohol use, hypertension and hyperlipidemia presented to [facility] hospital s/p two seizures today. Pt is a poor historian as she is unsure of course of events. Husband states he was present when both seizures took place and that they took place today. Husband was home but did not witness the first seizure which he says took place around noon. (Pt says it was yesterday). Husband says he heard pt fall and saw her on the dining room floor. Husband witnessed second seizure around 6pm tonight in their bedroom where he saw patient stiffen and start to fall. He caught her. Admits LOC, hitting her head. Other injuries include splitting her lip and breaking a tooth. Admits myalgias. "Everything hurts."  Pt was dx about a year ago with seizures, but has not pursued any follow up and is not on any medication for them.  Hospitalists team was asked to admit the patient.   Hospital Course:  #1 seizure  disorder  Patient was admitted with seizures. Patient was placed on telemetry and followed. CT of the head done on admission was negative. MRI of the head was subsequently done which showed bilateral small subdural hematomas. Patient was empirically started on Keppra 750 mg twice daily and followed. EEG was subsequently also obtained. Patient did not have any further seizures during the hospitalization. A neurology consultation was obtained and patient was seen in consultation by Dr. Thad Ranger. EEG which was done was negative. Neurology recommendation was to continue patient on Keppra 750 mg twice daily and repeat head CT. Repeat head CT which was done on day of discharge did show small bilateral extra-axial fluid collections which were stable with no significant compression of the underlying brain. It was also recommended that patient not drive not offering heavy machinery or perform activities that height and not participating water activities until released by outpatient physician. Dr. Mahala Menghini who was taken care of the patient prior to me spoke with Dr. Venetia Maxon of neurosurgery in terms of patient's subdural hematomas who had recommended no surgical intervention or further workup was needed at this time. Patient did not have any further seizures and patient be discharged home in stable and improved condition.  #2 hypertension   Remained stable throughout the hospitalization.  #3 hypokalemia   patient was noted to be hypokalemic during the hospitalization and her potassium was repleted. Patient will need outpatient followup for repeat basic metabolic profile done as outpatient.  #4 bilateral subdural hematomas   During the workup of patient's seizure disorder MRI of the head was obtained  which showed small bilateral subdural hematomas. Dr. Mahala Menghini discussed results with Dr. Venetia Maxon of neurosurgery who recommended no further workup/surgical intervention was needed at this time.  repeat head CT was done on  01/30/2013 which showed subdural hematomas to be stable. Patient will be discharged in stable and improved condition and will need to followup with neurology as outpatient.      Procedures: EEG 01/29/2013  CT head 01/27/2013  MRI head 01/28/2013   Consultations:  Neurology: Dr. Thad Ranger 01/29/2013  Discharge Exam: Filed Vitals:   01/29/13 0535 01/29/13 1456 01/29/13 2229 01/30/13 0655  BP: 137/87 142/99 130/86 132/89  Pulse: 67   69  Temp: 98.1 F (36.7 C) 97.4 F (36.3 C) 97.5 F (36.4 C) 98.2 F (36.8 C)  TempSrc: Oral Axillary Oral Oral  Resp: 18 18 18 18   Height:      Weight:      SpO2: 100% 99% 100% 99%    General: NAD Cardiovascular: RRR Respiratory: CTAB  Discharge Instructions  Discharge Orders   Future Orders Complete By Expires     Diet general  As directed     Discharge instructions  As directed     Comments:      No driving, do not operate heavy machinery, no activities at height, no water activities until released by outpatient physician. Followup with PCP one to 2 weeks post discharge. Followup with neurologist in 2 weeks.    Increase activity slowly  As directed         Medication List    TAKE these medications       folic acid 1 MG tablet  Commonly known as:  FOLVITE  Take 1 tablet (1 mg total) by mouth daily.     levETIRAcetam 750 MG tablet  Commonly known as:  KEPPRA  Take 1 tablet (750 mg total) by mouth 2 (two) times daily.     multivitamin with minerals Tabs  Take 1 tablet by mouth daily.     thiamine 100 MG tablet  Take 1 tablet (100 mg total) by mouth daily.           Follow-up Information   Schedule an appointment as soon as possible for a visit in 1 week to follow up. (f/u with PCP in 1-2 weeks)       Follow up with [facility]. Schedule an appointment as soon as possible for a visit in 2 weeks.   Contact information:   [address] [place] [address] [phone]        The results of significant diagnostics from this hospitalization (including imaging, microbiology, ancillary and laboratory) are listed below for reference.    Significant Diagnostic Studies: Ct Head Wo Contrast  01/30/2013  *RADIOLOGY REPORT*  Clinical Data: Recent seizure and falls.  CT HEAD WITHOUT CONTRAST  Technique:  Contiguous axial images were obtained from the base of the skull through the vertex without contrast.  Comparison: MRI 01/28/2013.  CT head 01/27/2013.  Findings: Small bilateral extra-axial fluid collections consistent with subdural hematoma/hygromas no greater than 3 mm are similar to the prior MR appearance of 01/28/2013.  No significant compression of the underlying brain.  No new fluid collection. No significant increase in size.  Mild atrophy and chronic microvascular ischemic change redemonstrated.  Calvarium intact.  No acute sinus or mastoid disease.  IMPRESSION: Small bilateral extra-axial fluid collections are stable.  No significant compression of the underlying brain.   Original Report Authenticated By: Davonna Belling, M.D.  Ct Head Wo Contrast  01/27/2013  *RADIOLOGY REPORT*  Clinical Data: Seizures.  CT HEAD WITHOUT CONTRAST  Technique:  Contiguous axial images were obtained from the base of the skull through the vertex without contrast.  Comparison: CT head without contrast 01/13/2011.  Findings: No acute cortical infarct, hemorrhage, mass lesion is present.  The ventricles are of normal size.  No significant extra- axial fluid collection is present.  The paranasal sinuses and mastoid air cells are clear.  The osseous skull is intact.  No significant extracranial soft tissue injury is evident.  IMPRESSION:  1.  Negative CT of head.   Original Report Authenticated By: Marin Roberts, M.D.    Mr Brain Wo Contrast  01/28/2013  *RADIOLOGY REPORT*  Clinical Data: Seizures yesterday fell and hit head.  History of seizure disorder, chronic alcohol use, hypertension and  hyperlipidemia.  MRI HEAD WITHOUT CONTRAST  Technique:  Multiplanar, multiecho pulse sequences of the brain and surrounding structures were obtained according to standard protocol without intravenous contrast.  Comparison: 01/27/2013 CT.  Findings: No acute infarct.  Shallow bilateral subdural collections greater on the right most consistent with subdural hematomas with maximal thickness anterior right frontal lobe of 5.2 mm.  These subdural collections appear slightly bright on diffusion sequence.  Subdural empyema could therefore not be completely excluded but does not appear to be of clinical concern.  Age of the subdural hematomas is indeterminate but may be related to patient's recent fall.  Mild small vessel disease type changes.  Global atrophy without hydrocephalus.  No intracranial mass lesion seen separate from above described findings.  Major intracranial vascular structures are patent.  Paranasal sinus mucosal thickening most notable left maxillary sinus.  No evidence of mesial temporal sclerosis.  IMPRESSION: Shallow bilateral subdural collections greater on the right most consistent with subdural hematomas with maximal thickness anterior right frontal lobe of 5.2 mm.  Atrophy.  Please see above.  This has been made a PRA call report utilizing dashboard call feature.   Original Report Authenticated By: Lacy Duverney, M.D.     Microbiology: No results found for this or any previous visit (from the past 240 hour(s)).   Labs: Basic Metabolic Panel:  Recent Labs Lab 01/27/13 2115 01/29/13 0826 01/30/13 0530  NA 135 137 136  K 3.8 3.1* 3.2*  CL 99 103 105  CO2 23 24 24   GLUCOSE 96 100* 91  BUN 5* 5* 3*  CREATININE 0.57 0.55 0.48*  CALCIUM 9.0 8.8 8.7  MG  --  1.8 2.0   Liver Function Tests:  Recent Labs Lab 01/27/13 2115  AST 75*  ALT 27  ALKPHOS 77  BILITOT 0.5  PROT 7.5  ALBUMIN 3.4*   No results found for this basename: LIPASE, AMYLASE,  in the last 168 hours No results  found for this basename: AMMONIA,  in the last 168 hours CBC:  Recent Labs Lab 01/27/13 2115 01/29/13 0826  WBC 5.4 4.4  HGB 13.1 12.3  HCT 37.4 35.1*  MCV 101.6* 102.6*  PLT 132* 111*   Cardiac Enzymes: No results found for this basename: CKTOTAL, CKMB, CKMBINDEX, TROPONINI,  in the last 168 hours BNP: BNP (last 3 results) No results found for this basename: PROBNP,  in the last 8760 hours CBG:  Recent Labs Lab 01/27/13 1921  GLUCAP 130*       Signed:  THOMPSON,DANIEL  [facility] 01/30/2013, 11:00 AM

## 2013-01-30 NOTE — Progress Notes (Signed)
Clinical Social Work  CSW met with patient at bedside. Per RN, patient alert and oriented this morning. Patient remembered CSW from previous visit and was interested in speaking with CSW.  Patient reports she is feeling better and is ready to dc. Patient states that she lives with husband and he will transport her home and to any follow up appointments. Patient reports that her husband is worried about her alcohol intake but he does not drink often. Patient reports that she plans to reduce or eliminate her drinking. Patient feels she is mostly a social drinker but reports that she can "over do it" when out with friends. CSW spoke with patient regarding the dangers of alcohol and if she was agreeable to stay sober. CSW offered community resources again to patient but patient politely declined. Patient does not believe she has a problem with her alcohol consumption and believes that husband can support her desire to remain sober.  CSW is signing off but available if needed.  [place], [place] [phone]

## 2013-01-30 NOTE — Progress Notes (Signed)
Pt ambulating in halls alone and is steady on her feet, noted HHPT/OT services ordered. Discussed this with Dr Janee Morn, asked me to only set up [facility] and HHSW  Services. Met with pt to discuss this, she stated she does not feel she need the HHSW services but does want [facility] services. Referral made to [facility] since she is self pay.

## 2013-01-30 NOTE — Progress Notes (Signed)
OT Cancellation Note  Patient Details Name: Azha Constantin MRN: [id number] DOB: November 01, 1960   Cancelled Treatment:    Reason Eval/Treat Not Completed: Other (comment) (Spoke with pt who stated she had not more OT needs.) Pt stated she had 24/7 A at home and had not concerns regarding ADL activity at home.  Family and PT present for conversation. Pt sta  Alba Cory 01/30/2013, 11:00 AM

## 2013-01-30 NOTE — Procedures (Signed)
EEG NUMBER:  HISTORY:  A 52 year old female with a history of alcohol abuse and seizures admitted with breakthrough seizures.  MEDICATIONS:  Ativan, NicoDerm, Folvite, and Zofran.  CONDITIONS OF RECORDING:  This is a 16-channel EEG carried out with the patient in the awake state.  DESCRIPTION:  The waking background activity consists of a low-voltage, symmetrical, fairly well-organized 11 hertz alpha activity seen from the parieto-occipital and posterotemporal regions.  Beta activity is diffusely distributed and at times obscures the background rhythm.  When the background can be observed there is noted a mixture of theta and alpha rhythms in the central and temporal regions.  The patient does not drowse or sleep.  Hyperventilation was performed and elicited a mild buildup, but failed to elicit any abnormalities.  Intermittent photic stimulation was not performed.  IMPRESSION:  This is a normal EEG.  The predominance of beta activity is likely related to medications.  No epileptiform activity was noted.          ______________________________ Thana Farr, MD    [id number] D:  01/30/2013 07:09:59  T:  01/30/2013 07:25:43  Job #:  [id number]

## 2013-01-30 NOTE — Progress Notes (Signed)
Patient discharge home with family, alert and oriented, discharge instructions given, patient verbalize understanding of discharge orders given, MyChart refuse at this time, stated," we do not have a computer at home, but my husband will get to it later,' instructions given for MyChart again patient verbalize understanding, patient in stable condition at this time

## 2013-02-14 ENCOUNTER — Encounter (HOSPITAL_COMMUNITY): Payer: Self-pay

## 2013-02-14 ENCOUNTER — Emergency Department (INDEPENDENT_AMBULATORY_CARE_PROVIDER_SITE_OTHER)
Admission: EM | Admit: 2013-02-14 | Discharge: 2013-02-14 | Disposition: A | Discharge status: Home Health | Payer: No Typology Code available for payment source | Source: Home / Self Care | Attending: Family Medicine | Admitting: Family Medicine

## 2013-02-14 DIAGNOSIS — E7800 Pure hypercholesterolemia, unspecified: Secondary | ICD-10-CM

## 2013-02-14 DIAGNOSIS — R945 Abnormal results of liver function studies: Secondary | ICD-10-CM

## 2013-02-14 DIAGNOSIS — S065X9A Traumatic subdural hemorrhage with loss of consciousness of unspecified duration, initial encounter: Secondary | ICD-10-CM

## 2013-02-14 DIAGNOSIS — E876 Hypokalemia: Secondary | ICD-10-CM

## 2013-02-14 DIAGNOSIS — I10 Essential (primary) hypertension: Secondary | ICD-10-CM

## 2013-02-14 DIAGNOSIS — K029 Dental caries, unspecified: Secondary | ICD-10-CM

## 2013-02-14 DIAGNOSIS — I6200 Nontraumatic subdural hemorrhage, unspecified: Secondary | ICD-10-CM

## 2013-02-14 DIAGNOSIS — R569 Unspecified convulsions: Secondary | ICD-10-CM

## 2013-02-14 DIAGNOSIS — F17200 Nicotine dependence, unspecified, uncomplicated: Secondary | ICD-10-CM

## 2013-02-14 DIAGNOSIS — S065XAA Traumatic subdural hemorrhage with loss of consciousness status unknown, initial encounter: Secondary | ICD-10-CM

## 2013-02-14 NOTE — ED Notes (Signed)
Patient here to establish care History of seizures

## 2013-02-14 NOTE — ED Provider Notes (Signed)
History     CSN: [id number]  Arrival date & time 02/14/13  1012   First MD Initiated Contact with Patient 02/14/13 1020     Chief Complaint  Patient presents with  . Establish Care    (Consider location/radiation/quality/duration/timing/severity/associated sxs/prior treatment) HPI Pt is presenting to follow up for from hospital for seizure disorder. Pt says that she is taking Keppra and no further seizure activity.  Pt otherwise has been well.  She has about a 1 month supply of her meds from hospital.  Pt still smoking.     Past Medical History  Diagnosis Date  . PONV (postoperative nausea and vomiting)   . Seizures     ABOU T  1  YR  AGO.......NO PROBLEMS SINCE  . Fibroids   . Subdural hematoma 01/29/2013    Per MRI 01/2013    Past Surgical History  Procedure Laterality Date  . Orif ulnar fracture  09/03/2011    Procedure: OPEN REDUCTION INTERNAL FIXATION (ORIF) ULNAR FRACTURE;  Surgeon: Cammy Copa;  Location: [facility] OR;  Service: Orthopedics;  Laterality: Right;  . Orif ulnar fracture  12/14/2011    Procedure: OPEN REDUCTION INTERNAL FIXATION (ORIF) ULNAR FRACTURE;  Surgeon: Cammy Copa, MD;  Location: [facility] OR;  Service: Orthopedics;  Laterality: Right;  Right ulnar shaft open reduction, internal fixation with right iliac crest bone graft    No family history on file.  History  Substance Use Topics  . Smoking status: Current Every Day Smoker -- 0.25 packs/day for 15 years    Types: Cigarettes  . Smokeless tobacco: Not on file  . Alcohol Use: 3.6 oz/week    6 Cans of beer per week    OB History   Grav Para Term Preterm Abortions TAB SAB Ect Mult Living                 Review of Systems Constitutional: Negative.  HENT: Negative.  Respiratory: Negative.  Cardiovascular: Negative.  Gastrointestinal: Negative.  Endocrine: Negative.  Genitourinary: Negative.  Musculoskeletal: Negative.  Skin: Negative.  Allergic/Immunologic: Negative.  Neurological:  seizure disorder  Hematological: Negative.  Psychiatric/Behavioral: Negative.  All other systems reviewed and are negative  Allergies  Oxycodone hcl and Acetaminophen  Home Medications   Current Outpatient Rx  Name  Route  Sig  Dispense  Refill  . folic acid (FOLVITE) 1 MG tablet   Oral   Take 1 tablet (1 mg total) by mouth daily.         Marland Kitchen levETIRAcetam (KEPPRA) 750 MG tablet   Oral   Take 1 tablet (750 mg total) by mouth 2 (two) times daily.   62 tablet   0   . Multiple Vitamin (MULTIVITAMIN WITH MINERALS) TABS   Oral   Take 1 tablet by mouth daily.         Marland Kitchen thiamine 100 MG tablet   Oral   Take 1 tablet (100 mg total) by mouth daily.           BP 154/102  Pulse 93  Temp(Src) 97.5 F (36.4 C) (Oral)  Resp 18  SpO2 100%  Physical Exam Nursing note and vitals reviewed.  Constitutional: She is oriented to person, place, and time. She appears well-developed and well-nourished. No distress.  HENT:  Head: Normocephalic and atraumatic.  Eyes: Conjunctivae and EOM are normal. Pupils are equal, round, and reactive to light.  Neck: Normal range of motion. Neck supple. No JVD present. No tracheal deviation present. No thyromegaly present.  Cardiovascular: Normal rate, regular rhythm and normal heart sounds.  Pulmonary/Chest: Effort normal and breath sounds normal. No respiratory distress. She has no wheezes.  Abdominal: Soft. Bowel sounds are normal.  Musculoskeletal: Normal range of motion. She exhibits no edema and no tenderness.  Lymphadenopathy:  She has no cervical adenopathy.  Neurological: She is alert and oriented to person, place, and time. She has normal reflexes.  Skin: Skin is warm and dry.  Psychiatric: She has a normal mood and affect. Her behavior is normal. Judgment and thought content normal.    ED Course  Procedures (including critical care time)  Labs Reviewed - No data to display No results found.   No diagnosis found.    MDM   IMPRESSION  Epilepsy  Subdural Hematomas   Tobacco Usage   Elevated blood pressure   RECOMMENDATIONS / PLAN The patient was counseled on the dangers of tobacco use, and was advised to quit.  Reviewed strategies to maximize success, including removing cigarettes and smoking materials from environment and stress management. Pt to follow up with neurology next week for follow up appointment Encouraged pt to go to [facility] program for assistance with getting Keppra, she has about a 1 month supply Elevated blood pressure - reassess BP on follow up .  Reviewed hospital records today.  Reviewed discharge summaries.   FOLLOW UP 1 month   The patient was given clear instructions to go to ER or return to medical center if symptoms don't improve, worsen or new problems develop.  The patient verbalized understanding.  The patient was told to call to get lab results if they haven't heard anything in the next week.            Cleora Fleet, MD 02/14/13 1037

## 2013-03-10 ENCOUNTER — Encounter: Payer: Self-pay | Admitting: Neurology

## 2013-03-10 ENCOUNTER — Ambulatory Visit (INDEPENDENT_AMBULATORY_CARE_PROVIDER_SITE_OTHER): Payer: No Typology Code available for payment source | Admitting: Neurology

## 2013-03-10 VITALS — BP 136/84 | HR 80 | Temp 98.9°F | Resp 12 | Ht 68.0 in | Wt 147.0 lb

## 2013-03-10 DIAGNOSIS — G40309 Generalized idiopathic epilepsy and epileptic syndromes, not intractable, without status epilepticus: Secondary | ICD-10-CM

## 2013-03-10 MED ORDER — LEVETIRACETAM ER 750 MG PO TB24: 750.0000 mg | ORAL_TABLET | Freq: Two times a day (BID) | ORAL | Status: DC

## 2013-03-10 NOTE — Patient Instructions (Addendum)
Please call our office if you have any seizure activity.  Continue your Keppra as directed.  It is very important to take this medication every day.  Follow up in our office in 3 months.

## 2013-03-10 NOTE — Progress Notes (Signed)
Cynthia Welch is a 52 year old woman with a history of falls going back at least 2 2011. There is a CT scan from 2011 which mentions a fall and a head injury is the reason for the study.  Her details or history are somewhat limited. At first she states that she's been having falls just for the last year, but when questioned about 2011 CAT scan she figures that was probably due to a seizure event also.  She usually does not have a warning but when she is witnessed, she is said to him at the mouth and drug all over and passed out.  She has some letter "little seizures where family member may put her in the bed and it passes him some better more full-blown grand mal seizures.  She is estimating that she's had about 8 in the past year.  She did have a warning on one occasion when she was working at [employer].  Medication she fell and broke a chair.  With the last episode she bit her lip and assist now healing from that 6 weeks later.  A couple times she did have urinary incontinence but the last several time she did not.  She was placed on Keppra 750 b.i.d. 6 weeks ago she's had no further seizures and she is tolerating the medication well.  As far as I can tell she was not taking antiseizure medications previously.  She is not driving at this point and I advised her not to drive.  Review of systems is positive for tremor which runs in the family and occasional headaches and some nervousness and her blood pressure in the up-and-down but she doesn't need to take medication for her blood pressure.  She says she drinks beer but she denies being a heavy seizure. The ER record does make reference to her being in chronic alcohol user.  Past history also includes small subdural hygromas in the frontal regions presumably from all the falls, and these were stable as of her last CT and MRI studies.  Past Medical History  Diagnosis Date  . PONV (postoperative nausea and vomiting)   . Seizures     ABOU T  1  YR  AGO.......NO  PROBLEMS SINCE  . Fibroids   . Subdural hematoma 01/29/2013    Per MRI 01/2013    Current Outpatient Prescriptions on File Prior to Visit  Medication Sig Dispense Refill  . folic acid (FOLVITE) 1 MG tablet Take 1 tablet (1 mg total) by mouth daily.      . Multiple Vitamin (MULTIVITAMIN WITH MINERALS) TABS Take 1 tablet by mouth daily.      Marland Kitchen thiamine 100 MG tablet Take 1 tablet (100 mg total) by mouth daily.       No current facility-administered medications on file prior to visit.   Oxycodone hcl and Acetaminophen  History   Social History  . Marital Status: Single    Spouse Name: N/A    Number of Children: N/A  . Years of Education: N/A   Occupational History  . Not on file.   Social History Main Topics  . Smoking status: Current Every Day Smoker -- 0.25 packs/day for 15 years    Types: Cigarettes  . Smokeless tobacco: Never Used  . Alcohol Use: 3.6 oz/week    6 Cans of beer per week     Comment: one or two beers a day  . Drug Use: No  . Sexually Active: Yes    Birth Control/ Protection: None  Other Topics Concern  . Not on file   Social History Narrative  . No narrative on file   No family history on file.   BP 136/84  Pulse 80  Temp(Src) 98.9 F (37.2 C)  Resp 12  Ht 5\' 8"  (1.727 m)  Wt 147 lb (66.679 kg)  BMI 22.36 kg/m2   Alert and oriented x 3.  Memory function appears to be intact.  Concentration and attention are normal for educational level and background.  Speech is fluent and without significant word finding difficulty.  Is aware of current events.  No carotid bruits detected.  Cranial nerve II through XII are within normal limits.  This includes normal optic discs and acuity, EOMI, PERLA, facial movement and sensation intact, hearing grossly intact, gag intact,Uvula raises symmetrically and tongue protrudes evenly. Motor strength is 5 over 5 throughout all limbs.  No atrophy or abnormal tone .  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 Reflexes are 2+ and symmetric in the upper  Extremities, trace at the knees and absent at the ankles. Sensory exam is intact. Coordination is intact for fine movements and rapid alternating movements in all limbs Gait and station are normal.   Impression: 1. The description sounds consistent with grand mal or generalized tonic-clonic seizures occurring probably since at least 2011 but the exact date of onset is uncertain based on the limitations of the historian.  Previously untreated now on Keppra 750 b.i.d. Which he is tolerating but no seizures in the past 6 weeks.  She also has a history of subdural hygromas presumably from the multiple falls and head injuries.  It is along clear as to how significant alcohol history is, but this could also be a factor in terms of the risk for seizure disorder.  Plan: We will try the 24-hour form of Keppra 750, but she will still take it twice a day which is the routine that she is in now. She will avoid driving She will call us if she has any further episodes certainly can increase the dose if necessary She would be best served to avoid alcohol Return here in 3 months.

## 2013-08-25 ENCOUNTER — Telehealth: Payer: Self-pay | Admitting: Neurology

## 2013-08-25 NOTE — Telephone Encounter (Signed)
Rec'd from Disability Determination Service forward 11 pages to Dr.Soo

## 2013-09-22 ENCOUNTER — Telehealth: Payer: Self-pay | Admitting: Neurology

## 2013-09-22 NOTE — Telephone Encounter (Signed)
Rec'd from Disability Determination Service forward 5 pages to Dr.Soo

## 2014-08-21 ENCOUNTER — Telehealth: Payer: Self-pay | Admitting: Neurology

## 2014-08-21 NOTE — Telephone Encounter (Signed)
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 Request sent to HIM at [facility] via interoffice mail for processing / Sherri S.

## 2014-09-02 ENCOUNTER — Telehealth: Payer: Self-pay | Admitting: Neurology

## 2014-09-02 NOTE — Telephone Encounter (Signed)
recv'd records request by mail from the [facility]. Request forwarded by inter-office mailed to HIM at [facility] for processing / Sherri S.

## 2014-10-15 ENCOUNTER — Telehealth: Payer: Self-pay | Admitting: Neurology

## 2014-10-15 NOTE — Telephone Encounter (Signed)
10/12/14-records request recv'd by mail from [facility]. Request sent to HIM @[facility] via inter office mail for processing / Sherri S.

## 2015-05-18 ENCOUNTER — Encounter (HOSPITAL_COMMUNITY): Payer: Self-pay | Admitting: Emergency Medicine

## 2015-05-18 DIAGNOSIS — Z79899 Other long term (current) drug therapy: Secondary | ICD-10-CM | POA: Insufficient documentation

## 2015-05-18 DIAGNOSIS — R200 Anesthesia of skin: Secondary | ICD-10-CM | POA: Insufficient documentation

## 2015-05-18 DIAGNOSIS — R1030 Lower abdominal pain, unspecified: Secondary | ICD-10-CM | POA: Insufficient documentation

## 2015-05-18 DIAGNOSIS — O99331 Smoking (tobacco) complicating pregnancy, first trimester: Secondary | ICD-10-CM | POA: Insufficient documentation

## 2015-05-18 DIAGNOSIS — Z3A01 Less than 8 weeks gestation of pregnancy: Secondary | ICD-10-CM | POA: Insufficient documentation

## 2015-05-18 DIAGNOSIS — G40909 Epilepsy, unspecified, not intractable, without status epilepticus: Secondary | ICD-10-CM | POA: Insufficient documentation

## 2015-05-18 DIAGNOSIS — F17210 Nicotine dependence, cigarettes, uncomplicated: Secondary | ICD-10-CM | POA: Insufficient documentation

## 2015-05-18 DIAGNOSIS — O9989 Other specified diseases and conditions complicating pregnancy, childbirth and the puerperium: Secondary | ICD-10-CM | POA: Insufficient documentation

## 2015-05-18 DIAGNOSIS — O99351 Diseases of the nervous system complicating pregnancy, first trimester: Secondary | ICD-10-CM | POA: Insufficient documentation

## 2015-05-18 DIAGNOSIS — Z86018 Personal history of other benign neoplasm: Secondary | ICD-10-CM | POA: Insufficient documentation

## 2015-05-18 DIAGNOSIS — R1013 Epigastric pain: Secondary | ICD-10-CM | POA: Insufficient documentation

## 2015-05-18 LAB — URINALYSIS, ROUTINE W REFLEX MICROSCOPIC
Bilirubin Urine: NEGATIVE
Glucose, UA: NEGATIVE mg/dL
Hgb urine dipstick: NEGATIVE
KETONES UR: NEGATIVE mg/dL
Leukocytes, UA: NEGATIVE
Nitrite: NEGATIVE
Protein, ur: NEGATIVE mg/dL
Specific Gravity, Urine: 1.001 — ABNORMAL LOW (ref 1.005–1.030)
Urobilinogen, UA: 0.2 mg/dL (ref 0.0–1.0)
pH: 5 (ref 5.0–8.0)

## 2015-05-18 LAB — COMPREHENSIVE METABOLIC PANEL
ALBUMIN: 3.5 g/dL (ref 3.5–5.0)
ALK PHOS: 69 U/L (ref 38–126)
ALT: 11 U/L — ABNORMAL LOW (ref 14–54)
AST: 18 U/L (ref 15–41)
Anion gap: 11 (ref 5–15)
CALCIUM: 8.9 mg/dL (ref 8.9–10.3)
CHLORIDE: 108 mmol/L (ref 101–111)
CO2: 20 mmol/L — ABNORMAL LOW (ref 22–32)
CREATININE: 0.84 mg/dL (ref 0.44–1.00)
GFR calc non Af Amer: >60 mL/min (ref >60)
Glucose, Bld: 96 mg/dL (ref 65–99)
POTASSIUM: 3.5 mmol/L (ref 3.5–5.1)
SODIUM: 139 mmol/L (ref 135–145)
Total Bilirubin: 0.2 mg/dL — ABNORMAL LOW (ref 0.3–1.2)
Total Protein: 7.4 g/dL (ref 6.5–8.1)

## 2015-05-18 LAB — CBC
HCT: 39.7 % (ref 36.0–46.0)
Hemoglobin: 13.7 g/dL (ref 12.0–15.0)
MCH: 33.8 pg (ref 26.0–34.0)
MCHC: 34.5 g/dL (ref 30.0–36.0)
MCV: 98 fL (ref 78.0–100.0)
Platelets: 255 10*3/uL (ref 150–400)
RBC: 4.05 MIL/uL (ref 3.87–5.11)
RDW: 13.3 % (ref 11.5–15.5)
WBC: 7 10*3/uL (ref 4.0–10.5)

## 2015-05-18 LAB — LIPASE, BLOOD: LIPASE: 34 U/L (ref 22–51)

## 2015-05-18 NOTE — ED Notes (Signed)
Pt. reports low abdominal pain with emesis onset 2 days ago , denies diarrhea , no fever or chills, states pain similar to her fibroids .

## 2015-05-19 ENCOUNTER — Emergency Department (HOSPITAL_COMMUNITY)
Admission: EM | Admit: 2015-05-19 | Discharge: 2015-05-19 | Disposition: A | Discharge status: Home / Self Care | Payer: No Typology Code available for payment source | Attending: Emergency Medicine | Admitting: Emergency Medicine

## 2015-05-19 DIAGNOSIS — R109 Unspecified abdominal pain: Secondary | ICD-10-CM

## 2015-05-19 LAB — HCG, QUANTITATIVE, PREGNANCY: hCG, Beta Chain, Quant, S: <1 m[IU]/mL (ref <5)

## 2015-05-19 LAB — I-STAT BETA HCG BLOOD, ED (MC, WL, AP ONLY): HCG, QUANTITATIVE: 6.5 m[IU]/mL — AB (ref <5)

## 2015-05-19 MED ORDER — HYDROCODONE-ACETAMINOPHEN 5-325 MG PO TABS
1.0000 | ORAL_TABLET | Freq: Once | ORAL | Status: DC
  Filled 2015-05-19: qty 1

## 2015-05-19 MED ORDER — OXYCODONE HCL 5 MG PO TABS
5.0000 mg | ORAL_TABLET | Freq: Once | ORAL | Status: AC
  Administered 2015-05-19: 5 mg via ORAL
  Filled 2015-05-19: qty 1

## 2015-05-19 MED ORDER — IBUPROFEN 800 MG PO TABS
800.0000 mg | ORAL_TABLET | Freq: Once | ORAL | Status: AC
  Administered 2015-05-19: 800 mg via ORAL
  Filled 2015-05-19: qty 1

## 2015-05-19 NOTE — ED Provider Notes (Signed)
CSN: [id number]     Arrival date & time 05/18/15  2012 History  This chart was scribed for Ripley Fraise, MD by Julien Nordmann, ED Scribe. This patient was seen in room A09C/A09C and the patient's care was started at 1:10 AM.     Chief Complaint  Patient presents with  . Abdominal Pain      Patient is a 54 y.o. female presenting with abdominal pain. The history is provided by the patient. No language interpreter was used.  Abdominal Pain Pain location:  Epigastric Pain quality: tearing   Pain radiates to:  Back Pain severity:  Moderate Onset quality:  Gradual Timing:  Intermittent Progression:  Worsening Chronicity:  Recurrent Context: not eating and not previous surgeries   Relieved by:  Nothing Worsened by:  Nothing tried Ineffective treatments:  None tried Associated symptoms: fever   Associated symptoms: no dysuria and no vaginal bleeding    HPI Comments: Cynthia Welch is a 54 y.o. female who has a hx of fibroids presents to the Emergency Department complaining of intermittent, gradual worsening lower abdominal pain onset this evening. She rates her current pain a 7/10 with her highest being 10/10 PTA. She feels like she has back pain that radiates to her abdomen. Pt states the pain feels like a "rip" and her stomach feels swollen. She has been eating and drinking normally. Pt has not taken any medication to alleviate the pain. She reports getting this pain about once or twice a week that has been going on for months. Pt states the pain is similar to the fibroids she has and is unknown if one ruptured or not. She has not had pain this severe which brought her into the ED. Pt notes not having a period for 3 months but states it came back after her pain. She denies dysuria, vaginal bleeding, and diarrhea today  Past Medical History  Diagnosis Date  . PONV (postoperative nausea and vomiting)   . Seizures     ABOU T  1  YR  AGO.......NO PROBLEMS SINCE  . Fibroids   . Subdural  hematoma 01/29/2013    Per MRI 01/2013   Past Surgical History  Procedure Laterality Date  . Orif ulnar fracture  09/03/2011    Procedure: OPEN REDUCTION INTERNAL FIXATION (ORIF) ULNAR FRACTURE;  Surgeon: Meredith Pel;  Location: [place];  Service: Orthopedics;  Laterality: Right;  . Orif ulnar fracture  12/14/2011    Procedure: OPEN REDUCTION INTERNAL FIXATION (ORIF) ULNAR FRACTURE;  Surgeon: Meredith Pel, MD;  Location: [place];  Service: Orthopedics;  Laterality: Right;  Right ulnar shaft open reduction, internal fixation with right iliac crest bone graft   No family history on file. History  Substance Use Topics  . Smoking status: Current Every Day Smoker -- 0.25 packs/day for 15 years    Types: Cigarettes  . Smokeless tobacco: Never Used  . Alcohol Use: Yes   OB History    No data available     Review of Systems  Constitutional: Positive for fever.  Gastrointestinal: Positive for abdominal pain.  Genitourinary: Negative for dysuria and vaginal bleeding.  Neurological: Positive for numbness.  All other systems reviewed and are negative.     Allergies  Oxycodone hcl and Acetaminophen  Home Medications   Prior to Admission medications   Medication Sig Start Date End Date Taking? Authorizing Provider  folic acid (FOLVITE) 1 MG tablet Take 1 tablet (1 mg total) by mouth daily. 01/30/13   Eugenie Filler,  MD  Levetiracetam 750 MG TB24 Take 1 tablet (750 mg total) by mouth 2 (two) times daily. 03/10/13 03/10/14  Lenis Dickinson, MD  Multiple Vitamin (MULTIVITAMIN WITH MINERALS) TABS Take 1 tablet by mouth daily. 01/30/13   Eugenie Filler, MD  thiamine 100 MG tablet Take 1 tablet (100 mg total) by mouth daily. 01/30/13   Eugenie Filler, MD   Triage vitals: BP 161/100 mmHg  Pulse 98  Temp(Src) 98.9 F (37.2 C) (Oral)  Resp 20  Ht 5\' 9"  (1.753 m)  Wt 189 lb 4 oz (85.843 kg)  BMI 27.93 kg/m2  SpO2 99% Physical Exam CONSTITUTIONAL: Well developed/well  nourished HEAD: Normocephalic/atraumatic EYES: EOMI/PERRL ENMT: Mucous membranes moist NECK: supple no meningeal signs SPINE/BACK:entire spine nontender CV: S1/S2 noted, no murmurs/rubs/gallops noted LUNGS: Lungs are clear to auscultation bilaterally, no apparent distress ABDOMEN: soft, mild lower abdominal firmness noted likely enlarged uterus, no rebound or guarding, bowel sounds noted throughout abdomen GU:no cva tenderness NEURO: Pt is awake/alert/appropriate, moves all extremitiesx4.  No facial droop.   EXTREMITIES: pulses normal/equal, full ROM SKIN: warm, color normal PSYCH: no abnormalities of mood noted, alert and oriented to situation  ED Course  Procedures  DIAGNOSTIC STUDIES: Oxygen Saturation is 99% on RA, normal by my interpretation.  COORDINATION OF CARE:  1:16 AM Discussed treatment plan which includes pain medication, lab work with pt at bedside and pt agreed to plan.  Pt admits to this pain frequently It is located over lower abd, suspect she does have fibroids However this appears to be a chronic process She is well appearing, no distress and requests to be discharged Advised need for GYN followup   Labs Review Labs Reviewed  COMPREHENSIVE METABOLIC PANEL - Abnormal; Notable for the following:    CO2 20 (*)    BUN <5 (*)    ALT 11 (*)    Total Bilirubin 0.2 (*)    All other components within normal limits  URINALYSIS, ROUTINE W REFLEX MICROSCOPIC (NOT AT [facility]) - Abnormal; Notable for the following:    Specific Gravity, Urine 1.001 (*)    All other components within normal limits  I-STAT BETA HCG BLOOD, ED ([facility], [facility], [facility] ONLY) - Abnormal; Notable for the following:    I-stat hCG, quantitative 6.5 (*)    All other components within normal limits  LIPASE, BLOOD  CBC  HCG, QUANTITATIVE, PREGNANCY     MDM   Final diagnoses:  Abdominal pain, unspecified abdominal location    Nursing notes including past medical history and social history reviewed and  considered in documentation Labs/vital reviewed myself and considered during evaluation   I personally performed the services described in this documentation, which was scribed in my presence. The recorded information has been reviewed and is accurate.     Ripley Fraise, MD 05/19/15 [phone]

## 2015-08-05 ENCOUNTER — Ambulatory Visit (INDEPENDENT_AMBULATORY_CARE_PROVIDER_SITE_OTHER): Payer: Self-pay | Admitting: Obstetrics and Gynecology

## 2015-08-05 ENCOUNTER — Encounter: Payer: Self-pay | Admitting: Obstetrics and Gynecology

## 2015-08-05 VITALS — BP 148/97 | HR 100 | Temp 98.6°F | Wt 175.0 lb

## 2015-08-05 DIAGNOSIS — Z1151 Encounter for screening for human papillomavirus (HPV): Secondary | ICD-10-CM

## 2015-08-05 DIAGNOSIS — R102 Pelvic and perineal pain: Secondary | ICD-10-CM

## 2015-08-05 DIAGNOSIS — D259 Leiomyoma of uterus, unspecified: Secondary | ICD-10-CM

## 2015-08-05 DIAGNOSIS — Z124 Encounter for screening for malignant neoplasm of cervix: Secondary | ICD-10-CM

## 2015-08-05 NOTE — Progress Notes (Signed)
[name], female   DOB: 1960-12-19, 54 y.o.   MRN: [id number]  Present today to discuss her fibroids.

## 2015-08-05 NOTE — Progress Notes (Signed)
   Subjective:    [name], female    DOB: 1960/12/24, 54 y.o.   MRN: [id number]  HPI 54 yo G0 presenting today for the evaluation of pelvic pain and fibroid uterus. Patient reports being diagnosed with fibroids for several years and has been medically managed. She states that surgical intervention was deferred as she was trying to preserve her fertility. Patient states that over the years her pelvic pain has gotten worse and is now radiating to her right side and lower back. She continues to experience a menstrual cycle and her pain is worst during her cycle. She describes her cycle as being irregular, often skipping months. She experiences hot flushes, night sweats and mood swings but these symptoms are not too bothersome. She denies urinary incontinence.  Past Medical History  Diagnosis Date  . PONV (postoperative nausea and vomiting)   . Seizures ([place])     ABOU T  1  YR  AGO.......NO PROBLEMS SINCE  . Fibroids   . Subdural hematoma ([place]) 01/29/2013    Per MRI 01/2013  . Hypertension    Past Surgical History  Procedure Laterality Date  . Orif ulnar fracture  09/03/2011    Procedure: OPEN REDUCTION INTERNAL FIXATION (ORIF) ULNAR FRACTURE;  Surgeon: Meredith Pel;  Location: [place];  Service: Orthopedics;  Laterality: Right;  . Orif ulnar fracture  12/14/2011    Procedure: OPEN REDUCTION INTERNAL FIXATION (ORIF) ULNAR FRACTURE;  Surgeon: Meredith Pel, MD;  Location: [place];  Service: Orthopedics;  Laterality: Right;  Right ulnar shaft open reduction, internal fixation with right iliac crest bone graft   No family history on file. Social History  Substance Use Topics  . Smoking status: Current Every Day Smoker -- 0.25 packs/day for 15 years    Types: Cigarettes  . Smokeless tobacco: Never Used  . Alcohol Use: Yes      Review of Systems See pertinent in HPI    Objective:   Physical Exam  GENERAL: Well-developed, well-nourished female in no acute  distress.  ABDOMEN: Soft, nontender, nondistended. Palpable fibroid uterus to the level of the umbilicus PELVIC: Normal external female genitalia. Vagina is pink and rugated.  Normal discharge. Normal appearing cervix. Uterus is 20-weeks in size. No adnexal mass or tenderness. EXTREMITIES: No cyanosis, clubbing, or edema, 2+ distal pulses.       Assessment & Plan:  54 yo with chronic pelvic pain and fibroid uterus - pap smear collected - Screening mammogram ordered - pelvic ultrasound ordered - Discussed definitive management with TAH/BSO. Risks and benefits were reviewed and explained including but not limited to risks of bleeding, infection and damage to adjacent organs. Patient verbalized understanding all questions were answered. Patient desires to preserve her ovaries in order to transition naturally into menopause. Discussed with patient that the ovaries will remain in place unless an abnormality is discovered. Patient agrees with the plan and will be scheduled for a TAH with bilateral salpingectomy with possible oophorectomy

## 2015-08-06 ENCOUNTER — Other Ambulatory Visit: Payer: Self-pay | Admitting: Internal Medicine

## 2015-08-06 DIAGNOSIS — Z1231 Encounter for screening mammogram for malignant neoplasm of breast: Secondary | ICD-10-CM

## 2015-08-09 ENCOUNTER — Ambulatory Visit
Admission: RE | Admit: 2015-08-09 | Discharge: 2015-08-09 | Disposition: A | Discharge status: Home / Self Care | Payer: No Typology Code available for payment source | Source: Ambulatory Visit | Attending: Internal Medicine | Admitting: Internal Medicine

## 2015-08-09 DIAGNOSIS — Z1231 Encounter for screening mammogram for malignant neoplasm of breast: Secondary | ICD-10-CM

## 2015-08-09 LAB — CYTOLOGY - PAP

## 2015-08-10 ENCOUNTER — Encounter (HOSPITAL_COMMUNITY): Payer: Self-pay | Admitting: *Deleted

## 2015-08-11 ENCOUNTER — Ambulatory Visit (HOSPITAL_COMMUNITY)
Admission: RE | Admit: 2015-08-11 | Discharge: 2015-08-11 | Disposition: A | Discharge status: Home / Self Care | Payer: No Typology Code available for payment source | Source: Ambulatory Visit | Attending: Obstetrics and Gynecology | Admitting: Obstetrics and Gynecology

## 2015-08-11 DIAGNOSIS — N939 Abnormal uterine and vaginal bleeding, unspecified: Secondary | ICD-10-CM | POA: Insufficient documentation

## 2015-08-11 DIAGNOSIS — D259 Leiomyoma of uterus, unspecified: Secondary | ICD-10-CM

## 2015-08-11 DIAGNOSIS — D252 Subserosal leiomyoma of uterus: Secondary | ICD-10-CM | POA: Insufficient documentation

## 2015-08-11 DIAGNOSIS — R102 Pelvic and perineal pain: Secondary | ICD-10-CM

## 2015-08-17 ENCOUNTER — Encounter: Payer: Self-pay | Admitting: *Deleted

## 2015-08-27 NOTE — Patient Instructions (Addendum)
Your procedure is scheduled on:  Tuesday, Nov. 15, 2016  Enter through the [employer] [facility] at:  12:45 AM  Pick up the phone at the desk and dial [phone].  Call this number if you have problems the morning of surgery: [phone].  Remember: Do NOT eat food: AFTER MIDNIGHT Monday, NOV. 14, 2016 Do NOT drink clear liquids after:  10:15 AM DAY OF SURGERY Take these medicines the morning of surgery with a SIP OF WATER: TRIAMTERENE  Do NOT wear jewelry (body piercing), metal hair clips/bobby pins, make-up, or nail polish. Do NOT wear lotions, powders, or perfumes.  You may wear deoderant. Do NOT shave for 48 hours prior to surgery. Do NOT bring valuables to the hospital. Contacts, dentures, or bridgework may not be worn into surgery. Leave suitcase in car.  After surgery it may be brought to your room.  For patients admitted to the hospital, checkout time is 11:00 AM the day of discharge.

## 2015-08-30 ENCOUNTER — Encounter (HOSPITAL_COMMUNITY)
Admission: RE | Admit: 2015-08-30 | Discharge: 2015-08-30 | Disposition: A | Discharge status: Home / Self Care | Payer: No Typology Code available for payment source | Source: Ambulatory Visit | Attending: Obstetrics and Gynecology | Admitting: Obstetrics and Gynecology

## 2015-08-30 ENCOUNTER — Encounter (HOSPITAL_COMMUNITY): Payer: Self-pay

## 2015-08-30 DIAGNOSIS — D259 Leiomyoma of uterus, unspecified: Secondary | ICD-10-CM | POA: Insufficient documentation

## 2015-08-30 DIAGNOSIS — R102 Pelvic and perineal pain: Secondary | ICD-10-CM | POA: Insufficient documentation

## 2015-08-30 DIAGNOSIS — Z01818 Encounter for other preprocedural examination: Secondary | ICD-10-CM | POA: Insufficient documentation

## 2015-08-30 HISTORY — DX: Headache: R51

## 2015-08-30 HISTORY — DX: Headache, unspecified: R51.9

## 2015-08-30 LAB — BASIC METABOLIC PANEL
ANION GAP: 9 (ref 5–15)
BUN: 7 mg/dL (ref 6–20)
CALCIUM: 9.2 mg/dL (ref 8.9–10.3)
CO2: 26 mmol/L (ref 22–32)
Chloride: 101 mmol/L (ref 101–111)
Creatinine, Ser: 0.78 mg/dL (ref 0.44–1.00)
Glucose, Bld: 97 mg/dL (ref 65–99)
POTASSIUM: 3.8 mmol/L (ref 3.5–5.1)
Sodium: 136 mmol/L (ref 135–145)

## 2015-08-30 LAB — CBC
HEMATOCRIT: 37.8 % (ref 36.0–46.0)
Hemoglobin: 12.9 g/dL (ref 12.0–15.0)
MCH: 32.3 pg (ref 26.0–34.0)
MCHC: 34.1 g/dL (ref 30.0–36.0)
MCV: 94.7 fL (ref 78.0–100.0)
PLATELETS: 290 10*3/uL (ref 150–400)
RBC: 3.99 MIL/uL (ref 3.87–5.11)
RDW: 13.7 % (ref 11.5–15.5)
WBC: 4.7 10*3/uL (ref 4.0–10.5)

## 2015-08-30 NOTE — Pre-Procedure Instructions (Signed)
Patient had abnormal EKG today at PAT appointment. Dr. Jillyn Hidden reviewed and no new orders were received.

## 2015-08-31 ENCOUNTER — Other Ambulatory Visit: Payer: Self-pay

## 2015-09-06 NOTE — H&P (Signed)
Cynthia Welch is an 54 y.o. female G0 here for definitive surgical treatment of her fibroid uterus. Patient has a history of chronic pelvic pain associated to fibroid uterus for several years. She continues to have monthly cycles with severe dysmenorrhea. Her pain is localized in her lower pelvis and radiates to her right flank area and lower back.   Pertinent Gynecological History: Menses: regular every month without intermenstrual spotting Bleeding: monthly Contraception: none DES exposure: denies Blood transfusions: none Sexually transmitted diseases: no past history Previous GYN Procedures: none  Last mammogram: normal Date: 08/09/2015 Last pap: normal Date: 08/05/2015 OB History: G0, P0   Menstrual History: No LMP recorded. Patient is not currently having periods (Reason: Perimenopausal).    Past Medical History  Diagnosis Date  . PONV (postoperative nausea and vomiting)   . Fibroids   . Subdural hematoma ([place]) 01/29/2013    Per MRI 01/2013  . Hypertension   . Seizures ([place])     5-6 months ago last seizure    . Headache     Past Surgical History  Procedure Laterality Date  . Orif ulnar fracture  09/03/2011    Procedure: OPEN REDUCTION INTERNAL FIXATION (ORIF) ULNAR FRACTURE;  Surgeon: Meredith Pel;  Location: [facility];  Service: Orthopedics;  Laterality: Right;  . Orif ulnar fracture  12/14/2011    Procedure: OPEN REDUCTION INTERNAL FIXATION (ORIF) ULNAR FRACTURE;  Surgeon: Meredith Pel, MD;  Location: [facility];  Service: Orthopedics;  Laterality: Right;  Right ulnar shaft open reduction, internal fixation with right iliac crest bone graft  . Tonsillectomy    . Diagnostic laparoscopy    . Dilation and curettage of uterus      History reviewed. No pertinent family history.  Social History:  reports that she has been smoking Cigarettes.  She has a 3.75 pack-year smoking history. She has never used smokeless tobacco. She reports that she drinks about 3.6 oz of  alcohol per week. She reports that she does not use illicit drugs.  Allergies:  Allergies  Allergen Reactions  . Oxycodone Hcl Itching  . Acetaminophen     REACTION: itching    Prescriptions prior to admission  Medication Sig Dispense Refill Last Dose  . Cholecalciferol (VITAMIN D3) 2000 UNITS TABS Take 1 tablet by mouth daily.   09/07/2015 at 1030  . traMADol (ULTRAM) 50 MG tablet TAKE 1 TAB BY MOUTH EVERY 6 HOURS AS NEEDED FOR 15 DAYS.  0 08/30/2015 at Unknown time  . triamterene-hydrochlorothiazide (MAXZIDE-25) 37.5-25 MG tablet Take 1 tablet by mouth daily.   09/07/2015 at 1030  . folic acid (FOLVITE) 1 MG tablet Take 1 tablet (1 mg total) by mouth daily. (Patient not taking: Reported on 08/05/2015)   Not Taking  . Levetiracetam 750 MG TB24 Take 1 tablet (750 mg total) by mouth 2 (two) times daily. (Patient not taking: Reported on 08/25/2015) 60 tablet 11     ROS See pertinent in HPI Blood pressure 150/106, pulse 111, temperature 98.7 F (37.1 C), temperature source Oral, resp. rate 16, SpO2 100 %. Physical Exam GENERAL: Well-developed, well-nourished female in no acute distress.  HEENT: Normocephalic, atraumatic. Sclerae anicteric.  NECK: Supple. Normal thyroid.  LUNGS: Clear to auscultation bilaterally.  HEART: Regular rate and rhythm. ABDOMEN: Soft, nontender, nondistended. No organomegaly. PELVIC: Deferred to OR EXTREMITIES: No cyanosis, clubbing, or edema, 2+ distal pulses.  No results found for this or any previous visit (from the past 24 hour(s)).  No results found. FINDINGS: Uterus  Measurements: 18.3  x 11.0 x 6.1 cm. 9.7 x 9.0 x 14.8 cm exophytic fundal fibroid. Additional 10.0 x 10.3 x 9.9 cm subserosal left fundal fibroid. 5.0 x 4.8 x 4.6 cm subserosal right fundal fibroid  Endometrium  Not discretely visualized.  Right ovary  Measurements: 2.8 x 2.2 x 1.7 cm. Normal appearance/no adnexal mass.  Left ovary  Not discretely visualized. No  adnexal mass is seen.  Other findings  No free fluid.  IMPRESSION: Enlarged uterus with multiple uterine fibroids, including a 14.8 cm exophytic fundal fibroid, poorly visualized.  Left ovary is not discretely visualized. No adnexal mass is seen.   Electronically Signed  By: Julian Hy M.D.  On: 08/11/2015 10:55  Assessment/Plan: 55 yo with fibroid uterus and chronic pelvic pain here for definitive treatment - Risks, benefits and alternatives were explained including but not limited to risks of bleeding, infection and damage to adjacent organs. Patient verbalized understanding and has agreed to a total abdominal hysterectomy with bilateral salpingectomy  Conrad Zajkowski 09/07/2015, 2:00 PM

## 2015-09-07 ENCOUNTER — Inpatient Hospital Stay (HOSPITAL_COMMUNITY)
Admission: RE | Admit: 2015-09-07 | Discharge: 2015-09-09 | DRG: 742 | Disposition: A | Discharge status: Home / Self Care | Payer: No Typology Code available for payment source | Source: Ambulatory Visit | Attending: Obstetrics and Gynecology | Admitting: Obstetrics and Gynecology

## 2015-09-07 ENCOUNTER — Inpatient Hospital Stay (HOSPITAL_COMMUNITY): Payer: Self-pay | Admitting: Anesthesiology

## 2015-09-07 ENCOUNTER — Inpatient Hospital Stay (HOSPITAL_COMMUNITY): Payer: No Typology Code available for payment source | Admitting: Anesthesiology

## 2015-09-07 ENCOUNTER — Encounter (HOSPITAL_COMMUNITY)
Admission: RE | Disposition: A | Discharge status: Home / Self Care | Payer: Self-pay | Source: Ambulatory Visit | Attending: Obstetrics and Gynecology

## 2015-09-07 ENCOUNTER — Encounter (HOSPITAL_COMMUNITY): Payer: Self-pay

## 2015-09-07 DIAGNOSIS — D259 Leiomyoma of uterus, unspecified: Secondary | ICD-10-CM

## 2015-09-07 DIAGNOSIS — N920 Excessive and frequent menstruation with regular cycle: Secondary | ICD-10-CM

## 2015-09-07 DIAGNOSIS — I00 Rheumatic fever without heart involvement: Secondary | ICD-10-CM

## 2015-09-07 DIAGNOSIS — I10 Essential (primary) hypertension: Secondary | ICD-10-CM | POA: Diagnosis present

## 2015-09-07 DIAGNOSIS — Z90710 Acquired absence of both cervix and uterus: Secondary | ICD-10-CM | POA: Diagnosis present

## 2015-09-07 DIAGNOSIS — R102 Pelvic and perineal pain: Secondary | ICD-10-CM | POA: Diagnosis present

## 2015-09-07 DIAGNOSIS — N946 Dysmenorrhea, unspecified: Secondary | ICD-10-CM | POA: Diagnosis present

## 2015-09-07 DIAGNOSIS — N9972 Accidental puncture and laceration of a genitourinary system organ or structure during other procedure: Secondary | ICD-10-CM

## 2015-09-07 DIAGNOSIS — F17210 Nicotine dependence, cigarettes, uncomplicated: Secondary | ICD-10-CM

## 2015-09-07 DIAGNOSIS — G8929 Other chronic pain: Secondary | ICD-10-CM

## 2015-09-07 HISTORY — PX: BILATERAL SALPINGECTOMY: SHX5743

## 2015-09-07 HISTORY — DX: Acquired absence of both cervix and uterus: Z90.710

## 2015-09-07 HISTORY — PX: ABDOMINAL HYSTERECTOMY: SHX81

## 2015-09-07 LAB — PREGNANCY, URINE: PREG TEST UR: NEGATIVE

## 2015-09-07 SURGERY — HYSTERECTOMY, ABDOMINAL
Anesthesia: General | Site: Abdomen

## 2015-09-07 MED ORDER — FENTANYL CITRATE (PF) 100 MCG/2ML IJ SOLN
INTRAMUSCULAR | Status: AC
  Filled 2015-09-07: qty 4

## 2015-09-07 MED ORDER — FENTANYL CITRATE (PF) 250 MCG/5ML IJ SOLN
INTRAMUSCULAR | Status: AC
  Filled 2015-09-07: qty 25

## 2015-09-07 MED ORDER — PROPOFOL 10 MG/ML IV BOLUS
INTRAVENOUS | Status: DC | PRN
  Administered 2015-09-07: 50 mg via INTRAVENOUS
  Administered 2015-09-07: 150 mg via INTRAVENOUS

## 2015-09-07 MED ORDER — HYDROMORPHONE 1 MG/ML IV SOLN
INTRAVENOUS | Status: DC
  Administered 2015-09-07: 19:00:00 via INTRAVENOUS
  Administered 2015-09-07 – 2015-09-08 (×2): 0.9 mg via INTRAVENOUS
  Administered 2015-09-08: 2.4 mg via INTRAVENOUS
  Administered 2015-09-08: 1.2 mg via INTRAVENOUS
  Filled 2015-09-07: qty 25

## 2015-09-07 MED ORDER — METOCLOPRAMIDE HCL 5 MG/ML IJ SOLN: 10.0000 mg | Freq: Once | INTRAMUSCULAR | Status: DC | PRN

## 2015-09-07 MED ORDER — DIPHENHYDRAMINE HCL 50 MG/ML IJ SOLN: 12.5000 mg | Freq: Four times a day (QID) | INTRAMUSCULAR | Status: DC | PRN

## 2015-09-07 MED ORDER — NEOSTIGMINE METHYLSULFATE 10 MG/10ML IV SOLN
INTRAVENOUS | Status: DC | PRN
  Administered 2015-09-07: 3 mg via INTRAVENOUS

## 2015-09-07 MED ORDER — INFLUENZA VAC SPLIT QUAD 0.5 ML IM SUSY
0.5000 mL | PREFILLED_SYRINGE | INTRAMUSCULAR | Status: AC
  Administered 2015-09-08: 0.5 mL via INTRAMUSCULAR
  Filled 2015-09-07: qty 0.5

## 2015-09-07 MED ORDER — LACTATED RINGERS IV SOLN
INTRAVENOUS | Status: DC
  Administered 2015-09-07: 125 mL/h via INTRAVENOUS
  Administered 2015-09-07: 15:00:00 via INTRAVENOUS
  Administered 2015-09-07: 125 mL/h via INTRAVENOUS

## 2015-09-07 MED ORDER — LABETALOL HCL 5 MG/ML IV SOLN
INTRAVENOUS | Status: AC
  Filled 2015-09-07: qty 4

## 2015-09-07 MED ORDER — GLYCOPYRROLATE 0.2 MG/ML IJ SOLN
INTRAMUSCULAR | Status: AC
  Filled 2015-09-07: qty 2

## 2015-09-07 MED ORDER — CEFAZOLIN SODIUM-DEXTROSE 2-3 GM-% IV SOLR: 2.0000 g | INTRAVENOUS | Status: DC

## 2015-09-07 MED ORDER — MEPERIDINE HCL 25 MG/ML IJ SOLN: 6.2500 mg | INTRAMUSCULAR | Status: DC | PRN

## 2015-09-07 MED ORDER — HYDROMORPHONE HCL 1 MG/ML IJ SOLN
0.2500 mg | INTRAMUSCULAR | Status: DC | PRN
  Administered 2015-09-07 (×4): 0.5 mg via INTRAVENOUS

## 2015-09-07 MED ORDER — HYDROMORPHONE HCL 1 MG/ML IJ SOLN
INTRAMUSCULAR | Status: AC
  Administered 2015-09-07: 0.5 mg via INTRAVENOUS
  Filled 2015-09-07: qty 1

## 2015-09-07 MED ORDER — LIDOCAINE HCL (CARDIAC) 20 MG/ML IV SOLN
INTRAVENOUS | Status: AC
  Filled 2015-09-07: qty 5

## 2015-09-07 MED ORDER — ROCURONIUM BROMIDE 100 MG/10ML IV SOLN
INTRAVENOUS | Status: AC
  Filled 2015-09-07: qty 1

## 2015-09-07 MED ORDER — LABETALOL HCL 5 MG/ML IV SOLN
INTRAVENOUS | Status: DC | PRN
  Administered 2015-09-07 (×2): 5 mg via INTRAVENOUS

## 2015-09-07 MED ORDER — ONDANSETRON HCL 4 MG/2ML IJ SOLN
INTRAMUSCULAR | Status: AC
  Filled 2015-09-07: qty 2

## 2015-09-07 MED ORDER — MIDAZOLAM HCL 2 MG/2ML IJ SOLN
INTRAMUSCULAR | Status: DC | PRN
  Administered 2015-09-07: 2 mg via INTRAVENOUS

## 2015-09-07 MED ORDER — LACTATED RINGERS IV SOLN
INTRAVENOUS | Status: DC
  Administered 2015-09-07 – 2015-09-08 (×3): via INTRAVENOUS

## 2015-09-07 MED ORDER — BUPIVACAINE HCL (PF) 0.25 % IJ SOLN
INTRAMUSCULAR | Status: DC | PRN
  Administered 2015-09-07: 20 mL

## 2015-09-07 MED ORDER — SCOPOLAMINE 1 MG/3DAYS TD PT72
MEDICATED_PATCH | TRANSDERMAL | Status: AC
  Filled 2015-09-07: qty 1

## 2015-09-07 MED ORDER — MIDAZOLAM HCL 2 MG/2ML IJ SOLN
INTRAMUSCULAR | Status: AC
  Filled 2015-09-07: qty 4

## 2015-09-07 MED ORDER — BUPIVACAINE HCL (PF) 0.25 % IJ SOLN
INTRAMUSCULAR | Status: AC
  Filled 2015-09-07: qty 30

## 2015-09-07 MED ORDER — DIPHENHYDRAMINE HCL 12.5 MG/5ML PO ELIX: 12.5000 mg | ORAL_SOLUTION | Freq: Four times a day (QID) | ORAL | Status: DC | PRN

## 2015-09-07 MED ORDER — NEOSTIGMINE METHYLSULFATE 10 MG/10ML IV SOLN
INTRAVENOUS | Status: AC
  Filled 2015-09-07: qty 1

## 2015-09-07 MED ORDER — HYDROMORPHONE HCL 1 MG/ML IJ SOLN
INTRAMUSCULAR | Status: AC
  Filled 2015-09-07: qty 1

## 2015-09-07 MED ORDER — GLYCOPYRROLATE 0.2 MG/ML IJ SOLN
INTRAMUSCULAR | Status: DC | PRN
  Administered 2015-09-07: .5 mg via INTRAVENOUS

## 2015-09-07 MED ORDER — STERILE WATER FOR IRRIGATION IR SOLN
Status: DC | PRN
  Administered 2015-09-07: 1000 mL

## 2015-09-07 MED ORDER — HYDROMORPHONE HCL 1 MG/ML IJ SOLN
1.0000 mg | Freq: Once | INTRAMUSCULAR | Status: AC
  Administered 2015-09-07: 1 mg via INTRAVENOUS

## 2015-09-07 MED ORDER — LIDOCAINE HCL (CARDIAC) 20 MG/ML IV SOLN
INTRAVENOUS | Status: DC | PRN
  Administered 2015-09-07 (×2): 50 mg via INTRAVENOUS

## 2015-09-07 MED ORDER — TRIAMTERENE-HCTZ 37.5-25 MG PO TABS
1.0000 | ORAL_TABLET | Freq: Every day | ORAL | Status: DC
  Administered 2015-09-08: 1 via ORAL
  Filled 2015-09-07 (×3): qty 1

## 2015-09-07 MED ORDER — PROPOFOL 10 MG/ML IV BOLUS
INTRAVENOUS | Status: AC
  Filled 2015-09-07: qty 20

## 2015-09-07 MED ORDER — FENTANYL CITRATE (PF) 100 MCG/2ML IJ SOLN
INTRAMUSCULAR | Status: DC | PRN
  Administered 2015-09-07 (×3): 100 ug via INTRAVENOUS
  Administered 2015-09-07: 50 ug via INTRAVENOUS

## 2015-09-07 MED ORDER — ONDANSETRON HCL 4 MG/2ML IJ SOLN: 4.0000 mg | Freq: Four times a day (QID) | INTRAMUSCULAR | Status: DC | PRN

## 2015-09-07 MED ORDER — CEFAZOLIN SODIUM-DEXTROSE 2-3 GM-% IV SOLR
INTRAVENOUS | Status: AC
  Administered 2015-09-07: 2 g via INTRAVENOUS
  Filled 2015-09-07: qty 50

## 2015-09-07 MED ORDER — ROCURONIUM BROMIDE 100 MG/10ML IV SOLN
INTRAVENOUS | Status: DC | PRN
  Administered 2015-09-07 (×2): 10 mg via INTRAVENOUS
  Administered 2015-09-07: 5 mg via INTRAVENOUS
  Administered 2015-09-07: 35 mg via INTRAVENOUS

## 2015-09-07 MED ORDER — NALOXONE HCL 0.4 MG/ML IJ SOLN: 0.4000 mg | INTRAMUSCULAR | Status: DC | PRN

## 2015-09-07 MED ORDER — SCOPOLAMINE 1 MG/3DAYS TD PT72
1.0000 | MEDICATED_PATCH | Freq: Once | TRANSDERMAL | Status: DC
  Administered 2015-09-07: 1.5 mg via TRANSDERMAL

## 2015-09-07 MED ORDER — PNEUMOCOCCAL VAC POLYVALENT 25 MCG/0.5ML IJ INJ
0.5000 mL | INJECTION | INTRAMUSCULAR | Status: AC
  Administered 2015-09-08: 0.5 mL via INTRAMUSCULAR
  Filled 2015-09-07: qty 0.5

## 2015-09-07 MED ORDER — DEXAMETHASONE SODIUM PHOSPHATE 10 MG/ML IJ SOLN
INTRAMUSCULAR | Status: DC | PRN
  Administered 2015-09-07: 10 mg via INTRAVENOUS

## 2015-09-07 MED ORDER — SODIUM CHLORIDE 0.9 % IJ SOLN: 9.0000 mL | INTRAMUSCULAR | Status: DC | PRN

## 2015-09-07 MED ORDER — ONDANSETRON HCL 4 MG/2ML IJ SOLN
INTRAMUSCULAR | Status: DC | PRN
Start: 2015-09-07 — End: 2015-09-07
  Administered 2015-09-07: 4 mg via INTRAVENOUS

## 2015-09-07 SURGICAL SUPPLY — 41 items
ATTRACTOMAT 16X20 MAGNETIC DRP (DRAPES) ×2 IMPLANT
BRR ADH 6X5 SEPRAFILM 1 SHT (MISCELLANEOUS)
CANISTER SUCT 3000ML (MISCELLANEOUS) ×4 IMPLANT
CLOTH BEACON ORANGE TIMEOUT ST (SAFETY) ×4 IMPLANT
CONT PATH 16OZ SNAP LID 3702 (MISCELLANEOUS) ×4 IMPLANT
DECANTER SPIKE VIAL GLASS SM (MISCELLANEOUS) IMPLANT
DRAPE WARM FLUID 44X44 (DRAPE) IMPLANT
DRSG OPSITE POSTOP 4X10 (GAUZE/BANDAGES/DRESSINGS) ×4 IMPLANT
DURAPREP 26ML APPLICATOR (WOUND CARE) ×4 IMPLANT
GAUZE SPONGE 4X4 16PLY XRAY LF (GAUZE/BANDAGES/DRESSINGS) ×2 IMPLANT
GLOVE BIOGEL PI IND STRL 6.5 (GLOVE) ×2 IMPLANT
GLOVE BIOGEL PI IND STRL 7.0 (GLOVE) ×6 IMPLANT
GLOVE BIOGEL PI INDICATOR 6.5 (GLOVE) ×2
GLOVE BIOGEL PI INDICATOR 7.0 (GLOVE) ×6
GLOVE SURG SS PI 6.0 STRL IVOR (GLOVE) ×4 IMPLANT
GOWN STRL REUS W/TWL LRG LVL3 (GOWN DISPOSABLE) ×12 IMPLANT
NEEDLE HYPO 22GX1.5 SAFETY (NEEDLE) ×4 IMPLANT
NS IRRIG 1000ML POUR BTL (IV SOLUTION) ×6 IMPLANT
PACK ABDOMINAL GYN (CUSTOM PROCEDURE TRAY) ×4 IMPLANT
PAD ABD 8X7 1/2 STERILE (GAUZE/BANDAGES/DRESSINGS) ×2 IMPLANT
PAD OB MATERNITY 4.3X12.25 (PERSONAL CARE ITEMS) ×4 IMPLANT
PENCIL SMOKE EVAC W/HOLSTER (ELECTROSURGICAL) ×2 IMPLANT
PROTECTOR NERVE ULNAR (MISCELLANEOUS) ×4 IMPLANT
SEPRAFILM MEMBRANE 5X6 (MISCELLANEOUS) IMPLANT
SPONGE GAUZE 4X4 12PLY STER LF (GAUZE/BANDAGES/DRESSINGS) ×2 IMPLANT
SPONGE LAP 18X18 X RAY DECT (DISPOSABLE) ×2 IMPLANT
SUT PDS AB 1 CT1 36 (SUTURE) IMPLANT
SUT VIC AB 0 CT1 18XCR BRD8 (SUTURE) ×6 IMPLANT
SUT VIC AB 0 CT1 36 (SUTURE) ×12 IMPLANT
SUT VIC AB 0 CT1 8-18 (SUTURE) ×12
SUT VIC AB 2-0 CT1 27 (SUTURE) ×4
SUT VIC AB 2-0 CT1 TAPERPNT 27 (SUTURE) IMPLANT
SUT VIC AB 3-0 SH 27 (SUTURE) ×8
SUT VIC AB 3-0 SH 27X BRD (SUTURE) IMPLANT
SUT VIC AB 4-0 KS 27 (SUTURE) ×4 IMPLANT
SUT VICRYL 0 TIES 12 18 (SUTURE) ×4 IMPLANT
SYR CONTROL 10ML LL (SYRINGE) ×4 IMPLANT
TAPE CLOTH SURG 4X10 WHT LF (GAUZE/BANDAGES/DRESSINGS) ×2 IMPLANT
TOWEL OR 17X24 6PK STRL BLUE (TOWEL DISPOSABLE) ×8 IMPLANT
TRAY FOLEY CATH SILVER 14FR (SET/KITS/TRAYS/PACK) ×4 IMPLANT
WATER STERILE IRR 1000ML POUR (IV SOLUTION) ×2 IMPLANT

## 2015-09-07 NOTE — Transfer of Care (Signed)
Immediate Anesthesia Transfer of Care Note  Patient: Cynthia Welch  Procedure(s) Performed: Procedure(s) with comments: HYSTERECTOMY ABDOMINAL (N/A) - Requested 09/07/15 @ 2:15p BILATERAL SALPINGECTOMY (Bilateral)  Patient Location: PACU  Anesthesia Type:General  Level of Consciousness: awake, alert  and oriented  Airway & Oxygen Therapy: Patient Spontanous Breathing and Patient connected to nasal cannula oxygen  Post-op Assessment: Report given to RN and Post -op Vital signs reviewed and stable  Post vital signs: Reviewed and stable  Last Vitals:  Filed Vitals:   09/07/15 1651  BP:   Pulse: 67  Temp: 37.1 C  Resp: 16    Complications: No apparent anesthesia complications

## 2015-09-07 NOTE — OR Nursing (Signed)
Charted per Therese Sarah RN. Did not realize Gaylord Shih was logged onto epic.

## 2015-09-07 NOTE — Anesthesia Procedure Notes (Signed)
Procedure Name: Intubation Date/Time: 09/07/2015 3:03 PM Performed by: Elenore Paddy Pre-anesthesia Checklist: Patient identified, Emergency Drugs available, Suction available, Patient being monitored and Timeout performed Patient Re-evaluated:Patient Re-evaluated prior to inductionOxygen Delivery Method: Circle system utilized Preoxygenation: Pre-oxygenation with 100% oxygen Intubation Type: IV induction Ventilation: Mask ventilation without difficulty Laryngoscope Size: Mac and 3 Grade View: Grade II Tube type: Oral Tube size: 7.0 mm Number of attempts: 1 Airway Equipment and Method: Stylet Placement Confirmation: ETT inserted through vocal cords under direct vision,  positive ETCO2 and breath sounds checked- equal and bilateral Secured at: 23 cm Tube secured with: Tape Dental Injury: Teeth and Oropharynx as per pre-operative assessment

## 2015-09-07 NOTE — Anesthesia Preprocedure Evaluation (Signed)
Anesthesia Evaluation  Patient identified by MRN, date of birth, ID band Patient awake    Reviewed: Allergy & Precautions, NPO status , Patient's Chart, lab work & pertinent test results, reviewed documented beta blocker date and time   History of Anesthesia Complications (+) PONV and history of anesthetic complications  Airway Mallampati: II  TM Distance: >3 FB Neck ROM: Full    Dental  (+) Upper Dentures, Lower Dentures   Pulmonary Current Smoker,    Pulmonary exam normal breath sounds clear to auscultation       Cardiovascular hypertension, Pt. on medications Normal cardiovascular exam Rhythm:Regular Rate:Normal     Neuro/Psych  Headaches, Seizures -, Well Controlled,  PSYCHIATRIC DISORDERS    GI/Hepatic negative GI ROS, Neg liver ROS,   Endo/Other  Hyperlipidemia  Renal/GU negative Renal ROS  negative genitourinary   Musculoskeletal negative musculoskeletal ROS (+)   Abdominal   Peds  Hematology negative hematology ROS (+)   Anesthesia Other Findings   Reproductive/Obstetrics Fibroid uterus Pelvic pain                             Anesthesia Physical Anesthesia Plan  ASA: II  Anesthesia Plan: General   Post-op Pain Management:    Induction: Intravenous  Airway Management Planned: Oral ETT  Additional Equipment:   Intra-op Plan:   Post-operative Plan: Extubation in OR  Informed Consent: I have reviewed the patients History and Physical, chart, labs and discussed the procedure including the risks, benefits and alternatives for the proposed anesthesia with the patient or authorized representative who has indicated his/her understanding and acceptance.   Dental advisory given  Plan Discussed with: CRNA, Anesthesiologist and Surgeon  Anesthesia Plan Comments:         Anesthesia Quick Evaluation

## 2015-09-07 NOTE — Op Note (Signed)
Cynthia Welch PROCEDURE DATE: 09/07/2015  PREOPERATIVE DIAGNOSIS:  54 y.o. G0 with symptomatic fibroids, menorrhagia POSTOPERATIVE DIAGNOSIS:  Same SURGEON:   Mora Bellman, M.D. ASSISTANT:   Verita Schneiders, M.D. OPERATION:  Total abdominal hysterectomy with Bilateral Salpingectomy, cystotomy repair ANESTHESIA:  General endotracheal.  INDICATIONS: The patient is a 54 y.o. G0 with history of symptomatic uterine fibroids/menorrhagia. The patient made a decision to undergo definite surgical treatment. On the preoperative visit, the risks, benefits, indications, and alternatives of the procedure were reviewed with the patient.  On the day of surgery, the risks of surgery were again discussed with the patient including but not limited to: bleeding which may require transfusion or reoperation; infection which may require antibiotics; injury to bowel, bladder, ureters or other surrounding organs; need for additional procedures; thromboembolic phenomenon, incisional problems and other postoperative/anesthesia complications. Written informed consent was obtained.    OPERATIVE FINDINGS: A 20 week size uterus with a weight of 1679 gm with normal tubes and ovaries bilaterally. Inadvertent cystotomy during blunt dissection on the dome of the bladder  ESTIMATED BLOOD LOSS: 300 ml FLUIDS:  1700 ml of Lactated Ringers URINE OUTPUT:  150 ml of clear yellow urine. SPECIMENS:  Uterus and tubes sent to pathology COMPLICATIONS:  None immediate.   DESCRIPTION OF PROCEDURE: The patient received intravenous antibiotics and had sequential compression devices applied to her lower extremities while in the preoperative area.   She was taken to the operating room where anesthesia was induced and found to be adequate. The patient was placed in supine position. The abdomen and perineum were prepped and draped in a sterile manner, and a Foley catheter was inserted into the bladder and attached to Cynthia Welch drainage. After  an adequate timeout was performed, a Pfannensteil skin incision was made. This incision was taken down to the fascia using electrocautery with care given to maintain good hemostasis. The fascia was incised in the midline and the fascial incision was then extended bilaterally using mayo scissors. The fascia was then dissected off the underlying rectus muscles using blunt and sharp dissection. The rectus muscles were split bluntly in the midline and the peritoneum entered sharply without complication. This peritoneal incision was then extended superiorly and inferiorly with care given to prevent bowel or bladder injury. Upon entry into the abdominal cavity, the upper abdomen was inspected and found to be normal. Attention was then turned to the pelvis. A retractor was placed into the incision, and the bowel was packed away with moist laparotomy sponges. The uterus at this point was noted to be mobilized. The round ligaments on each side were clamped, suture ligated, and transected with electrocautery allowing entry into the broad ligament. The anterior and posterior leaves of the broad ligament were separated and the ureters were inspected to be safely away from the area of dissection bilaterally. A hole was created in the clear portion of the posterior broad ligament. Adnexae were clamped on the patient's right side. This pedicle was then clamped, cut, and doubly suture ligated with good hemostasis. This procedure was repeated in an identical fashion on the left site allowing for both adnexa to remain in place.  A bladder flap was then created across the anterior leaf of the broad ligament and the bladder was then bluntly dissected off the lower uterine segment and cervix with good hemostasis. The uterine arteries were then skeletonized bilaterally and then clamped, cut, and ligated with care given to prevent ureteral injury. The uterosacral ligaments were then clamped, cut, and ligated bilaterally.  Finally, the  cardinal ligaments were clamped, cut, and ligated bilaterally.  Acutely curved clamps were placed across the vagina just under the cervix, and the specimen was amputated and sent to pathology. The vaginal cuff was closed with a series of interrupted 0 Vicryl figure-of-eight sutures with care given to incorporate the anterior pubocervical fascia and the posterior rectovaginal fascia.  The vaginal cuff angles were noted to have good hemostasis. The fallopian tubes on both sides were grasped with a babcock clamps, Clamped with a Kelly clamp, transected and suture ligated. The pelvis was irrigated and hemostasis was reconfirmed at all pedicles and along the pelvic sidewall. The bladder cystotomy was repaired with 3.0 Vicryl in 3 layers. Complete seal of the bladder was confirmed by back filling the bladder.  All laparotomy sponges and instruments were removed from the abdomen. The peritoneum was closed with 2-0 Vicryl, and the fascia was closed with 0 Vicryl in a running fashion. The skin was closed with a 4-0 Monocryl subcuticular stitch. Sponge, lap, needle, and instrument counts were correct times two. The patient was taken to the recovery area awake, extubated and in stable condition.

## 2015-09-07 NOTE — Anesthesia Postprocedure Evaluation (Signed)
  Anesthesia Post-op Note  Patient: Cynthia Welch  Procedure(s) Performed: Procedure(s) (LRB): HYSTERECTOMY ABDOMINAL, CYSTOTOMY WITH BLADDER REPAIR (N/A) BILATERAL SALPINGECTOMY (Bilateral)  Patient Location: PACU  Anesthesia Type: General  Level of Consciousness: awake and alert   Airway and Oxygen Therapy: Patient Spontanous Breathing  Post-op Pain: mild  Post-op Assessment: Post-op Vital signs reviewed, Patient's Cardiovascular Status Stable, Respiratory Function Stable, Patent Airway and No signs of Nausea or vomiting  Last Vitals:  Filed Vitals:   09/07/15 1700  BP: 117/84  Pulse: 68  Temp:   Resp: 16    Post-op Vital Signs: stable   Complications: No apparent anesthesia complications

## 2015-09-08 ENCOUNTER — Encounter (HOSPITAL_COMMUNITY): Payer: Self-pay | Admitting: Obstetrics and Gynecology

## 2015-09-08 LAB — CBC
HEMATOCRIT: 32.8 % — AB (ref 36.0–46.0)
Hemoglobin: 11.2 g/dL — ABNORMAL LOW (ref 12.0–15.0)
MCH: 31.3 pg (ref 26.0–34.0)
MCHC: 34.1 g/dL (ref 30.0–36.0)
MCV: 91.6 fL (ref 78.0–100.0)
PLATELETS: 243 10*3/uL (ref 150–400)
RBC: 3.58 MIL/uL — ABNORMAL LOW (ref 3.87–5.11)
RDW: 13.6 % (ref 11.5–15.5)
WBC: 15.6 10*3/uL — AB (ref 4.0–10.5)

## 2015-09-08 MED ORDER — OXYCODONE-ACETAMINOPHEN 5-325 MG PO TABS
1.0000 | ORAL_TABLET | Freq: Four times a day (QID) | ORAL | Status: DC | PRN
Start: 2015-09-08 — End: 2015-09-09
  Administered 2015-09-08: 2 via ORAL
  Administered 2015-09-08: 1 via ORAL
  Administered 2015-09-09: 2 via ORAL
  Filled 2015-09-08 (×2): qty 2
  Filled 2015-09-08: qty 1

## 2015-09-08 MED ORDER — IBUPROFEN 600 MG PO TABS: 600.0000 mg | ORAL_TABLET | Freq: Four times a day (QID) | ORAL | Status: DC | PRN

## 2015-09-08 NOTE — Progress Notes (Signed)
1 Day Post-Op Procedure(s) (LRB): HYSTERECTOMY ABDOMINAL, CYSTOTOMY WITH BLADDER REPAIR (N/A) BILATERAL SALPINGECTOMY (Bilateral)  Subjective: Patient reports doing well. She denies nausea or emesis. Her pain is well controlled with PCA.     Objective: I have reviewed patient's vital signs, intake and output and labs.  General: alert, cooperative and no distress Resp: clear to auscultation bilaterally Cardio: regular rate and rhythm GI: incision: clean, dry and intact and soft, appropriately tender, non distended. Normal bowel sounds Extremities: Homans sign is negative, no sign of DVT and no edema, redness or tenderness in the calves or thighs  Assessment: s/p Procedure(s) with comments: HYSTERECTOMY ABDOMINAL, CYSTOTOMY WITH BLADDER REPAIR (N/A) - Requested 09/07/15 @ 2:15p BILATERAL SALPINGECTOMY (Bilateral): stable and progressing well  Plan: Advance diet Encourage ambulation Advance to PO medication Discontinue IV fluids Continue foley due to cystotomy. Change to a leg bag. Patient was made aware that it will stay in place for 7 days  Discharge planning in the AM  LOS: 1 day    Cynthia Welch 09/08/2015, 6:38 AM

## 2015-09-08 NOTE — Anesthesia Postprocedure Evaluation (Signed)
  Anesthesia Post-op Note  Patient: Cynthia Welch  Procedure(s) Performed: Procedure(s) with comments: HYSTERECTOMY ABDOMINAL, CYSTOTOMY WITH BLADDER REPAIR (N/A) - Requested 09/07/15 @ 2:15p BILATERAL SALPINGECTOMY (Bilateral)  Patient Location: PACU and Women[facility] Unit  Anesthesia Type:General  Level of Consciousness: awake, alert  and oriented  Airway and Oxygen Therapy: Patient Spontanous Breathing and Patient connected to nasal cannula oxygen  Post-op Pain: mild  Post-op Assessment: Patient's Cardiovascular Status Stable, Respiratory Function Stable, No signs of Nausea or vomiting, Adequate PO intake and Pain level controlled              Post-op Vital Signs: Reviewed and stable  Last Vitals:  Filed Vitals:   09/08/15 0538  BP: 125/75  Pulse: 72  Temp: 37.1 C  Resp: 16    Complications: No apparent anesthesia complications

## 2015-09-08 NOTE — Addendum Note (Signed)
Addendum  created 09/08/15 0850 by Elenore Paddy, CRNA   Modules edited: Notes Section   Notes Section:  File: [id number]

## 2015-09-08 NOTE — Plan of Care (Signed)
Problem: Tissue Perfusion: Goal: Risk factors for ineffective tissue perfusion will decrease Outcome: Completed/Met Date Met:  09/08/15 Pt is wearing SCDs and has begun ambulating.

## 2015-09-09 MED ORDER — IBUPROFEN 600 MG PO TABS: 600.0000 mg | ORAL_TABLET | Freq: Four times a day (QID) | ORAL | Status: DC | PRN

## 2015-09-09 MED ORDER — OXYCODONE-ACETAMINOPHEN 5-325 MG PO TABS: 1.0000 | ORAL_TABLET | Freq: Four times a day (QID) | ORAL | Status: DC | PRN

## 2015-09-09 MED ORDER — DOCUSATE SODIUM 100 MG PO CAPS: 100.0000 mg | ORAL_CAPSULE | Freq: Two times a day (BID) | ORAL | Status: DC | PRN

## 2015-09-09 NOTE — Discharge Summary (Signed)
Physician Discharge Summary  [name] MRN: [id number] DOB/AGE: Jun 17, 1961 54 y.o.  Admit date: 09/07/2015 Discharge date: 09/09/2015  Admission Diagnoses: pelvic pain and fibroid uterus  Discharge Diagnoses: s/p hysterectomy and cystotomy repair Active Problems:   S/P TAH (total abdominal hysterectomy)   Discharged Condition: good  Hospital Course: Patient admitted for scheduled TAH with bilateral salpingectomy. Patient had her uterus, cervix and bilateral fallopian tubes removes. A cystotomy was noted and repaired in 3 layers. The patient had an uneventful post operative hospitalization. She tolerated her diet, ambulated and passed flatus. She received teaching regarding care for the leg bag. Discharge instructions were provided to the patient. The leg bag will be removed on Tuesday, November 22 at [facility] hospital clinic (7 days after placement) and the patient has a follow up appointment scheduled on 12/21 with me.  Consults: None  Significant Diagnostic Studies: labs: pre-op Hg 12--> post op Hg 11.2  Treatments: surgery: TAH with bilateral salpingectomy and cystotomy repair  Discharge Exam: Blood pressure 134/77, pulse 104, temperature 98.9 F (37.2 C), temperature source Oral, resp. rate 18, height 5\' 8"  (1.727 m), weight 180 lb (81.647 kg), SpO2 100 %. General appearance: alert, cooperative and no distress Resp: clear to auscultation bilaterally Cardio: regular rate and rhythm GI: soft, non-tender; bowel sounds normal; no masses,  no organomegaly Extremities: Homans sign is negative, no sign of DVT and no edema, redness or tenderness in the calves or thighs Incision/Wound:clean dry and intact  Disposition: 01-Home or Self Care     Medication List    ASK your doctor about these medications        folic acid 1 MG tablet  Commonly known as:  FOLVITE  Take 1 tablet (1 mg total) by mouth daily.     Levetiracetam 750 MG Tb24  Take 1 tablet (750 mg  total) by mouth 2 (two) times daily.     traMADol 50 MG tablet  Commonly known as:  ULTRAM  TAKE 1 TAB BY MOUTH EVERY 6 HOURS AS NEEDED FOR 15 DAYS.     triamterene-hydrochlorothiazide 37.5-25 MG tablet  Commonly known as:  MAXZIDE-25  Take 1 tablet by mouth daily.     Vitamin D3 2000 UNITS Tabs  Take 1 tablet by mouth daily.       Follow-up Information    Follow up with [facility].   Specialty:  Obstetrics and Gynecology   Why:  An appointment will be made for you to return on tuesday 11/22 to remove the leg bag   Contact information:   [address] [place] [phone]      Signed: [place] 09/09/2015, 12:08 PM

## 2015-09-09 NOTE — Progress Notes (Signed)
Pt is discharged in the care offriend. Downstairs per ambulatory with N.T escort. Spirits are good. Denies any pain or discomfort.Marland Kitchen Discharged  Instructions with Rx were given to pt. Questions asked and answered. Foley in place and draining well Questions were asked and answered. Pt. Aware of all foley care instucrions.

## 2015-09-09 NOTE — Discharge Instructions (Signed)
When you return on Tuesday to have the leg bag removed. Bring a bottle of water to try to void before leaving the office  Abdominal Hysterectomy Abdominal hysterectomy is a surgical procedure to remove your womb (uterus). Your uterus is the muscular organ that contains a developing baby. This surgery is done for many reasons. You may need an abdominal hysterectomy if you have cancer, growths (tumors), long-term pain, or bleeding. You may also have this procedure if your uterus has slipped down into your vagina (uterine prolapse). Depending on why you need an abdominal hysterectomy, you may also have other reproductive organs removed. These could include the part of your vagina that connects with your uterus (cervix), the organs that make eggs (ovaries), and the tubes that connect the ovaries to the uterus (fallopian tubes). LET [facility] CARE PROVIDER KNOW ABOUT:   Any allergies you have.  All medicines you are taking, including vitamins, herbs, eye drops, creams, and over-the-counter medicines.  Previous problems you or members of your family have had with the use of anesthetics.  Any blood disorders you have.  Previous surgeries you have had.  Medical conditions you have. RISKS AND COMPLICATIONS Generally, this is a safe procedure. However, as with any procedure, problems can occur. Infection is the most common problem after an abdominal hysterectomy. Other possible problems include:  Bleeding.  Formation of blood clots that may break free and travel to your lungs.  Injury to other organs near your uterus.  Nerve injury causing nerve pain.  Decreased interest in sex or pain during sexual intercourse. BEFORE THE PROCEDURE  Abdominal hysterectomy is a major surgical procedure. It can affect the way you feel about yourself. Talk to your health care provider about the physical and emotional changes hysterectomy may cause.  You may need to have blood work and X-rays done before  surgery.  Quit smoking if you smoke. Ask your health care provider for help if you are struggling to quit.  Stop taking medicines that thin your blood as directed by your health care provider.  You may be instructed to take antibiotic medicines or laxatives before surgery.  Do not eat or drink anything for 6-8 hours before surgery.  Take your regular medicines with a small sip of water.  Bathe or shower the night or morning before surgery. PROCEDURE  Abdominal hysterectomy is done in the operating room at the hospital.  In most cases, you will be given a medicine that makes you go to sleep (general anesthetic).  The surgeon will make a cut (incision) through the skin in your lower belly.  The incision may be about 5-7 inches long. It may go side-to-side or up-and-down.  The surgeon will move aside the body tissue that covers your uterus. The surgeon will then carefully take out your uterus along with any of your other reproductive organs that need to be removed.  Bleeding will be controlled with clamps or sutures.  The surgeon will close your incision with sutures or metal clips. AFTER THE PROCEDURE  You will have some pain immediately after the procedure.  You will be given pain medicine in the recovery room.  You will be taken to your hospital room when you have recovered from the anesthesia.  You may need to stay in the hospital for 2-5 days.  You will be given instructions for recovery at home.   This information is not intended to replace advice given to you by your health care provider. Make sure you discuss any  questions you have with your health care provider.   Document Released: 10/14/2013 Document Reviewed: 10/14/2013 Elsevier Interactive Patient Education [employer].

## 2015-09-13 ENCOUNTER — Encounter (HOSPITAL_COMMUNITY): Payer: Self-pay | Admitting: *Deleted

## 2015-09-14 ENCOUNTER — Ambulatory Visit: Payer: Self-pay | Admitting: *Deleted

## 2015-09-14 DIAGNOSIS — Z466 Encounter for fitting and adjustment of urinary device: Secondary | ICD-10-CM

## 2015-09-14 NOTE — Progress Notes (Signed)
Pt is 7 days post op from TAH, bilateral salpingectomy and cystotomy repair. She reports no problems. Foley removed in the usual fashion with tip intact.  Pt had no bleeding or pain and tolerated procedure well.  Pt advised to drink plenty of fluids and to return to [facility] if she has not voided within 6 hours. Pt voiced understanding of instructions given. Follow up appt on 12/21.

## 2015-10-13 ENCOUNTER — Ambulatory Visit (INDEPENDENT_AMBULATORY_CARE_PROVIDER_SITE_OTHER): Payer: Self-pay | Admitting: Obstetrics and Gynecology

## 2015-10-13 ENCOUNTER — Encounter: Payer: Self-pay | Admitting: Obstetrics and Gynecology

## 2015-10-13 VITALS — BP 143/77 | HR 93 | Temp 98.4°F | Wt 176.1 lb

## 2015-10-13 DIAGNOSIS — Z9889 Other specified postprocedural states: Secondary | ICD-10-CM

## 2015-10-13 MED ORDER — TRAMADOL HCL 50 MG PO TABS: 50.0000 mg | ORAL_TABLET | Freq: Four times a day (QID) | ORAL | Status: DC | PRN

## 2015-10-13 NOTE — Progress Notes (Signed)
[name], female   DOB: Nov 17, 1960, 54 y.o.   MRN: [id number] 54 yo here for post op check s/p TAH on 09/07/2015. Patient reports feeling well since the procedure. She still is experiencing some abdominal pain but is grateful that she has had this surgery. Patient denies fever, chills, abnormal drainage from her incision. She denies any vaginal bleeding.  Past Medical History  Diagnosis Date  . PONV (postoperative nausea and vomiting)   . Fibroids   . Subdural hematoma ([place]) 01/29/2013    Per MRI 01/2013  . Hypertension   . Seizures ([place])     5-6 months ago last seizure    . Headache    Past Surgical History  Procedure Laterality Date  . Orif ulnar fracture  09/03/2011    Procedure: OPEN REDUCTION INTERNAL FIXATION (ORIF) ULNAR FRACTURE;  Surgeon: Meredith Pel;  Location: [facility];  Service: Orthopedics;  Laterality: Right;  . Orif ulnar fracture  12/14/2011    Procedure: OPEN REDUCTION INTERNAL FIXATION (ORIF) ULNAR FRACTURE;  Surgeon: Meredith Pel, MD;  Location: [facility];  Service: Orthopedics;  Laterality: Right;  Right ulnar shaft open reduction, internal fixation with right iliac crest bone graft  . Tonsillectomy    . Diagnostic laparoscopy    . Dilation and curettage of uterus    . Abdominal hysterectomy N/A 09/07/2015    Procedure: HYSTERECTOMY ABDOMINAL, CYSTOTOMY WITH BLADDER REPAIR;  Surgeon: Mora Bellman, MD;  Location: [facility];  Service: Gynecology;  Laterality: N/A;  Requested 09/07/15 @ 2:15p  . Bilateral salpingectomy Bilateral 09/07/2015    Procedure: BILATERAL SALPINGECTOMY;  Surgeon: Mora Bellman, MD;  Location: [facility];  Service: Gynecology;  Laterality: Bilateral;   No family history on file. Social History  Substance Use Topics  . Smoking status: Current Every Day Smoker -- 0.25 packs/day for 15 years    Types: Cigarettes  . Smokeless tobacco: Never Used  . Alcohol Use: 3.6 oz/week    6 Cans of beer per week   ROS See pertinent in  HPI  Blood pressure 143/77, pulse 93, temperature 98.4 F (36.9 C), weight 176 lb 1.6 oz (79.878 kg). GENERAL: Well-developed, well-nourished female in no acute distress.  ABDOMEN: Soft, nontender, nondistended. No organomegaly. Incision: completely healed, no erythema, induration or drainage PELVIC: Normal external female genitalia. Vagina is pink and rugated.  Vaginal vault intact EXTREMITIES: No cyanosis, clubbing, or edema, 2+ distal pulses.  A/P 54 yo here for post op check - Patient is medically cleared to return to her regular activities - Rx Tramadol provided - patient advised to monitor her use of narcotics, as she has developed a high tolerance over the years. Informed the patient that she is at high risk of dependence given her history - RTC prn

## 2016-09-29 ENCOUNTER — Other Ambulatory Visit: Payer: Self-pay | Admitting: Gastroenterology

## 2016-10-04 ENCOUNTER — Encounter (HOSPITAL_COMMUNITY): Payer: Self-pay | Admitting: *Deleted

## 2016-10-04 NOTE — Progress Notes (Signed)
Spoke with pt for pre-op call. Pt denies cardiac history, chest pain or sob.  EKG - 08/31/15.

## 2016-10-05 ENCOUNTER — Ambulatory Visit (HOSPITAL_COMMUNITY)
Admission: RE | Admit: 2016-10-05 | Discharge: 2016-10-05 | Disposition: A | Discharge status: Home / Self Care | Payer: Self-pay | Source: Ambulatory Visit | Attending: Gastroenterology | Admitting: Gastroenterology

## 2016-10-05 ENCOUNTER — Ambulatory Visit (HOSPITAL_COMMUNITY): Payer: Self-pay | Admitting: Certified Registered Nurse Anesthetist

## 2016-10-05 ENCOUNTER — Encounter (HOSPITAL_COMMUNITY): Payer: Self-pay

## 2016-10-05 ENCOUNTER — Encounter (HOSPITAL_COMMUNITY)
Admission: RE | Disposition: A | Discharge status: Home / Self Care | Payer: Self-pay | Source: Ambulatory Visit | Attending: Gastroenterology

## 2016-10-05 DIAGNOSIS — F17200 Nicotine dependence, unspecified, uncomplicated: Secondary | ICD-10-CM | POA: Insufficient documentation

## 2016-10-05 DIAGNOSIS — I10 Essential (primary) hypertension: Secondary | ICD-10-CM | POA: Insufficient documentation

## 2016-10-05 DIAGNOSIS — K229 Disease of esophagus, unspecified: Secondary | ICD-10-CM | POA: Insufficient documentation

## 2016-10-05 DIAGNOSIS — R1013 Epigastric pain: Secondary | ICD-10-CM | POA: Insufficient documentation

## 2016-10-05 DIAGNOSIS — K219 Gastro-esophageal reflux disease without esophagitis: Secondary | ICD-10-CM | POA: Insufficient documentation

## 2016-10-05 DIAGNOSIS — R10816 Epigastric abdominal tenderness: Secondary | ICD-10-CM | POA: Diagnosis present

## 2016-10-05 HISTORY — DX: Anxiety disorder, unspecified: F41.9

## 2016-10-05 HISTORY — DX: Pneumonia, unspecified organism: J18.9

## 2016-10-05 HISTORY — PX: ESOPHAGOGASTRODUODENOSCOPY: SHX5428

## 2016-10-05 SURGERY — EGD (ESOPHAGOGASTRODUODENOSCOPY)
Anesthesia: Monitor Anesthesia Care

## 2016-10-05 MED ORDER — LACTATED RINGERS IV SOLN
INTRAVENOUS | Status: DC
  Administered 2016-10-05: 10:00:00 via INTRAVENOUS
  Administered 2016-10-05: 1000 mL via INTRAVENOUS

## 2016-10-05 MED ORDER — PROPOFOL 10 MG/ML IV BOLUS
INTRAVENOUS | Status: DC | PRN
  Administered 2016-10-05 (×3): 20 mg via INTRAVENOUS

## 2016-10-05 MED ORDER — BUTAMBEN-TETRACAINE-BENZOCAINE 2-2-14 % EX AERO
INHALATION_SPRAY | CUTANEOUS | Status: DC | PRN
  Administered 2016-10-05: 2 via TOPICAL

## 2016-10-05 MED ORDER — PROPOFOL 500 MG/50ML IV EMUL
INTRAVENOUS | Status: DC | PRN
  Administered 2016-10-05: 75 ug/kg/min via INTRAVENOUS

## 2016-10-05 MED ORDER — LIDOCAINE 2% (20 MG/ML) 5 ML SYRINGE
INTRAMUSCULAR | Status: DC | PRN
  Administered 2016-10-05: 40 mg via INTRAVENOUS

## 2016-10-05 MED ORDER — SODIUM CHLORIDE 0.9 % IV SOLN: INTRAVENOUS | Status: DC

## 2016-10-05 NOTE — Discharge Instructions (Signed)

## 2016-10-05 NOTE — Interval H&P Note (Signed)
History and Physical Interval Note:  10/05/2016 9:36 AM  Cynthia Welch  has presented today for surgery, with the diagnosis of Epigastric tenderness and Reflux  The various methods of treatment have been discussed with the patient and family. After consideration of risks, benefits and other options for treatment, the patient has consented to  Procedure(s): ESOPHAGOGASTRODUODENOSCOPY (EGD) (N/A) as a surgical intervention .  The patient's history has been reviewed, patient examined, no change in status, stable for surgery.  I have reviewed the patient's chart and labs.  Questions were answered to the patient's satisfaction.     [place] C.

## 2016-10-05 NOTE — Anesthesia Postprocedure Evaluation (Signed)
Anesthesia Post Note  Patient: Cynthia Welch  Procedure(s) Performed: Procedure(s) (LRB): ESOPHAGOGASTRODUODENOSCOPY (EGD) (N/A)  Patient location during evaluation: PACU Anesthesia Type: MAC Level of consciousness: awake and alert Pain management: pain level controlled Vital Signs Assessment: post-procedure vital signs reviewed and stable Respiratory status: spontaneous breathing, nonlabored ventilation, respiratory function stable and patient connected to nasal cannula oxygen Cardiovascular status: stable and blood pressure returned to baseline Anesthetic complications: no    Last Vitals:  Vitals:   10/05/16 1020 10/05/16 1030  BP: 108/78 (!) 129/92  Pulse: 77 75  Resp: (!) 21 (!) 22  Temp:      Last Pain:  Vitals:   10/05/16 1013  TempSrc: Oral  PainSc:                  Riccardo Dubin

## 2016-10-05 NOTE — Op Note (Signed)
[facility] Patient Name: Cynthia Welch Procedure Date : 10/05/2016 MRN: [id number] Attending MD: Lear Ng , MD Date of Birth: December 24, 1960 CSN: [id number] Age: 55 Admit Type: Outpatient Procedure:                Upper GI endoscopy Indications:              Epigastric abdominal pain, Esophageal reflux Providers:                Lear Ng, MD, Vista Lawman, RN, Despina Pole Tech, Technician, Pearline Cables, CRNA Referring MD:              Medicines:                Propofol per Anesthesia, Monitored Anesthesia Care Complications:            No immediate complications. Estimated Blood Loss:     Estimated blood loss was minimal. Procedure:                Pre-Anesthesia Assessment:                           - Prior to the procedure, a History and Physical                            was performed, and patient medications and                            allergies were reviewed. The patient's tolerance of                            previous anesthesia was also reviewed. The risks                            and benefits of the procedure and the sedation                            options and risks were discussed with the patient.                            All questions were answered, and informed consent                            was obtained. Prior Anticoagulants: The patient has                            taken no previous anticoagulant or antiplatelet                            agents. ASA Grade Assessment: II - A patient with                            mild systemic disease. After reviewing the risks  and benefits, the patient was deemed in                            satisfactory condition to undergo the procedure.                           After obtaining informed consent, the endoscope was                            passed under direct vision. Throughout the                            procedure, the  patient's blood pressure, pulse, and                            oxygen saturations were monitored continuously. The                            [id number]) scope was introduced through the                            mouth, and advanced to the second part of duodenum.                            The upper GI endoscopy was accomplished without                            difficulty. The patient tolerated the procedure                            well. Scope In: Scope Out: Findings:      A single 6 mm mucosal nodule was found in the distal esophagus. Biopsies       were taken with a cold forceps for histology. Estimated blood loss was       minimal.      The Z-line was regular and was found 38 cm from the incisors.      The exam of the esophagus was otherwise normal.      The entire examined stomach was normal.      The examined duodenum was normal. Impression:               - Mucosal nodule found in the esophagus. Biopsied.                           - Z-line regular, 38 cm from the incisors.                           - Normal stomach.                           - Normal examined duodenum. Moderate Sedation:      N/A - MAC procedure Recommendation:           - Await pathology results.                           -  Patient has a contact number available for                            emergencies. The signs and symptoms of potential                            delayed complications were discussed with the                            patient. Return to normal activities tomorrow.                            Written discharge instructions were provided to the                            patient.                           - Use Dexilant (dexlansoprazole) 60 mg PO daily.                           - Resume previous diet.                           - Post procedure medication orders were given. Procedure Code(s):        --- Professional ---                           [phone],  Esophagogastroduodenoscopy, flexible,                            transoral; with biopsy, single or multiple Diagnosis Code(s):        --- Professional ---                           K21.9, Gastro-esophageal reflux disease without                            esophagitis                           R10.13, Epigastric pain                           K22.8, Other specified diseases of esophagus CPT copyright 2016 [employer]. All rights reserved. The codes documented in this report are preliminary and upon coder review may  be revised to meet current compliance requirements. Lear Ng, MD 10/05/2016 10:14:31 AM This report has been signed electronically. Number of Addenda: 0

## 2016-10-05 NOTE — Anesthesia Preprocedure Evaluation (Signed)
Anesthesia Evaluation  Patient identified by MRN, date of birth, ID band Patient awake    Reviewed: Allergy & Precautions, H&P , Patient's Chart, lab work & pertinent test results, reviewed documented beta blocker date and time   Airway Mallampati: II  TM Distance: >3 FB Neck ROM: full    Dental no notable dental hx.    Pulmonary asthma , Current Smoker,    Pulmonary exam normal breath sounds clear to auscultation       Cardiovascular hypertension,  Rhythm:regular Rate:Normal     Neuro/Psych Seizures -,     GI/Hepatic GERD  ,  Endo/Other    Renal/GU      Musculoskeletal   Abdominal   Peds  Hematology   Anesthesia Other Findings   Reproductive/Obstetrics                             Anesthesia Physical Anesthesia Plan  ASA: II  Anesthesia Plan: MAC   Post-op Pain Management:    Induction: Intravenous  Airway Management Planned: Mask and Natural Airway  Additional Equipment:   Intra-op Plan:   Post-operative Plan:   Informed Consent: I have reviewed the patients History and Physical, chart, labs and discussed the procedure including the risks, benefits and alternatives for the proposed anesthesia with the patient or authorized representative who has indicated his/her understanding and acceptance.   Dental Advisory Given  Plan Discussed with: CRNA and Surgeon  Anesthesia Plan Comments: (Discussed sedation and potential to need to place airway or ETT if warranted by clinical changes intra-operatively. We will start procedure as MAC.)        Anesthesia Quick Evaluation

## 2016-10-05 NOTE — Anesthesia Procedure Notes (Signed)
Procedure Name: MAC Date/Time: 10/05/2016 9:53 AM Performed by: Everlean Cherry A Pre-anesthesia Checklist: Patient identified, Emergency Drugs available, Suction available and Patient being monitored Patient Re-evaluated:Patient Re-evaluated prior to inductionOxygen Delivery Method: Nasal cannula

## 2016-10-05 NOTE — Transfer of Care (Signed)
Immediate Anesthesia Transfer of Care Note  Patient: Cynthia Welch  Procedure(s) Performed: Procedure(s): ESOPHAGOGASTRODUODENOSCOPY (EGD) (N/A)  Patient Location: Endoscopy Unit  Anesthesia Type:MAC  Level of Consciousness: awake, alert , oriented and patient cooperative  Airway & Oxygen Therapy: Patient Spontanous Breathing and Patient connected to nasal cannula oxygen  Post-op Assessment: Report given to RN and Post -op Vital signs reviewed and stable  Post vital signs: Reviewed and stable  Last Vitals:  Vitals:   10/05/16 0815 10/05/16 1013  BP: (!) 143/90 96/71  Pulse: 83 90  Resp: 20 17    Last Pain:  Vitals:   10/05/16 1013  TempSrc: Oral  PainSc:          Complications: No apparent anesthesia complications

## 2016-10-05 NOTE — H&P (Signed)
Date of Initial H&P: 09/28/16  History reviewed, patient examined, no change in status, stable for surgery.

## 2016-10-09 ENCOUNTER — Encounter (HOSPITAL_COMMUNITY): Payer: Self-pay | Admitting: Gastroenterology

## 2017-04-29 ENCOUNTER — Emergency Department (HOSPITAL_COMMUNITY): Payer: Self-pay

## 2017-04-29 ENCOUNTER — Encounter (HOSPITAL_COMMUNITY): Payer: Self-pay

## 2017-04-29 ENCOUNTER — Inpatient Hospital Stay (HOSPITAL_COMMUNITY)
Admission: EM | Admit: 2017-04-29 | Discharge: 2017-05-01 | DRG: 100 | Disposition: A | Discharge status: Home / Self Care | Payer: Self-pay | Attending: Family Medicine | Admitting: Family Medicine

## 2017-04-29 DIAGNOSIS — E871 Hypo-osmolality and hyponatremia: Secondary | ICD-10-CM | POA: Diagnosis present

## 2017-04-29 DIAGNOSIS — F419 Anxiety disorder, unspecified: Secondary | ICD-10-CM | POA: Diagnosis present

## 2017-04-29 DIAGNOSIS — J45909 Unspecified asthma, uncomplicated: Secondary | ICD-10-CM | POA: Diagnosis present

## 2017-04-29 DIAGNOSIS — F1020 Alcohol dependence, uncomplicated: Secondary | ICD-10-CM | POA: Diagnosis present

## 2017-04-29 DIAGNOSIS — Y908 Blood alcohol level of 240 mg/100 ml or more: Secondary | ICD-10-CM | POA: Diagnosis present

## 2017-04-29 DIAGNOSIS — R4189 Other symptoms and signs involving cognitive functions and awareness: Secondary | ICD-10-CM

## 2017-04-29 DIAGNOSIS — F17210 Nicotine dependence, cigarettes, uncomplicated: Secondary | ICD-10-CM | POA: Diagnosis present

## 2017-04-29 DIAGNOSIS — I10 Essential (primary) hypertension: Secondary | ICD-10-CM | POA: Diagnosis present

## 2017-04-29 DIAGNOSIS — Z79899 Other long term (current) drug therapy: Secondary | ICD-10-CM

## 2017-04-29 DIAGNOSIS — I959 Hypotension, unspecified: Secondary | ICD-10-CM | POA: Diagnosis present

## 2017-04-29 DIAGNOSIS — K219 Gastro-esophageal reflux disease without esophagitis: Secondary | ICD-10-CM | POA: Diagnosis present

## 2017-04-29 DIAGNOSIS — E876 Hypokalemia: Secondary | ICD-10-CM | POA: Diagnosis present

## 2017-04-29 DIAGNOSIS — G40909 Epilepsy, unspecified, not intractable, without status epilepticus: Principal | ICD-10-CM

## 2017-04-29 DIAGNOSIS — R112 Nausea with vomiting, unspecified: Secondary | ICD-10-CM

## 2017-04-29 DIAGNOSIS — R55 Syncope and collapse: Secondary | ICD-10-CM

## 2017-04-29 DIAGNOSIS — Z6822 Body mass index (BMI) 22.0-22.9, adult: Secondary | ICD-10-CM

## 2017-04-29 DIAGNOSIS — E43 Unspecified severe protein-calorie malnutrition: Secondary | ICD-10-CM | POA: Insufficient documentation

## 2017-04-29 DIAGNOSIS — Z9114 Patient's other noncompliance with medication regimen: Secondary | ICD-10-CM

## 2017-04-29 LAB — COMPREHENSIVE METABOLIC PANEL
ALT: 37 U/L (ref 14–54)
AST: 98 U/L — AB (ref 15–41)
Albumin: 3.3 g/dL — ABNORMAL LOW (ref 3.5–5.0)
Alkaline Phosphatase: 164 U/L — ABNORMAL HIGH (ref 38–126)
Anion gap: 16 — ABNORMAL HIGH (ref 5–15)
BUN: <5 mg/dL — ABNORMAL LOW (ref 6–20)
CHLORIDE: 79 mmol/L — AB (ref 101–111)
CO2: 26 mmol/L (ref 22–32)
CREATININE: 0.83 mg/dL (ref 0.44–1.00)
Calcium: 8.7 mg/dL — ABNORMAL LOW (ref 8.9–10.3)
GFR calc Af Amer: >60 mL/min (ref >60)
GFR calc non Af Amer: >60 mL/min (ref >60)
Glucose, Bld: 100 mg/dL — ABNORMAL HIGH (ref 65–99)
Potassium: <2 mmol/L — CL (ref 3.5–5.1)
Sodium: 121 mmol/L — ABNORMAL LOW (ref 135–145)
Total Bilirubin: 0.9 mg/dL (ref 0.3–1.2)
Total Protein: 7.6 g/dL (ref 6.5–8.1)

## 2017-04-29 LAB — CBC
HEMATOCRIT: 36.1 % (ref 36.0–46.0)
Hemoglobin: 12.6 g/dL (ref 12.0–15.0)
MCH: 34.1 pg — ABNORMAL HIGH (ref 26.0–34.0)
MCHC: 34.9 g/dL (ref 30.0–36.0)
MCV: 97.6 fL (ref 78.0–100.0)
PLATELETS: 255 10*3/uL (ref 150–400)
RBC: 3.7 MIL/uL — AB (ref 3.87–5.11)
RDW: 12.2 % (ref 11.5–15.5)
WBC: 9.2 10*3/uL (ref 4.0–10.5)

## 2017-04-29 LAB — URINALYSIS, ROUTINE W REFLEX MICROSCOPIC
Bilirubin Urine: NEGATIVE
GLUCOSE, UA: NEGATIVE mg/dL
HGB URINE DIPSTICK: NEGATIVE
Ketones, ur: NEGATIVE mg/dL
Nitrite: NEGATIVE
PH: 8 (ref 5.0–8.0)
Protein, ur: NEGATIVE mg/dL
SPECIFIC GRAVITY, URINE: 1.003 — AB (ref 1.005–1.030)

## 2017-04-29 LAB — APTT: APTT: 26 s (ref 24–36)

## 2017-04-29 LAB — RAPID URINE DRUG SCREEN, HOSP PERFORMED
AMPHETAMINES: NOT DETECTED
BARBITURATES: NOT DETECTED
BENZODIAZEPINES: NOT DETECTED
Cocaine: NOT DETECTED
Opiates: NOT DETECTED
TETRAHYDROCANNABINOL: NOT DETECTED

## 2017-04-29 LAB — DIFFERENTIAL
BASOS ABS: 0 10*3/uL (ref 0.0–0.1)
BASOS PCT: 0 %
Eosinophils Absolute: 0.1 10*3/uL (ref 0.0–0.7)
Eosinophils Relative: 1 %
LYMPHS PCT: 31 %
Lymphs Abs: 2.8 10*3/uL (ref 0.7–4.0)
MONOS PCT: 7 %
Monocytes Absolute: 0.7 10*3/uL (ref 0.1–1.0)
NEUTROS ABS: 5.5 10*3/uL (ref 1.7–7.7)
Neutrophils Relative %: 61 %

## 2017-04-29 LAB — PROTIME-INR
INR: 0.97
Prothrombin Time: 12.9 seconds (ref 11.4–15.2)

## 2017-04-29 LAB — MAGNESIUM: MAGNESIUM: 1.8 mg/dL (ref 1.7–2.4)

## 2017-04-29 LAB — I-STAT TROPONIN, ED: Troponin i, poc: 0.01 ng/mL (ref 0.00–0.08)

## 2017-04-29 LAB — CBG MONITORING, ED: GLUCOSE-CAPILLARY: 103 mg/dL — AB (ref 65–99)

## 2017-04-29 LAB — SODIUM, URINE, RANDOM: SODIUM UR: 24 mmol/L

## 2017-04-29 LAB — TROPONIN I: Troponin I: <0.03 ng/mL (ref <0.03)

## 2017-04-29 LAB — ETHANOL: ALCOHOL ETHYL (B): 260 mg/dL — AB (ref <5)

## 2017-04-29 MED ORDER — HYDRALAZINE HCL 20 MG/ML IJ SOLN: 10.0000 mg | Freq: Four times a day (QID) | INTRAMUSCULAR | Status: DC | PRN

## 2017-04-29 MED ORDER — ADULT MULTIVITAMIN W/MINERALS CH
1.0000 | ORAL_TABLET | Freq: Every day | ORAL | Status: DC
  Administered 2017-04-30 – 2017-05-01 (×2): 1 via ORAL
  Filled 2017-04-29 (×2): qty 1

## 2017-04-29 MED ORDER — LEVETIRACETAM 500 MG/5ML IV SOLN: 1000.0000 mg | Freq: Two times a day (BID) | INTRAVENOUS | Status: DC

## 2017-04-29 MED ORDER — LACTATED RINGERS IV BOLUS (SEPSIS)
500.0000 mL | Freq: Once | INTRAVENOUS | Status: AC
  Administered 2017-04-29: 500 mL via INTRAVENOUS

## 2017-04-29 MED ORDER — FOLIC ACID 5 MG/ML IJ SOLN
1.0000 mg | Freq: Every day | INTRAMUSCULAR | Status: DC
  Administered 2017-04-30: 1 mg via INTRAVENOUS
  Filled 2017-04-29 (×2): qty 0.2

## 2017-04-29 MED ORDER — LORAZEPAM 2 MG/ML IJ SOLN: 2.0000 mg | INTRAMUSCULAR | Status: DC | PRN

## 2017-04-29 MED ORDER — POTASSIUM CHLORIDE 10 MEQ/100ML IV SOLN
10.0000 meq | INTRAVENOUS | Status: AC
  Administered 2017-04-29 (×4): 10 meq via INTRAVENOUS
  Filled 2017-04-29 (×4): qty 100

## 2017-04-29 MED ORDER — VITAMIN B-1 100 MG PO TABS
100.0000 mg | ORAL_TABLET | Freq: Every day | ORAL | Status: DC
  Administered 2017-04-30 – 2017-05-01 (×2): 100 mg via ORAL
  Filled 2017-04-29 (×2): qty 1

## 2017-04-29 MED ORDER — LACTATED RINGERS IV BOLUS (SEPSIS)
1000.0000 mL | Freq: Once | INTRAVENOUS | Status: AC
  Administered 2017-04-29: 1000 mL via INTRAVENOUS

## 2017-04-29 MED ORDER — SODIUM CHLORIDE 0.9 % IV SOLN
1000.0000 mg | Freq: Once | INTRAVENOUS | Status: AC
  Administered 2017-04-29: 1000 mg via INTRAVENOUS
  Filled 2017-04-29: qty 10

## 2017-04-29 MED ORDER — LORAZEPAM 2 MG/ML IJ SOLN
INTRAMUSCULAR | Status: AC
  Filled 2017-04-29: qty 1

## 2017-04-29 MED ORDER — SODIUM CHLORIDE 0.9 % IV SOLN
1000.0000 mg | Freq: Two times a day (BID) | INTRAVENOUS | Status: DC
  Administered 2017-04-30 – 2017-05-01 (×3): 1000 mg via INTRAVENOUS
  Filled 2017-04-29 (×3): qty 10

## 2017-04-29 MED ORDER — FOLIC ACID 1 MG PO TABS: 1.0000 mg | ORAL_TABLET | Freq: Every day | ORAL | Status: DC

## 2017-04-29 MED ORDER — LORAZEPAM 2 MG/ML IJ SOLN
1.0000 mg | Freq: Once | INTRAMUSCULAR | Status: AC
  Administered 2017-04-29: 1 mg via INTRAVENOUS

## 2017-04-29 MED ORDER — POTASSIUM CHLORIDE CRYS ER 20 MEQ PO TBCR
40.0000 meq | EXTENDED_RELEASE_TABLET | Freq: Once | ORAL | Status: AC
  Administered 2017-04-29: 20 meq via ORAL
  Filled 2017-04-29: qty 2

## 2017-04-29 MED ORDER — THIAMINE HCL 100 MG/ML IJ SOLN: 100.0000 mg | Freq: Every day | INTRAMUSCULAR | Status: DC

## 2017-04-29 NOTE — ED Provider Notes (Signed)
[facility] DEPT Provider Note   CSN: [id number] Arrival date & time: 04/29/17  1753   LEVEL 5 CAVEAT - ALTERED MENTAL STATUS  History   Chief Complaint Chief Complaint  Patient presents with  . Code Stroke    HPI Cynthia Welch is a 56 y.o. female.  HPI  66 -year-old female presents as a code stroke by EMS. The history is somewhat limited as the patient is a poor historian and does not seem to be at her mental baseline. The history from EMS is that the patient was last seen normal at 4:30 PM. Husband found her on the couch and she seemed unresponsive. EMS states she was totally unresponsive, even to pain. However she has woken up and slowly improved. She has had slurred speech. She has a history of seizures and the husband states this seems to be what she is like after a seizure. She has vomited. EMS also reports that they were told that she has drank a couple shots this afternoon. Husband reports she is noncompliant with her seizure meds.  The patient is a poor historian but tells me she's been vomiting for the last 3 days. She's also had poor appetite and poor eating for several weeks. Has had a headache for about one week although none currently. Endorses yes when asked about chest pain but cannot tell me for how long or if she currently has it. No diarrhea. She recently took magnesium citrate for constipation. No abdominal pain.  Past Medical History:  Diagnosis Date  . Anxiety    none recently  . Asthma    as a child  . Fibroids   . Headache    reduced in last year  . Hypertension   . Pneumonia   . PONV (postoperative nausea and vomiting)    in the past "used to get nauseous"  . Seizures ([place])    none for 6 months ago (as of 10/04/16)  . Subdural hematoma ([place]) 01/29/2013   Per MRI 01/2013    Patient Active Problem List   Diagnosis Date Noted  . Hyponatremia 04/29/2017  . Syncope 04/29/2017  . Nausea & vomiting 04/29/2017  . Abdominal tenderness, epigastric  10/05/2016  . Esophageal reflux 10/05/2016  . S/P TAH (total abdominal hysterectomy) 09/07/2015  . Hypokalemia 01/29/2013  . Subdural hematoma ([place]) 01/29/2013  . Seizure disorder ([place]) 08/10/2010  . DENTAL CARIES 06/16/2010  . GANGLION CYST, WRIST, LEFT 01/26/2009  . TOBACCO ABUSE 11/19/2008  . LEUKOCYTOPENIA UNSPECIFIED 12/13/2007  . SKIN RASH 12/13/2007  . LIVER FUNCTION TESTS, ABNORMAL 12/13/2007  . HYPERCHOLESTEROLEMIA 10/31/2007  . HYPERTENSION, BENIGN ESSENTIAL 10/30/2007  . FIBROIDS, UTERUS 09/26/2007    Past Surgical History:  Procedure Laterality Date  . ABDOMINAL HYSTERECTOMY N/A 09/07/2015   Procedure: HYSTERECTOMY ABDOMINAL, CYSTOTOMY WITH BLADDER REPAIR;  Surgeon: Mora Bellman, MD;  Location: [facility];  Service: Gynecology;  Laterality: N/A;  Requested 09/07/15 @ 2:15p  . BILATERAL SALPINGECTOMY Bilateral 09/07/2015   Procedure: BILATERAL SALPINGECTOMY;  Surgeon: Mora Bellman, MD;  Location: [facility];  Service: Gynecology;  Laterality: Bilateral;  . DIAGNOSTIC LAPAROSCOPY    . DILATION AND CURETTAGE OF UTERUS    . ESOPHAGOGASTRODUODENOSCOPY N/A 10/05/2016   Procedure: ESOPHAGOGASTRODUODENOSCOPY (EGD);  Surgeon: Wilford Corner, MD;  Location: [facility];  Service: Endoscopy;  Laterality: N/A;  . ORIF ULNAR FRACTURE  09/03/2011   Procedure: OPEN REDUCTION INTERNAL FIXATION (ORIF) ULNAR FRACTURE;  Surgeon: Meredith Pel;  Location: [place] [facility];  Service: Orthopedics;  Laterality: Right;  . ORIF  ULNAR FRACTURE  12/14/2011   Procedure: OPEN REDUCTION INTERNAL FIXATION (ORIF) ULNAR FRACTURE;  Surgeon: Meredith Pel, MD;  Location: [facility];  Service: Orthopedics;  Laterality: Right;  Right ulnar shaft open reduction, internal fixation with right iliac crest bone graft  . TONSILLECTOMY      OB History    No data available       Home Medications    Prior to Admission medications   Medication Sig Start Date End Date Taking? Authorizing Provider  Cholecalciferol  (VITAMIN D3) 2000 UNITS TABS Take 1 tablet by mouth daily.   Yes [provider]  levETIRAcetam (KEPPRA) 750 MG tablet Take 750 mg by mouth 2 (two) times daily.   Yes [provider]  metoprolol succinate (TOPROL-XL) 25 MG 24 hr tablet Take 25 mg by mouth daily.   Yes [provider]  omeprazole (PRILOSEC) 40 MG capsule Take 40 mg by mouth daily.   Yes [provider]  triamterene-hydrochlorothiazide (MAXZIDE-25) 37.5-25 MG tablet Take 1 tablet by mouth daily.   Yes [provider]  ibuprofen (ADVIL,MOTRIN) 600 MG tablet Take 1 tablet (600 mg total) by mouth every 6 (six) hours as needed for fever or headache. Patient not taking: Reported on 04/29/2017 09/09/15   Constant, Peggy, MD  oxyCODONE-acetaminophen (PERCOCET/ROXICET) 5-325 MG tablet Take 1-2 tablets by mouth every 6 (six) hours as needed for moderate pain or severe pain. Patient not taking: Reported on 10/13/2015 09/09/15   Constant, Peggy, MD  traMADol (ULTRAM) 50 MG tablet Take 1 tablet (50 mg total) by mouth every 6 (six) hours as needed. Patient not taking: Reported on 04/29/2017 10/13/15   Constant, Peggy, MD    Family History Family History  Problem Relation Age of Onset  . Cirrhosis Mother   . Diabetes Mellitus I Mother   . Esophageal cancer Father     Social History Social History  Substance Use Topics  . Smoking status: Current Every Day Smoker    Packs/day: 0.50    Years: 15.00    Types: Cigarettes  . Smokeless tobacco: Never Used  . Alcohol use 3.6 oz/week    6 Cans of beer per week     Allergies   Oxycodone hcl and Acetaminophen   Review of Systems Review of Systems  Unable to perform ROS: Mental status change     Physical Exam Updated Vital Signs BP 90/69   Pulse 79   Temp 97.8 F (36.6 C)   Resp 16   Wt 66.3 kg (146 lb 2.6 oz)   SpO2 97%   BMI 22.22 kg/m   Physical Exam  Constitutional: She appears well-developed and well-nourished.  HENT:    Head: Normocephalic and atraumatic.  Right Ear: External ear normal.  Left Ear: External ear normal.  Nose: Nose normal.  Eyes: EOM are normal. Pupils are equal, round, and reactive to light. Right eye exhibits no discharge. Left eye exhibits no discharge.  Neck: Neck supple.  Cardiovascular: Normal rate, regular rhythm and normal heart sounds.   Pulmonary/Chest: Effort normal and breath sounds normal.  Abdominal: Soft. She exhibits no distension. There is no tenderness.  Neurological: She is alert. She is disoriented.  CN 3-12 grossly intact. 5/5 strength in all 4 extremities. Grossly normal sensation. Finger to nose with mild tremor but gets to finger and nose   Skin: Skin is warm and dry.  Psychiatric: Her speech is slurred.  Nursing note and vitals reviewed.    ED Treatments / Results  Labs (  all labs ordered are listed, but only abnormal results are displayed) Labs Reviewed  ETHANOL - Abnormal; Notable for the following:       Result Value   Alcohol, Ethyl (B) 260 (*)    All other components within normal limits  CBC - Abnormal; Notable for the following:    RBC 3.70 (*)    MCH 34.1 (*)    All other components within normal limits  COMPREHENSIVE METABOLIC PANEL - Abnormal; Notable for the following:    Sodium 121 (*)    Potassium <2.0 (*)    Chloride 79 (*)    Glucose, Bld 100 (*)    BUN <5 (*)    Calcium 8.7 (*)    Albumin 3.3 (*)    AST 98 (*)    Alkaline Phosphatase 164 (*)    Anion gap 16 (*)    All other components within normal limits  URINALYSIS, ROUTINE W REFLEX MICROSCOPIC - Abnormal; Notable for the following:    APPearance HAZY (*)    Specific Gravity, Urine 1.003 (*)    Leukocytes, UA TRACE (*)    Bacteria, UA RARE (*)    Squamous Epithelial / LPF 0-5 (*)    All other components within normal limits  CBG MONITORING, ED - Abnormal; Notable for the following:    Glucose-Capillary 103 (*)    All other components within normal limits  PROTIME-INR   APTT  DIFFERENTIAL  RAPID URINE DRUG SCREEN, HOSP PERFORMED  MAGNESIUM  SODIUM, URINE, RANDOM  I-STAT CHEM 8, ED  I-STAT TROPOININ, ED    EKG  EKG Interpretation  Date/Time:  Sunday April 29 2017 18:18:31 EDT Ventricular Rate:  74 PR Interval:    QRS Duration: 110 QT Interval:  474 QTC Calculation: 526 R Axis:   72 Text Interpretation:  Sinus rhythm Borderline prolonged PR interval Low voltage with right axis deviation Prolonged QT interval U waves present Confirmed by Sherwood Gambler [phone]) on 04/29/2017 6:56:34 PM       Radiology Dg Chest Portable 1 View  Result Date: 04/29/2017 CLINICAL DATA:  Unresponsive EXAM: PORTABLE CHEST 1 VIEW COMPARISON:  09/02/2011 FINDINGS: The heart size and mediastinal contours are within normal limits. Both lungs are clear. The visualized skeletal structures are unremarkable. IMPRESSION: No active disease. Electronically Signed   By: Donavan Foil M.D.   On: 04/29/2017 20:12   Ct Head Code Stroke W/o Cm  Result Date: 04/29/2017 CLINICAL DATA:  Code stroke.  Unresponsive.  Slurred speech. EXAM: CT HEAD WITHOUT CONTRAST TECHNIQUE: Contiguous axial images were obtained from the base of the skull through the vertex without intravenous contrast. COMPARISON:  01/30/2013 CT head.  01/28/2013 MRI head. FINDINGS: Brain: No evidence for large acute infarct, focal mass effect, intracranial hemorrhage, hydrocephalus, or extra-axial collection. Mild chronic microvascular ischemic changes and parenchymal volume loss of the brain are stable. 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 Vascular: No hyperdense vessel identified. Calcific atherosclerosis of carotid siphons. Skull: Normal. Negative for fracture or focal lesion. Sinuses/Orbits: No acute finding. Other: None. ASPECTS [facility] Stroke Program Early CT Score) - Ganglionic level  infarction (caudate, lentiform nuclei, internal capsule, insula, M1-M3 cortex): 7 - Supraganglionic infarction (M4-M6 cortex): 3 Total score (0-10 with 10 being normal): 10 IMPRESSION: 1. No acute intracranial abnormality identified. 2. Interval resolution of bilateral small subdural collections. 3. Stable mild chronic microvascular ischemic changes and mild  parenchymal volume loss of the brain. 4. 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 5. ASPECTS is 10 These results were called by telephone at the time of interpretation on 04/29/2017 at 6:11 pm to Dr. Leonel Ramsay, who verbally acknowledged these results. Electronically Signed   By: Kristine Garbe M.D.   On: 04/29/2017 18:12    Procedures Procedures (including critical care time)  CRITICAL CARE Performed by: Sherwood Gambler T   Total critical care time: 30 minutes  Critical care time was exclusive of separately billable procedures and treating other patients.  Critical care was necessary to treat or prevent imminent or life-threatening deterioration.  Critical care was time spent personally by me on the following activities: development of treatment plan with patient and/or surrogate as well as nursing, discussions with consultants, evaluation of patient's response to treatment, examination of patient, obtaining history from patient or surrogate, ordering and performing treatments and interventions, ordering and review of laboratory studies, ordering and review of radiographic studies, pulse oximetry and re-evaluation of patient's condition.   Medications Ordered in ED Medications  potassium chloride 10 mEq in 100 mL IVPB (10 mEq Intravenous New Bag/Given 04/29/17 2110)  LORazepam (ATIVAN) 2 MG/ML injection (not administered)  LORazepam (ATIVAN) injection 2-3 mg (not administered)  folic acid injection 1 mg (not administered)  folic acid (FOLVITE)  tablet 1 mg (not administered)  multivitamin with minerals tablet 1 tablet (not administered)  thiamine (VITAMIN B-1) tablet 100 mg (not administered)  thiamine (B-1) injection 100 mg (not administered)  levETIRAcetam (KEPPRA) 1,000 mg in sodium chloride 0.9 % 100 mL IVPB (not administered)  lactated ringers bolus 1,000 mL (0 mLs Intravenous Stopped 04/29/17 1946)  potassium chloride SA (K-DUR,KLOR-CON) CR tablet 40 mEq (20 mEq Oral Given 04/29/17 1902)  LORazepam (ATIVAN) injection 1 mg (1 mg Intravenous Given 04/29/17 1915)  levETIRAcetam (KEPPRA) 1,000 mg in sodium chloride 0.9 % 100 mL IVPB (0 mg Intravenous Stopped 04/29/17 2005)  lactated ringers bolus 500 mL (0 mLs Intravenous Stopped 04/29/17 2041)     Initial Impression / Assessment and Plan / ED Course  I have reviewed the triage vital signs and the nursing notes.  Pertinent labs & imaging results that were available during my care of the patient were reviewed by me and considered in my medical decision making (see chart for details).     Her altered mental status appears to be more post ictal phase rather than seizure or infectious cause. She has returned to a normal baseline and has clear speech while being observed in the ED. However her labs are concerning with a sodium of 121 and potassium of less than 2. She also has a mildly prolonged QT and U waves. Magnesium is normal. She was given lactated Ringer's fluid boluses due to low blood pressures in the 90s. She appeared to start to go down to a possible seizure with staring straight ahead and some right arm twitching. However after Ativan that seemed to stop. She was loaded with Keppra after consultation with neurology. Unclear cause of her hyponatremia although it may be related to poor oral intake as with further questioning it seems that she has been not eating well for a couple months. She's been losing weight. Possible malignancy given these findings but no obvious 1 found. CT head  unremarkable. Chest x-ray without obvious lung mass. She will be admitted to the hospitalist for further workup and care.  Final Clinical Impressions(s) / ED Diagnoses   Final diagnoses:  Hyponatremia  Hypokalemia    New Prescriptions New Prescriptions   No medications on file     Sherwood Gambler, MD 04/29/17 2133

## 2017-04-29 NOTE — ED Notes (Signed)
Family at bedside. 

## 2017-04-29 NOTE — ED Notes (Signed)
RN administering PO potassium when pt right arm started to shake and pt had a blank stare into the ceiling and would not respond to verbal stimuli, Dr Regenia Skeeter called into room and pt became responsive again. 1 mg ativan ordered

## 2017-04-29 NOTE — ED Notes (Signed)
ED Provider at bedside. 

## 2017-04-29 NOTE — ED Notes (Signed)
Pt ambulatory to the restroom independently with 1 assist standby

## 2017-04-29 NOTE — H&P (Signed)
TRH H&P   Patient Demographics:    Cynthia Welch, is a 56 y.o. female  MRN: [id number]   DOB - 16-Jun-1961  Admit Date - 04/29/2017  Outpatient Primary MD for the patient is Rogers Blocker, MD  Referring MD/NP/PA: Dr. Regenia Skeeter  Outpatient Specialists: none, no neurologist  Patient coming from:  home  Chief Complaint  Patient presents with  . Code Stroke      HPI:    Cynthia Welch  is a 56 y.o. female, w seizure , Etoh use,  Presents with unresponsiveness.  Her husband found her down on the couch and called 911.  Pt admits to vodka use today with orange juice.  Pt states that she hasn't recently been taking keppra, but thinks she has been taking some other medication.  Pt arrived at [facility] and code stroke was called.  Neurology saw the patient and no TPA was given.    In ED, CT brain => no acute intracranial abnormality,  79mm hyperdense focus of the lower pituitary stalk stable from prior MR,  Pt was noted to have Na=121, and K <2.  Pt was given keppra iv in the ED.  Pt will be admitted for seizure , etoh dep, and severe hypokalemia, and hyponatremia.     Pt will be admitted for w/up of unresponsiveness, ? Seizure, and severe hypokalemia, hyponatremia.        Review of systems:    In addition to the HPI above,  No Fever-chills, No Headache, No changes with Vision or hearing, No problems swallowing food or Liquids, No Chest pain, Cough or Shortness of Breath, No Abdominal pain, +Nausea or Vommitting X 4 days, denies dysphagia.   Bowel movements are regular, No Blood in stool or Urine, No dysuria, No new skin rashes or bruises, No new joints pains-aches,  No new weakness, tingling, numbness in any extremity, No recent weight gain or loss, No polyuria, polydypsia or polyphagia, No significant Mental Stressors.  A full 10 point Review of Systems was done, except as  stated above, all other Review of Systems were negative.   With Past History of the following :    Past Medical History:  Diagnosis Date  . Anxiety    none recently  . Asthma    as a child  . Fibroids   . Headache    reduced in last year  . Hypertension   . Pneumonia   . PONV (postoperative nausea and vomiting)    in the past "used to get nauseous"  . Seizures ([place])    none for 6 months ago (as of 10/04/16)  . Subdural hematoma ([place]) 01/29/2013   Per MRI 01/2013      Past Surgical History:  Procedure Laterality Date  . ABDOMINAL HYSTERECTOMY N/A 09/07/2015   Procedure: HYSTERECTOMY ABDOMINAL, CYSTOTOMY WITH BLADDER REPAIR;  Surgeon: Mora Bellman, MD;  Location: [facility];  Service: Gynecology;  Laterality: N/A;  Requested 09/07/15 @ 2:15p  . BILATERAL SALPINGECTOMY Bilateral 09/07/2015   Procedure: BILATERAL SALPINGECTOMY;  Surgeon: Mora Bellman, MD;  Location: [facility];  Service: Gynecology;  Laterality: Bilateral;  . DIAGNOSTIC LAPAROSCOPY    . DILATION AND CURETTAGE OF UTERUS    . ESOPHAGOGASTRODUODENOSCOPY N/A 10/05/2016   Procedure: ESOPHAGOGASTRODUODENOSCOPY (EGD);  Surgeon: Wilford Corner, MD;  Location: [facility];  Service: Endoscopy;  Laterality: N/A;  . ORIF ULNAR FRACTURE  09/03/2011   Procedure: OPEN REDUCTION INTERNAL FIXATION (ORIF) ULNAR FRACTURE;  Surgeon: Meredith Pel;  Location: [place];  Service: Orthopedics;  Laterality: Right;  . ORIF ULNAR FRACTURE  12/14/2011   Procedure: OPEN REDUCTION INTERNAL FIXATION (ORIF) ULNAR FRACTURE;  Surgeon: Meredith Pel, MD;  Location: [place];  Service: Orthopedics;  Laterality: Right;  Right ulnar shaft open reduction, internal fixation with right iliac crest bone graft  . TONSILLECTOMY        Social History:     Social History  Substance Use Topics  . Smoking status: Current Every Day Smoker    Packs/day: 0.50    Years: 15.00    Types: Cigarettes  . Smokeless tobacco: Never Used  . Alcohol use 3.6  oz/week    6 Cans of beer per week     Lives - at home  Mobility - walks by self   Family History :     Family History  Problem Relation Age of Onset  . Cirrhosis Mother   . Diabetes Mellitus I Mother   . Esophageal cancer Father       Home Medications:   Prior to Admission medications   Medication Sig Start Date End Date Taking? Authorizing Provider  Cholecalciferol (VITAMIN D3) 2000 UNITS TABS Take 1 tablet by mouth daily.   Yes [provider]  levETIRAcetam (KEPPRA) 750 MG tablet Take 750 mg by mouth 2 (two) times daily.   Yes [provider]  metoprolol succinate (TOPROL-XL) 25 MG 24 hr tablet Take 25 mg by mouth daily.   Yes [provider]  omeprazole (PRILOSEC) 40 MG capsule Take 40 mg by mouth daily.   Yes [provider]  triamterene-hydrochlorothiazide (MAXZIDE-25) 37.5-25 MG tablet Take 1 tablet by mouth daily.   Yes [provider]  ibuprofen (ADVIL,MOTRIN) 600 MG tablet Take 1 tablet (600 mg total) by mouth every 6 (six) hours as needed for fever or headache. Patient not taking: Reported on 04/29/2017 09/09/15   Constant, Peggy, MD  oxyCODONE-acetaminophen (PERCOCET/ROXICET) 5-325 MG tablet Take 1-2 tablets by mouth every 6 (six) hours as needed for moderate pain or severe pain. Patient not taking: Reported on 10/13/2015 09/09/15   Constant, Peggy, MD  traMADol (ULTRAM) 50 MG tablet Take 1 tablet (50 mg total) by mouth every 6 (six) hours as needed. Patient not taking: Reported on 04/29/2017 10/13/15   Constant, Vickii Chafe, MD     Allergies:     Allergies  Allergen Reactions  . Oxycodone Hcl Itching  . Acetaminophen Itching     Physical Exam:   Vitals  Blood pressure 90/69, pulse 79, temperature 97.8 F (36.6 C), resp. rate 16, weight 66.3 kg (146 lb 2.6 oz), SpO2 97 %.   1. General  lying in bed in NAD,    2. Normal affect and insight, Not Suicidal or Homicidal, Awake Alert, Oriented X 3.  3. No F.N deficits,  ALL C.Nerves Intact, Strength 5/5 all 4 extremities, Sensation intact all 4 extremities, Plantars down going.  4. Ears and Eyes appear Normal, Conjunctivae clear, PERRLA. Moist Oral Mucosa.  5. Supple Neck, No JVD, No cervical lymphadenopathy appriciated, No Carotid Bruits.  6. Symmetrical Chest wall movement, Good air movement bilaterally, CTAB.  7. RRR, No Gallops, Rubs or Murmurs, No Parasternal Heave.  8. Positive Bowel Sounds, Abdomen Soft, No tenderness, No organomegaly appriciated,No rebound -guarding or rigidity.  9.  No Cyanosis, Normal Skin Turgor, No Skin Rash or Bruise.  10. Good muscle tone,  joints appear normal , no effusions, Normal ROM.  11. No Palpable Lymph Nodes in Neck or Axillae  Wears reading glasses    Data Review:    CBC  Recent Labs Lab 04/29/17 1757  WBC 9.2  HGB 12.6  HCT 36.1  PLT 255  MCV 97.6  MCH 34.1*  MCHC 34.9  RDW 12.2  LYMPHSABS 2.8  MONOABS 0.7  EOSABS 0.1  BASOSABS 0.0   ------------------------------------------------------------------------------------------------------------------  Chemistries   Recent Labs Lab 04/29/17 1757  NA 121*  K <2.0*  CL 79*  CO2 26  GLUCOSE 100*  BUN <5*  CREATININE 0.83  CALCIUM 8.7*  MG 1.8  AST 98*  ALT 37  ALKPHOS 164*  BILITOT 0.9   ------------------------------------------------------------------------------------------------------------------ CrCl cannot be calculated (Unknown ideal weight.). ------------------------------------------------------------------------------------------------------------------ No results for input(s): TSH, T4TOTAL, T3FREE, THYROIDAB in the last 72 hours.  Invalid input(s): FREET3  Coagulation profile  Recent Labs Lab 04/29/17 1757  INR 0.97   ------------------------------------------------------------------------------------------------------------------- No results for input(s): DDIMER in the last 72  hours. -------------------------------------------------------------------------------------------------------------------  Cardiac Enzymes No results for input(s): CKMB, TROPONINI, MYOGLOBIN in the last 168 hours.  Invalid input(s): CK ------------------------------------------------------------------------------------------------------------------ No results found for: BNP   ---------------------------------------------------------------------------------------------------------------  Urinalysis    Component Value Date/Time   COLORURINE YELLOW 04/29/2017 1826   APPEARANCEUR HAZY (A) 04/29/2017 1826   LABSPEC 1.003 (L) 04/29/2017 1826   PHURINE 8.0 04/29/2017 1826   GLUCOSEU NEGATIVE 04/29/2017 1826   HGBUR NEGATIVE 04/29/2017 1826   HGBUR negative 08/26/2010 1059   BILIRUBINUR NEGATIVE 04/29/2017 1826   KETONESUR NEGATIVE 04/29/2017 1826   PROTEINUR NEGATIVE 04/29/2017 1826   UROBILINOGEN 0.2 05/18/2015 2104   NITRITE NEGATIVE 04/29/2017 1826   LEUKOCYTESUR TRACE (A) 04/29/2017 1826    ----------------------------------------------------------------------------------------------------------------   Imaging Results:    Dg Chest Portable 1 View  Result Date: 04/29/2017 CLINICAL DATA:  Unresponsive EXAM: PORTABLE CHEST 1 VIEW COMPARISON:  09/02/2011 FINDINGS: The heart size and mediastinal contours are within normal limits. Both lungs are clear. The visualized skeletal structures are unremarkable. IMPRESSION: No active disease. Electronically Signed   By: Donavan Foil M.D.   On: 04/29/2017 20:12   Ct Head Code Stroke W/o Cm  Result Date: 04/29/2017 CLINICAL DATA:  Code stroke.  Unresponsive.  Slurred speech. EXAM: CT HEAD WITHOUT CONTRAST TECHNIQUE: Contiguous axial images were obtained from the base of the skull through the vertex without intravenous contrast. COMPARISON:  01/30/2013 CT head.  01/28/2013 MRI head. FINDINGS: Brain: No evidence for large acute infarct, focal  mass effect, intracranial hemorrhage, hydrocephalus, or extra-axial collection. Mild chronic microvascular ischemic changes and parenchymal volume loss of the brain are stable. 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 Vascular: No hyperdense vessel identified. Calcific atherosclerosis of carotid siphons. Skull: Normal. Negative for fracture or focal lesion. Sinuses/Orbits: No acute finding. Other: None. ASPECTS [facility] Stroke Program Early CT Score) - Ganglionic level infarction (caudate, lentiform nuclei,  internal capsule, insula, M1-M3 cortex): 7 - Supraganglionic infarction (M4-M6 cortex): 3 Total score (0-10 with 10 being normal): 10 IMPRESSION: 1. No acute intracranial abnormality identified. 2. Interval resolution of bilateral small subdural collections. 3. Stable mild chronic microvascular ischemic changes and mild parenchymal volume loss of the brain. 4. 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 5. ASPECTS is 10 These results were called by telephone at the time of interpretation on 04/29/2017 at 6:11 pm to Dr. Leonel Ramsay, who verbally acknowledged these results. Electronically Signed   By: Kristine Garbe M.D.   On: 04/29/2017 18:12       Assessment & Plan:    Active Problems:   HYPERTENSION, BENIGN ESSENTIAL   Seizure disorder (HCC)   Hypokalemia   Esophageal reflux   Hyponatremia   Syncope   Nausea & vomiting    Unresponsive ? Syncope.  Possibly seizure vs Etoh use (ETOH level =260) Tele Trop I q6h x3 Check carotid ultrasound Check cardiac echo Defer to neurology regarding EEG /MRI  Appreciate neurology input  Seizure do Check cpk, check prolactin Keppra 1gm iv bid  Hypokalemia Check magnesium Kcl 67meq po x1 in ED,  Kcl 28meq iv x 4  In  ED  Hyponatremia Check serum osm, cortisol, tsh, urine osm, urine sodium  STOP triamterene/hydrochlorothiazide Ns with 40 meq at 29mL per hour   Nausea and vomitting zofran prn  protonix 40mg  po qday  Hypertension Continue metoprolol Hydralazine 10mg  iv q6h prn sbp >160  ETOH use CIWA  DVT Prophylaxis  Lovenox - SCDs  AM Labs Ordered, also please review Full Orders  Family Communication: Admission, patients condition and plan of care including tests being ordered have been discussed with the patient  who indicate understanding and agree with the plan and Code Status.  Code Status FULL CODE  Likely DC to  home  Condition GUARDED    Consults called:  neurology    Admission status: inpatient   Time spent in minutes : 45 minutes   Jani Gravel M.D on 04/29/2017 at 9:37 PM  Between 7am to 7pm - Pager - [phone]. After 7pm go to [email or web address] - password [facility]  [facility] - Office  [phone]

## 2017-04-29 NOTE — ED Triage Notes (Signed)
Pt from home with ems, husband found pt slumped over on couch, upon ems arrival pt was unresponsive, not even to pain. When pt became responsive she had some slurred speech but no other neuro deficits noted. Pt was "taking shots" of alcohol prior to episode. LSN 1630 today, pt has hx of seizures but ems reports she is non compliant with medications. Husband believes she had a seizure but not witnessed. Pt alert and oriented upon arrival. BP 110/84, 98% on room air, HR 76 sinus. nad at this time

## 2017-04-29 NOTE — ED Notes (Signed)
Date and time results received: 04/29/17 1835 (use smartphrase ".now" to insert current time)  Test: potassium Critical Value: <2  Name of Provider Notified: Dr.Goldston  Orders Received? Or Actions Taken?:pending orders

## 2017-04-29 NOTE — Consult Note (Signed)
Neurology Consultation Reason for Consult: unresponsive Referring Physician: Tyron Russell  CC: unresponsiveness  History is obtained from:Patient  HPI: Cynthia Welch is a 56 y.o. female with history of seizures who presents with unresponsiveness. Her husband found her down on the couch and called 911. On arrival, EMS found her to be unresponsive but she began markedly improving en route.   By the time of her arrival to [facility] she was still mildly confused, but greatly improved. She is not sure what seizure medicine that she is supposed to be taking, but according to her husband she has apparently not been taking it.  She was on Keppra in the past, but had behavioral side effects and therefore was discontinued.  LKW: 5 pm tpa given?: no, rapidly improving symptoms  ROS: A 14 point ROS was performed and is negative except as noted in the HPI.   Past Medical History:  Diagnosis Date  . Anxiety    none recently  . Asthma    as a child  . Fibroids   . Headache    reduced in last year  . Hypertension   . Pneumonia   . PONV (postoperative nausea and vomiting)    in the past "used to get nauseous"  . Seizures ([place])    none for 6 months ago (as of 10/04/16)  . Subdural hematoma ([place]) 01/29/2013   Per MRI 01/2013     Family History  Problem Relation Age of Onset  . Cirrhosis Mother   . Diabetes Mellitus I Mother   . Esophageal cancer Father      Social History:  reports that she has been smoking Cigarettes.  She has a 7.50 pack-year smoking history. She has never used smokeless tobacco. She reports that she drinks about 3.6 oz of alcohol per week . She reports that she does not use drugs.   Exam: Current vital signs: BP 99/80 (BP Location: Right Arm)   Pulse 74   Temp 97.8 F (36.6 C) (Oral)   Resp 18   Wt 66.3 kg (146 lb 2.6 oz)   SpO2 98%   BMI 22.22 kg/m  Vital signs in last 24 hours: Temp:  [97.8 F (36.6 C)] 97.8 F (36.6 C) (07/08 1806) Pulse Rate:  [74] 74  (07/08 1806) Resp:  [18] 18 (07/08 1806) BP: (99)/(80) 99/80 (07/08 1806) SpO2:  [98 %-100 %] 98 % (07/08 1807) Weight:  [66.3 kg (146 lb 2.6 oz)] 66.3 kg (146 lb 2.6 oz) (07/08 1809)   Physical Exam  Constitutional: Appears well-developed and well-nourished.  Psych: Affect appropriate to situation Eyes: No scleral injection HENT: No OP obstrucion Head: Normocephalic.  Cardiovascular: Normal rate and regular rhythm.  Respiratory: Effort normal and breath sounds normal to anterior ascultation GI: Soft.  No distension. There is no tenderness.  Skin: WDI  Neuro: Mental Status: Patient is somnolent but easily rousable. She is oriented to person, place, month, age No signs of aphasia or neglect She appears intoxicated Cranial Nerves: II: Visual Fields are full. Pupils are equal, round, and reactive to light.   III,IV, VI: EOMI without ptosis or diploplia.  V: Facial sensation is symmetric to temperature VII: Facial movement is symmetric.  VIII: hearing is intact to voice X: Uvula elevates symmetrically XI: Shoulder shrug is symmetric. XII: tongue is midline without atrophy or fasciculations.  Motor: Tone is normal. Bulk is normal. 5/5 strength was present in all four extremities.  Sensory: Sensation is symmetric to light touch and temperature in the arms  and legs. Cerebellar: She has mild difficulty with finger nose finger bilaterally  I have reviewed labs in epic and the results pertinent to this consultation are: Sodium 121 Potassium less than 2 Ethanol 260  I have reviewed the images obtained: CT head-unremarkable  Impression: 56 year old female with an episode of unresponsiveness. Possibilities include cardiac syncope, postictal state, alcohol-related unresponsiveness (though I think less likely given that she is improving so rapidly).  Her sodium 121 is not typically enough to induce seizures, but could lower seizure threshold in someone already predisposed. Her  magnesium is pending, and this will need to be addressed if she has hypomagnesemic.   Recommendations: 1) Load with keppra 1g, followed by 1G BID 2) Evaluation of electrolyte abnormalities/correction per internal medicine.  3) MRI if she does not return to baseline, though I think current findings(drowsiness, mild confusion) are explained by EtOH level.    Roland Rack, MD [facility] [phone]  If 7pm- 7am, please page neurology on call as listed in [place].

## 2017-04-30 ENCOUNTER — Inpatient Hospital Stay (HOSPITAL_COMMUNITY): Payer: Self-pay

## 2017-04-30 ENCOUNTER — Encounter (HOSPITAL_COMMUNITY): Payer: Self-pay

## 2017-04-30 DIAGNOSIS — I5030 Unspecified diastolic (congestive) heart failure: Secondary | ICD-10-CM

## 2017-04-30 DIAGNOSIS — R55 Syncope and collapse: Secondary | ICD-10-CM

## 2017-04-30 LAB — COMPREHENSIVE METABOLIC PANEL
ALBUMIN: 3 g/dL — AB (ref 3.5–5.0)
ALK PHOS: 153 U/L — AB (ref 38–126)
ALT: 33 U/L (ref 14–54)
ANION GAP: 11 (ref 5–15)
AST: 81 U/L — ABNORMAL HIGH (ref 15–41)
BILIRUBIN TOTAL: 0.9 mg/dL (ref 0.3–1.2)
BUN: <5 mg/dL — ABNORMAL LOW (ref 6–20)
CALCIUM: 8.4 mg/dL — AB (ref 8.9–10.3)
CO2: 26 mmol/L (ref 22–32)
CREATININE: 0.68 mg/dL (ref 0.44–1.00)
Chloride: 92 mmol/L — ABNORMAL LOW (ref 101–111)
GFR calc Af Amer: >60 mL/min (ref >60)
GFR calc non Af Amer: >60 mL/min (ref >60)
GLUCOSE: 90 mg/dL (ref 65–99)
Potassium: 2.2 mmol/L — CL (ref 3.5–5.1)
Sodium: 129 mmol/L — ABNORMAL LOW (ref 135–145)
TOTAL PROTEIN: 7.2 g/dL (ref 6.5–8.1)

## 2017-04-30 LAB — CBC
HCT: 36.4 % (ref 36.0–46.0)
Hemoglobin: 13.3 g/dL (ref 12.0–15.0)
MCH: 35.8 pg — AB (ref 26.0–34.0)
MCHC: 36.5 g/dL — AB (ref 30.0–36.0)
MCV: 98.1 fL (ref 78.0–100.0)
PLATELETS: 261 10*3/uL (ref 150–400)
RBC: 3.71 MIL/uL — ABNORMAL LOW (ref 3.87–5.11)
RDW: 12 % (ref 11.5–15.5)
WBC: 7.1 10*3/uL (ref 4.0–10.5)

## 2017-04-30 LAB — SODIUM, URINE, RANDOM: Sodium, Ur: 45 mmol/L

## 2017-04-30 LAB — VAS US CAROTID
LCCADDIAS: 20 cm/s
LCCADSYS: 73 cm/s
LEFT ECA DIAS: -17 cm/s
LEFT VERTEBRAL DIAS: -13 cm/s
LICADDIAS: -31 cm/s
LICADSYS: -66 cm/s
LICAPSYS: -38 cm/s
Left CCA prox dias: 17 cm/s
Left CCA prox sys: 78 cm/s
Left ICA prox dias: -15 cm/s
RCCAPSYS: 79 cm/s
RIGHT ECA DIAS: -14 cm/s
RIGHT VERTEBRAL DIAS: 24 cm/s
Right CCA prox dias: 18 cm/s
Right cca dist sys: -57 cm/s

## 2017-04-30 LAB — CK TOTAL AND CKMB (NOT AT ARMC)
CK, MB: 3.6 ng/mL (ref 0.5–5.0)
RELATIVE INDEX: INVALID (ref 0.0–2.5)
Total CK: 53 U/L (ref 38–234)

## 2017-04-30 LAB — ECHOCARDIOGRAM COMPLETE
HEIGHTINCHES: 68 in
WEIGHTICAEL: 2345.69 [oz_av]

## 2017-04-30 LAB — BASIC METABOLIC PANEL
Anion gap: 10 (ref 5–15)
CALCIUM: 7.9 mg/dL — AB (ref 8.9–10.3)
CO2: 25 mmol/L (ref 22–32)
CREATININE: 0.82 mg/dL (ref 0.44–1.00)
Chloride: 92 mmol/L — ABNORMAL LOW (ref 101–111)
GFR calc Af Amer: >60 mL/min (ref >60)
GLUCOSE: 134 mg/dL — AB (ref 65–99)
POTASSIUM: 2.6 mmol/L — AB (ref 3.5–5.1)
SODIUM: 127 mmol/L — AB (ref 135–145)

## 2017-04-30 LAB — TROPONIN I
Troponin I: <0.03 ng/mL (ref <0.03)
Troponin I: <0.03 ng/mL (ref <0.03)

## 2017-04-30 LAB — OSMOLALITY: OSMOLALITY: 306 mosm/kg — AB (ref 275–295)

## 2017-04-30 LAB — MRSA PCR SCREENING: MRSA BY PCR: NEGATIVE

## 2017-04-30 LAB — HIV ANTIBODY (ROUTINE TESTING W REFLEX): HIV Screen 4th Generation wRfx: NONREACTIVE

## 2017-04-30 LAB — TSH: TSH: 2.352 u[IU]/mL (ref 0.350–4.500)

## 2017-04-30 LAB — MAGNESIUM: Magnesium: 1.8 mg/dL (ref 1.7–2.4)

## 2017-04-30 LAB — OSMOLALITY, URINE: Osmolality, Ur: 198 mOsm/kg — ABNORMAL LOW (ref 300–900)

## 2017-04-30 LAB — CORTISOL: Cortisol, Plasma: 17.7 ug/dL

## 2017-04-30 MED ORDER — MAGNESIUM SULFATE 2 GM/50ML IV SOLN
2.0000 g | Freq: Once | INTRAVENOUS | Status: AC
  Administered 2017-04-30: 2 g via INTRAVENOUS
  Filled 2017-04-30: qty 50

## 2017-04-30 MED ORDER — VITAMIN D 1000 UNITS PO TABS
2000.0000 [IU] | ORAL_TABLET | Freq: Every day | ORAL | Status: DC
  Administered 2017-04-30 – 2017-05-01 (×2): 2000 [IU] via ORAL
  Filled 2017-04-30 (×2): qty 2

## 2017-04-30 MED ORDER — ENOXAPARIN SODIUM 40 MG/0.4ML ~~LOC~~ SOLN
40.0000 mg | SUBCUTANEOUS | Status: DC
  Administered 2017-04-30 – 2017-05-01 (×2): 40 mg via SUBCUTANEOUS
  Filled 2017-04-30 (×2): qty 0.4

## 2017-04-30 MED ORDER — METOPROLOL SUCCINATE ER 25 MG PO TB24
25.0000 mg | ORAL_TABLET | Freq: Every day | ORAL | Status: DC
  Filled 2017-04-30: qty 1

## 2017-04-30 MED ORDER — METOPROLOL SUCCINATE 12.5 MG HALF TABLET
12.5000 mg | ORAL_TABLET | Freq: Every day | ORAL | Status: DC
  Administered 2017-04-30: 12.5 mg via ORAL
  Filled 2017-04-30: qty 1

## 2017-04-30 MED ORDER — PANTOPRAZOLE SODIUM 40 MG PO TBEC
40.0000 mg | DELAYED_RELEASE_TABLET | Freq: Every day | ORAL | Status: DC
Start: 2017-04-30 — End: 2017-05-01
  Administered 2017-04-30 – 2017-05-01 (×2): 40 mg via ORAL
  Filled 2017-04-30 (×2): qty 1

## 2017-04-30 MED ORDER — K PHOS MONO-SOD PHOS DI & MONO 155-852-130 MG PO TABS: 500.0000 mg | ORAL_TABLET | Freq: Three times a day (TID) | ORAL | Status: DC

## 2017-04-30 MED ORDER — POTASSIUM PHOSPHATES 15 MMOLE/5ML IV SOLN
30.0000 meq | Freq: Once | INTRAVENOUS | Status: AC
  Administered 2017-04-30: 30 meq via INTRAVENOUS
  Filled 2017-04-30: qty 6.82

## 2017-04-30 MED ORDER — POTASSIUM CHLORIDE 10 MEQ/100ML IV SOLN
10.0000 meq | INTRAVENOUS | Status: AC
  Administered 2017-04-30 (×4): 10 meq via INTRAVENOUS
  Filled 2017-04-30 (×4): qty 100

## 2017-04-30 MED ORDER — SODIUM CHLORIDE 0.9 % IV BOLUS (SEPSIS): 250.0000 mL | INTRAVENOUS | Status: DC | PRN

## 2017-04-30 MED ORDER — ENSURE ENLIVE PO LIQD: 237.0000 mL | Freq: Two times a day (BID) | ORAL | Status: DC

## 2017-04-30 MED ORDER — POTASSIUM CHLORIDE IN NACL 40-0.9 MEQ/L-% IV SOLN
INTRAVENOUS | Status: AC
  Administered 2017-04-30: 75 mL/h via INTRAVENOUS
  Administered 2017-04-30 – 2017-05-01 (×2): 100 mL/h via INTRAVENOUS
  Filled 2017-04-30 (×5): qty 1000

## 2017-04-30 MED ORDER — SODIUM CHLORIDE 0.9% FLUSH
3.0000 mL | Freq: Two times a day (BID) | INTRAVENOUS | Status: DC
  Administered 2017-04-30 (×2): 3 mL via INTRAVENOUS

## 2017-04-30 MED ORDER — BOOST / RESOURCE BREEZE PO LIQD
1.0000 | Freq: Three times a day (TID) | ORAL | Status: DC
  Administered 2017-04-30 – 2017-05-01 (×2): 1 via ORAL

## 2017-04-30 NOTE — Progress Notes (Signed)
  Echocardiogram 2D Echocardiogram has been performed.  Kynslee Baham 04/30/2017, 11:24 AM

## 2017-04-30 NOTE — Progress Notes (Signed)
PROGRESS NOTE   Dafina Suk  YWV:[id number]  DOB: [birth year]  DOA: 04/29/2017 PCP: Rogers Blocker, MD  Brief Admission Hx: Cynthia Welch  is a 56 y.o. female, w seizure , Etoh use,  Presents with unresponsiveness.  Her husband found her down on the couch and called 911.  Pt admits to vodka use today with orange juice.  Pt states that she hasn't recently been taking keppra, but thinks she has been taking some other medication.  Pt arrived at [facility] and code stroke was called.  Neurology saw the patient and no TPA was given.    MDM/Assessment & Plan:   Unresponsive ? Syncope.  suspect this was secondary to seizure vs binge ETOH use (ETOH level =260) Trop I q6h x3 negative Check carotid ultrasound pending Check cardiac echo Neurology consultation  Pt is back to her baseline mental status now per husband  Seizure Disorder with suspected noncompliance with medications Keppra 1gm iv bid Seizure precautions  Hypokalemia - severe  Replace magnesium IV Continue to supplement potassium, magnesium, phosphorus Discontinued diuretic  Hypotension with good MAP - asymptomatic, treating with IVF hydration, follow in SDU for now  Hyponatremia Suspect secondary to home thiazide diuretic STOP triamterene/hydrochlorothiazide Treating with NS, following  Nausea and vomitting zofran prn  protonix 40mg  po qday  Hypertension Continue metoprolol Hydralazine 10mg  iv q6h prn sbp >160  ETOH use CIWA  DVT Prophylaxis  Lovenox - SCDs  AM Labs Ordered, also please review Full Orders  Family Communication: Admission, patients condition and plan of care including tests being ordered have been discussed with the patient  who indicate understanding and agree with the plan and Code Status.  Code Status FULL CODE  Likely DC to  home  Condition GUARDED    Consults called:  neurology    Admission status: inpatient   Subjective: No further seizure  reported  Objective: Vitals:   04/30/17 0300 04/30/17 0339 04/30/17 0400 04/30/17 0537  BP: (!) 80/56 (!) 78/57 (!) 81/59 (!) 82/64  Pulse: 80 83 84 93  Resp: 20 18 16  (!) 21  Temp:  98.1 F (36.7 C)    TempSrc:  Oral    SpO2: 100% 100% 99% 100%  Weight:      Height:        Intake/Output Summary (Last 24 hours) at 04/30/17 0814 Last data filed at 04/30/17 0737  Gross per 24 hour  Intake          2606.25 ml  Output             1150 ml  Net          1456.25 ml   Filed Weights   04/29/17 1809 04/30/17 0112  Weight: 66.3 kg (146 lb 2.6 oz) 66.5 kg (146 lb 9.7 oz)   REVIEW OF SYSTEMS  As per history otherwise all reviewed and reported negative  Exam:  General exam: awake, lying in bed, NAD.  Respiratory system: Clear. No increased work of breathing. Cardiovascular system: S1 & S2 heard, RRR. No JVD, murmurs, gallops, clicks or pedal edema. Gastrointestinal system: Abdomen is nondistended, soft and nontender. Normal bowel sounds heard. Central nervous system: Alert and oriented. No focal neurological deficits. Extremities: no CCE.  Data Reviewed: Basic Metabolic Panel:  Recent Labs Lab 04/29/17 1757 04/30/17 0219  NA 121* 129*  K <2.0* 2.2*  CL 79* 92*  CO2 26 26  GLUCOSE 100* 90  BUN <5* <5*  CREATININE 0.83 0.68  CALCIUM 8.7* 8.4*  MG  1.8 1.8   Liver Function Tests:  Recent Labs Lab 04/29/17 1757 04/30/17 0219  AST 98* 81*  ALT 37 33  ALKPHOS 164* 153*  BILITOT 0.9 0.9  PROT 7.6 7.2  ALBUMIN 3.3* 3.0*   No results for input(s): LIPASE, AMYLASE in the last 168 hours. No results for input(s): AMMONIA in the last 168 hours. CBC:  Recent Labs Lab 04/29/17 1757 04/30/17 0219  WBC 9.2 7.1  NEUTROABS 5.5  --   HGB 12.6 13.3  HCT 36.1 36.4  MCV 97.6 98.1  PLT 255 261   Cardiac Enzymes:  Recent Labs Lab 04/29/17 2220 04/30/17 0219  CKTOTAL  --  53  CKMB  --  3.6  TROPONINI <0.03 <0.03   CBG (last 3)   Recent Labs  04/29/17 1753   GLUCAP 103*   Recent Results (from the past 240 hour(s))  MRSA PCR Screening     Status: None   Collection Time: 04/30/17  1:28 AM  Result Value Ref Range Status   MRSA by PCR NEGATIVE NEGATIVE Final    Comment:        The GeneXpert MRSA Assay (FDA approved for NASAL specimens only), is one component of a comprehensive MRSA colonization surveillance program. It is not intended to diagnose MRSA infection nor to guide or monitor treatment for MRSA infections.      Studies: Dg Chest Portable 1 View  Result Date: 04/29/2017 CLINICAL DATA:  Unresponsive EXAM: PORTABLE CHEST 1 VIEW COMPARISON:  09/02/2011 FINDINGS: The heart size and mediastinal contours are within normal limits. Both lungs are clear. The visualized skeletal structures are unremarkable. IMPRESSION: No active disease. Electronically Signed   By: Donavan Foil M.D.   On: 04/29/2017 20:12   Ct Head Code Stroke W/o Cm  Result Date: 04/29/2017 CLINICAL DATA:  Code stroke.  Unresponsive.  Slurred speech. EXAM: CT HEAD WITHOUT CONTRAST TECHNIQUE: Contiguous axial images were obtained from the base of the skull through the vertex without intravenous contrast. COMPARISON:  01/30/2013 CT head.  01/28/2013 MRI head. FINDINGS: Brain: No evidence for large acute infarct, focal mass effect, intracranial hemorrhage, hydrocephalus, or extra-axial collection. Mild chronic microvascular ischemic changes and parenchymal volume loss of the brain are stable. 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 Vascular: No hyperdense vessel identified. Calcific atherosclerosis of carotid siphons. Skull: Normal. Negative for fracture or focal lesion. Sinuses/Orbits: No acute finding. Other: None. ASPECTS [facility] Stroke Program Early CT Score) - Ganglionic level infarction (caudate, lentiform nuclei, internal capsule, insula, M1-M3  cortex): 7 - Supraganglionic infarction (M4-M6 cortex): 3 Total score (0-10 with 10 being normal): 10 IMPRESSION: 1. No acute intracranial abnormality identified. 2. Interval resolution of bilateral small subdural collections. 3. Stable mild chronic microvascular ischemic changes and mild parenchymal volume loss of the brain. 4. 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 5. ASPECTS is 10 These results were called by telephone at the time of interpretation on 04/29/2017 at 6:11 pm to Dr. Leonel Ramsay, who verbally acknowledged these results. Electronically Signed   By: Kristine Garbe M.D.   On: 04/29/2017 18:12   Scheduled Meds: . cholecalciferol  2,000 Units Oral Daily  . enoxaparin (LOVENOX) injection  40 mg Subcutaneous Q24H  . feeding supplement (ENSURE ENLIVE)  237 mL Oral BID BM  . folic acid  1  mg Intravenous Daily  . metoprolol succinate  25 mg Oral Daily  . multivitamin with minerals  1 tablet Oral Daily  . pantoprazole  40 mg Oral Daily  . sodium chloride flush  3 mL Intravenous Q12H  . thiamine  100 mg Oral Daily   Continuous Infusions: . 0.9 % NaCl with KCl 40 mEq / L Stopped (04/30/17 0408)  . levETIRAcetam    . magnesium sulfate 1 - 4 g bolus IVPB    . potassium chloride    . potassium phosphate IVPB (mEq) 30 mEq (04/30/17 0408)  . sodium chloride      Active Problems:   HYPERTENSION, BENIGN ESSENTIAL   Seizure disorder (HCC)   Hypokalemia   Esophageal reflux   Hyponatremia   Syncope   Nausea & vomiting   Critical Care Time spent: 38 mins  Irwin Brakeman, MD, FAAFP [facility] Pager [phone]  If 7PM-7AM, please contact night-coverage [email or web address] Password [email or web address] 04/30/2017, 8:14 AM    LOS: 1 day

## 2017-04-30 NOTE — Progress Notes (Signed)
Md notified of labs blood osmolaity 306 and potassium of 2.2.  SBP has been soft but maps of 60's.  Pt asymptomatic and resting.  Will continue to monitor. Saunders Revel T

## 2017-04-30 NOTE — Progress Notes (Signed)
Initial Nutrition Assessment  DOCUMENTATION CODES:   Severe malnutrition in context of acute illness/injury  INTERVENTION:   -Boost Breeze po TID, each supplement provides 250 kcal and 9 grams of protein  -Discontinue Ensure Enlive  -Recommend checking phosphorus level  -Continue MVI  NUTRITION DIAGNOSIS:   Malnutrition (Severe) related to acute illness as evidenced by mild depletion of body fat, mild depletion of muscle mass, energy intake < or equal to 50% for > or equal to 5 days, percent weight loss.  GOAL:   Patient will meet greater than or equal to 90% of their needs  MONITOR:   PO intake  REASON FOR ASSESSMENT:   Consult Assessment of nutrition requirement/status  ASSESSMENT:    56 yo female admitted after being found unresponsive by husband at home with possible seizure, severe electrolyte abnormalities.  Pt with hx of seizures, EtOH abuse, HTN  CIWA protocol  Pt reports no appetite for the past 1 month, only eating liquids (jello, soup, occasionally Ensure-but has not been drinking as it makes her sick to her stomach). Noted pt has been drinking alcohol per MD notes. Not tolerating solids food. Pt complains of N/V/D/abdominal pain   Pt did tolerate some eggs at breakfast this AM. EnsureEnlive ordered but pt does not like. Discussed option of Boost Breeze supplement. Pt is agreeable to this.    Pt reports weighing 157 pounds 1 month ago. Current wt 146 pounds. 7% wt loss in 1 month (significant for time frame)  Nutrition-Focused physical exam completed. Findings are mild fat depletion, mild muscle depletion, and no edema.   Labs: sodium 129, potassium 2.2 Meds: NS with KCl at 100 ml/hr, cholecalciferol, folic acid, mag sulfate, MVI/minerals, KCl, potassium phosphate, thiamine  Diet Order:  Diet regular Room service appropriate? Yes; Fluid consistency: Thin  Skin:  Reviewed, no issues  Last BM:  04/29/17  Height:   Ht Readings from Last 1 Encounters:   04/30/17 5\' 8"  (1.727 m)    Weight:   Wt Readings from Last 1 Encounters:  04/30/17 146 lb 9.7 oz (66.5 kg)    BMI:  Body mass index is 22.29 kg/m.  Estimated Nutritional Needs:   Kcal:  1650-1850 kcals  Protein:  83-93 g  Fluid:  >/= 1.7 L  EDUCATION NEEDS:   No education needs identified at this time  [place], Ronald, LDN [phone] Pager  [phone] Weekend/On-Call Pager

## 2017-04-30 NOTE — Progress Notes (Signed)
Subjective: No further seizures back to baseline  Exam: Vitals:   04/30/17 0537 04/30/17 0700  BP: (!) 82/64   Pulse: 93   Resp: (!) 21   Temp:  97.8 F (36.6 C)    HEENT-  Normocephalic, no lesions, without obvious abnormality.  Normal external eye and conjunctiva.  Normal TM's bilaterally.  Normal auditory canals and external ears. Normal external nose, mucus membranes and septum.  Normal pharynx. Cardiovascular- S1, S2 normal, pulses palpable throughout   Lungs- chest clear, no wheezing, rales, normal symmetric air entry, Heart exam - S1, S2 normal, no murmur, no gallop, rate regular Abdomen- normal findings: bowel sounds normal Extremities- no edema   Neuro:  CN: Pupils are equal and round. They are symmetrically reactive from 3-->2 mm. EOMI without nystagmus. Facial sensation is intact to light touch. Face is symmetric at rest with normal strength and mobility. Hearing is intact to conversational voice. Palate elevates symmetrically and uvula is midline. Voice is normal in tone, pitch and quality. Bilateral SCM and trapezii are 5/5. Tongue is midline with normal bulk and mobility.  Motor: Normal bulk, tone, and strength. 5/5 throughout. No drift.  Sensation: Intact to light touch.  DTRs: 2+, symmetric  Toes downgoing bilaterally. No pathologic reflexes.  Coordination: Finger-to-nose and heel-to-shin are without dysmetria     Pertinent Labs/Diagnostics: Sodium 129 Potassium 2.2   Etta Quill PA-C [facility] [phone]  Impression: Possible breakthrough seizure given the fact that she missed a dose of her Keppra. At this point she is back to baseline. At this point there is no need for MRI. Would continue with 1 g twice a day Keppra at discharge. No further recommendations per neurology other than continue to normalize electrolytes. A shunt will need to follow with her primary neurologist  Neurology will sign off at this time  04/30/2017, 10:39  AM  NEUROHOSPITALIST ADDENDUM Agree with the above. Case d/w Etta Quill, PA-C. Patient back to baseline. C/W antiepileptic Keppra 1g BID. F/u outpatient neurology.  -- Amie Portland, MD [facility] [phone]  If 7pm to 7am, please call on call as listed on AMION.

## 2017-04-30 NOTE — Care Management Note (Signed)
Case Management Note  Patient Details  Name: Cynthia Welch MRN: [id number] Date of Birth: 1961-04-01  Subjective/Objective:     Pt admitted after being found down by husband in the home, also having seizures               Action/Plan:   PTA independent from the home with husband.  ETOH.  CSW consulted for substance abuse   Expected Discharge Date:  05/03/17               Expected Discharge Plan:  Home/Self Care  In-House Referral:  Clinical Social Work  Discharge planning Services  CM Consult  Post Acute Care Choice:    Choice offered to:     DME Arranged:    DME Agency:     HH Arranged:    HH Agency:     Status of Service:     If discussed at [employer], dates discussed:    Additional Comments:  Maryclare Labrador, RN 04/30/2017, 9:33 AM

## 2017-04-30 NOTE — Progress Notes (Signed)
Preliminary results by tech - Carotid Duplex Completed. No evidence of a stenosis in bilateral carotid arteries. Vertebral arteries demonstrated antegrade flow. Oda Cogan, BS, RDMS, RVT

## 2017-04-30 NOTE — ED Notes (Signed)
Attempted report 

## 2017-05-01 DIAGNOSIS — E43 Unspecified severe protein-calorie malnutrition: Secondary | ICD-10-CM | POA: Insufficient documentation

## 2017-05-01 LAB — CBC
HCT: 29.6 % — ABNORMAL LOW (ref 36.0–46.0)
HEMOGLOBIN: 10 g/dL — AB (ref 12.0–15.0)
MCH: 34.5 pg — AB (ref 26.0–34.0)
MCHC: 33.8 g/dL (ref 30.0–36.0)
MCV: 102.1 fL — AB (ref 78.0–100.0)
PLATELETS: 248 10*3/uL (ref 150–400)
RBC: 2.9 MIL/uL — AB (ref 3.87–5.11)
RDW: 12.9 % (ref 11.5–15.5)
WBC: 5.4 10*3/uL (ref 4.0–10.5)

## 2017-05-01 LAB — BASIC METABOLIC PANEL
Anion gap: 7 (ref 5–15)
BUN: 6 mg/dL (ref 6–20)
CHLORIDE: 99 mmol/L — AB (ref 101–111)
CO2: 25 mmol/L (ref 22–32)
CREATININE: 0.72 mg/dL (ref 0.44–1.00)
Calcium: 8 mg/dL — ABNORMAL LOW (ref 8.9–10.3)
GFR calc non Af Amer: >60 mL/min (ref >60)
Glucose, Bld: 93 mg/dL (ref 65–99)
POTASSIUM: 3.1 mmol/L — AB (ref 3.5–5.1)
Sodium: 131 mmol/L — ABNORMAL LOW (ref 135–145)

## 2017-05-01 LAB — PHOSPHORUS: Phosphorus: 2 mg/dL — ABNORMAL LOW (ref 2.5–4.6)

## 2017-05-01 LAB — MAGNESIUM: Magnesium: 1.7 mg/dL (ref 1.7–2.4)

## 2017-05-01 LAB — PROLACTIN: Prolactin: 11.3 ng/mL (ref 4.8–23.3)

## 2017-05-01 MED ORDER — ADULT MULTIVITAMIN W/MINERALS CH: 1.0000 | ORAL_TABLET | Freq: Every day | ORAL | Status: DC

## 2017-05-01 MED ORDER — KETOROLAC TROMETHAMINE 30 MG/ML IJ SOLN
30.0000 mg | Freq: Four times a day (QID) | INTRAMUSCULAR | Status: DC | PRN
  Administered 2017-05-01: 30 mg via INTRAVENOUS
  Filled 2017-05-01: qty 1

## 2017-05-01 MED ORDER — K PHOS MONO-SOD PHOS DI & MONO 155-852-130 MG PO TABS: 500.0000 mg | ORAL_TABLET | Freq: Three times a day (TID) | ORAL | 0 refills | Status: DC

## 2017-05-01 MED ORDER — FOLIC ACID 1 MG PO TABS: 1.0000 mg | ORAL_TABLET | Freq: Every day | ORAL | Status: DC

## 2017-05-01 MED ORDER — FOLIC ACID 1 MG PO TABS: 1.0000 mg | ORAL_TABLET | Freq: Every day | ORAL | 0 refills | Status: DC

## 2017-05-01 MED ORDER — POTASSIUM CHLORIDE CRYS ER 20 MEQ PO TBCR
60.0000 meq | EXTENDED_RELEASE_TABLET | Freq: Once | ORAL | Status: AC
  Administered 2017-05-01: 60 meq via ORAL
  Filled 2017-05-01: qty 3

## 2017-05-01 MED ORDER — POTASSIUM CHLORIDE IN NACL 40-0.9 MEQ/L-% IV SOLN
INTRAVENOUS | Status: DC
  Administered 2017-05-01: 60 mL/h via INTRAVENOUS
  Filled 2017-05-01: qty 1000

## 2017-05-01 MED ORDER — LEVETIRACETAM 500 MG PO TABS: 1000.0000 mg | ORAL_TABLET | Freq: Two times a day (BID) | ORAL | Status: DC

## 2017-05-01 MED ORDER — POTASSIUM CHLORIDE 10 MEQ/100ML IV SOLN
10.0000 meq | INTRAVENOUS | Status: DC
  Administered 2017-05-01: 10 meq via INTRAVENOUS
  Filled 2017-05-01: qty 100

## 2017-05-01 MED ORDER — THIAMINE HCL 100 MG PO TABS: 100.0000 mg | ORAL_TABLET | Freq: Every day | ORAL | 0 refills | Status: DC

## 2017-05-01 MED ORDER — MAGNESIUM SULFATE 2 GM/50ML IV SOLN
2.0000 g | Freq: Once | INTRAVENOUS | Status: AC
  Administered 2017-05-01: 2 g via INTRAVENOUS
  Filled 2017-05-01: qty 50

## 2017-05-01 MED ORDER — K PHOS MONO-SOD PHOS DI & MONO 155-852-130 MG PO TABS: 500.0000 mg | ORAL_TABLET | Freq: Three times a day (TID) | ORAL | Status: DC

## 2017-05-01 MED ORDER — LEVETIRACETAM 1000 MG PO TABS: 1000.0000 mg | ORAL_TABLET | Freq: Two times a day (BID) | ORAL | 0 refills | Status: DC

## 2017-05-01 NOTE — Discharge Instructions (Signed)
No driving or operating machinery until cleared by a neurologist.      Follow with Primary MD  Rogers Blocker, MD  and other consultant's as instructed your Hospitalist MD  Please get a complete blood count and chemistry panel checked by your Primary MD at your next visit, and again as instructed by your Primary MD.  Get Medicines reviewed and adjusted: Please take all your medications with you for your next visit with your Primary MD  Laboratory/radiological data: Please request your Primary MD to go over all hospital tests and procedure/radiological results at the follow up, please ask your Primary MD to get all Hospital records sent to his/her office.  In some cases, they will be blood work, cultures and biopsy results pending at the time of your discharge. Please request that your primary care M.D. follows up on these results.  Also Note the following: If you experience worsening of your admission symptoms, develop shortness of breath, life threatening emergency, suicidal or homicidal thoughts you must seek medical attention immediately by calling 911 or calling your MD immediately  if symptoms less severe.  You must read complete instructions/literature along with all the possible adverse reactions/side effects for all the Medicines you take and that have been prescribed to you. Take any new Medicines after you have completely understood and accpet all the possible adverse reactions/side effects.   Do not drive when taking Pain medications or sleeping medications (Benzodaizepines)  Do not take more than prescribed Pain, Sleep and Anxiety Medications. It is not advisable to combine anxiety,sleep and pain medications without talking with your primary care practitioner  Special Instructions: If you have smoked or chewed Tobacco  in the last 2 yrs please stop smoking, stop any regular Alcohol  and or any Recreational drug use.  Wear Seat belts while driving.  Please note: You were cared  for by a hospitalist during your hospital stay. Once you are discharged, your primary care physician will handle any further medical issues. Please note that NO REFILLS for any discharge medications will be authorized once you are discharged, as it is imperative that you return to your primary care physician (or establish a relationship with a primary care physician if you do not have one) for your post hospital discharge needs so that they can reassess your need for medications and monitor your lab values.

## 2017-05-01 NOTE — Care Management Note (Signed)
Case Management Note  Patient Details  Name: Cynthia Welch MRN: [id number] Date of Birth: 05/28/1961  Subjective/Objective:     Pt admitted after being found down by husband in the home, also having seizures               Action/Plan:   PTA independent from the home with husband.  ETOH.  CSW consulted for substance abuse   Expected Discharge Date:  05/03/17               Expected Discharge Plan:  Home/Self Care  In-House Referral:  Clinical Social Work  Discharge planning Services  CM Consult  Post Acute Care Choice:    Choice offered to:     DME Arranged:    DME Agency:     HH Arranged:    HH Agency:     Status of Service:     If discussed at [employer], dates discussed:    Additional Comments: 05/01/2017 Pt to discharge home today.  Husband at bedside.  Pt independently ambulatory without concerns of returning home.  Pt states she goes to Dr Marlou Sa and is able to get medications at an affordable cost at the clinic.  NO CM Needs prior to discharge  Maryclare Labrador, RN 05/01/2017, 2:13 PM

## 2017-05-01 NOTE — Discharge Summary (Signed)
Physician Discharge Summary  Cynthia Welch SAY:[id number] DOB: 08-07-1961 DOA: 04/29/2017  PCP: Rogers Blocker, MD  Admit date: 04/29/2017 Discharge date: 05/01/2017  Admitted From: Home  Disposition: Home  Recommendations for Outpatient Follow-up:  1. Follow up with PCP in 1 weeks 2. Follow up with neurologist in 2-3 weeks 3. Please obtain BMP/CBC in one week  Discharge Condition: Stable  CODE STATUS: FULL    Brief Hospitalization Summary: Please see all hospital notes, images, labs for full details of the hospitalization.   Cynthia Welch  is a 56 y.o. female, w seizure , Etoh use,  Presents with unresponsiveness.  Her husband found her down on the couch and called 911.  Pt admits to vodka use today with orange juice.  Pt states that she hasn't recently been taking keppra, but thinks she has been taking some other medication.  Pt arrived at [facility] and code stroke was called.  Neurology saw the patient and no TPA was given.    In ED, CT brain => no acute intracranial abnormality,  67mm hyperdense focus of the lower pituitary stalk stable from prior MR,  Pt was noted to have Na=121, and K <2.  Pt was given keppra iv in the ED.  Pt will be admitted for seizure , etoh dep, and severe hypokalemia, and hyponatremia.     Pt will be admitted for w/up of unresponsiveness, ? Seizure, and severe hypokalemia, hyponatremia.    Brief Admission Hx: JudithSpitznagelis a 56 y.o.female,w seizure , Etoh use, Presents with unresponsiveness. Her husband found her down on the couch and called 911. Pt admits to vodka use today with orange juice. Pt states that she hasn't recently been taking keppra, but thinks she has been taking some other medication. Pt arrived at [facility] and code stroke was called. Neurology saw the patient and no TPA was given.   MDM/Assessment & Plan:   Unresponsive ? Syncope. suspect this was secondary to seizure vs binge ETOH use (ETOH level =260) Trop I q6h x3  negative Carotid ultrasound negative  cardiac echo with no sig abnormalities Neurology consultation was done and recommended Keppra 1000 mg BID Pt is back to her baseline mental status Avoid alcohol, avoid driving and operating machinery  Seizure Disorder with suspected noncompliance with medications Keppra 1gm iv bid Seizure precautions  Hypokalemia - severe  Replaced magnesium and potassium IV Continue to supplement potassium,  Phosphorus with Kphos TID for 2 weeks Discontinued diuretic, recheck BMP with PCP in 1 week.   Hypotension with good MAP - asymptomatic and stable.  Likely her baseline.   Hyponatremia Suspect secondary to home thiazide diuretic STOPPED triamterene/hydrochlorothiazide Treated with NS with some improvement.  Nausea and vomitting zofran prn  protonix 40mg  po qday  Hypertension Continue metoprolol Discontinued triam/HCTZ  ETOH use CIWA  DVT ProphylaxisLovenox - SCDs  AM Labs Ordered, also please review Full Orders  Family Communication:Admission, patients condition and plan of care including tests being ordered have been discussed with the patient who indicate understanding and agree with the plan and Code Status.  Code Status FULL CODE  Likely DC to home  Condition GUARDED   Consults called:neurology  Discharge Diagnoses:  Active Problems:   HYPERTENSION, BENIGN ESSENTIAL   Seizure disorder (HCC)   Hypokalemia   Esophageal reflux   Hyponatremia   Syncope   Nausea & vomiting   Protein-calorie malnutrition, severe  Discharge Instructions: Discharge Instructions    Ambulatory referral to Neurology    Complete by:  As directed  An appointment is requested in approximately: 2 weeks   Increase activity slowly    Complete by:  As directed      Allergies as of 05/01/2017      Reactions   Oxycodone Hcl Itching   Acetaminophen Itching      Medication List    STOP taking these medications   ibuprofen  600 MG tablet Commonly known as:  ADVIL,MOTRIN   oxyCODONE-acetaminophen 5-325 MG tablet Commonly known as:  PERCOCET/ROXICET   traMADol 50 MG tablet Commonly known as:  ULTRAM   triamterene-hydrochlorothiazide 37.5-25 MG tablet Commonly known as:  MAXZIDE-25     TAKE these medications   folic acid 1 MG tablet Commonly known as:  FOLVITE Take 1 tablet (1 mg total) by mouth daily.   levETIRAcetam 1000 MG tablet Commonly known as:  KEPPRA Take 1 tablet (1,000 mg total) by mouth 2 (two) times daily. What changed:  medication strength  how much to take   metoprolol succinate 25 MG 24 hr tablet Commonly known as:  TOPROL-XL Take 25 mg by mouth daily.   multivitamin with minerals Tabs tablet Take 1 tablet by mouth daily. Start taking on:  05/02/2017   omeprazole 40 MG capsule Commonly known as:  PRILOSEC Take 40 mg by mouth daily.   phosphorus 155-852-130 MG tablet Commonly known as:  K PHOS NEUTRAL Take 2 tablets (500 mg total) by mouth 3 (three) times daily. Start taking on:  05/02/2017   thiamine 100 MG tablet Take 1 tablet (100 mg total) by mouth daily. Start taking on:  05/02/2017   Vitamin D3 2000 units Tabs Take 1 tablet by mouth daily.      Follow-up Information    Rogers Blocker, MD. Schedule an appointment as soon as possible for a visit in 1 week(s).   Specialty:  Internal Medicine Why:  Hospital Follow Up  Contact information: [place] [id number] [phone]          Allergies  Allergen Reactions  . Oxycodone Hcl Itching  . Acetaminophen Itching   Current Discharge Medication List    START taking these medications   Details  folic acid (FOLVITE) 1 MG tablet Take 1 tablet (1 mg total) by mouth daily. Qty: 30 tablet, Refills: 0    Multiple Vitamin (MULTIVITAMIN WITH MINERALS) TABS tablet Take 1 tablet by mouth daily.    phosphorus (K PHOS NEUTRAL) 155-852-130 MG tablet Take 2 tablets (500 mg total) by mouth 3  (three) times daily. Qty: 84 tablet, Refills: 0    thiamine 100 MG tablet Take 1 tablet (100 mg total) by mouth daily. Qty: 30 tablet, Refills: 0      CONTINUE these medications which have CHANGED   Details  levETIRAcetam (KEPPRA) 1000 MG tablet Take 1 tablet (1,000 mg total) by mouth 2 (two) times daily. Qty: 60 tablet, Refills: 0      CONTINUE these medications which have NOT CHANGED   Details  Cholecalciferol (VITAMIN D3) 2000 UNITS TABS Take 1 tablet by mouth daily.    metoprolol succinate (TOPROL-XL) 25 MG 24 hr tablet Take 25 mg by mouth daily.    omeprazole (PRILOSEC) 40 MG capsule Take 40 mg by mouth daily.      STOP taking these medications     triamterene-hydrochlorothiazide (MAXZIDE-25) 37.5-25 MG tablet      ibuprofen (ADVIL,MOTRIN) 600 MG tablet      oxyCODONE-acetaminophen (PERCOCET/ROXICET) 5-325 MG tablet      traMADol (ULTRAM) 50 MG  tablet         Procedures/Studies: Dg Chest Portable 1 View  Result Date: 04/29/2017 CLINICAL DATA:  Unresponsive EXAM: PORTABLE CHEST 1 VIEW COMPARISON:  09/02/2011 FINDINGS: The heart size and mediastinal contours are within normal limits. Both lungs are clear. The visualized skeletal structures are unremarkable. IMPRESSION: No active disease. Electronically Signed   By: Donavan Foil M.D.   On: 04/29/2017 20:12   Ct Head Code Stroke W/o Cm  Result Date: 04/29/2017 CLINICAL DATA:  Code stroke.  Unresponsive.  Slurred speech. EXAM: CT HEAD WITHOUT CONTRAST TECHNIQUE: Contiguous axial images were obtained from the base of the skull through the vertex without intravenous contrast. COMPARISON:  01/30/2013 CT head.  01/28/2013 MRI head. FINDINGS: Brain: No evidence for large acute infarct, focal mass effect, intracranial hemorrhage, hydrocephalus, or extra-axial collection. Mild chronic microvascular ischemic changes and parenchymal volume loss of the brain are stable. 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 Vascular: No hyperdense vessel identified. Calcific atherosclerosis of carotid siphons. Skull: Normal. Negative for fracture or focal lesion. Sinuses/Orbits: No acute finding. Other: None. ASPECTS [facility] Stroke Program Early CT Score) - Ganglionic level infarction (caudate, lentiform nuclei, internal capsule, insula, M1-M3 cortex): 7 - Supraganglionic infarction (M4-M6 cortex): 3 Total score (0-10 with 10 being normal): 10 IMPRESSION: 1. No acute intracranial abnormality identified. 2. Interval resolution of bilateral small subdural collections. 3. Stable mild chronic microvascular ischemic changes and mild parenchymal volume loss of the brain. 4. 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 5. ASPECTS is 10 These results were called by telephone at the time of interpretation on 04/29/2017 at 6:11 pm to Dr. Leonel Ramsay, who verbally acknowledged these results. Electronically Signed   By: Kristine Garbe M.D.   On: 04/29/2017 18:12      Subjective: Pt says she feels much better and wants to discharge home. Says she will follow up with Dr. Marlou Sa.   Discharge Exam: Vitals:   05/01/17 0751 05/01/17 1220  BP: (!) 81/59 (!) 89/71  Pulse: 84 76  Resp: 19 (!) 21  Temp: 97.8 F (36.6 C) 97.9 F (36.6 C)   Vitals:   05/01/17 0311 05/01/17 0400 05/01/17 0751 05/01/17 1220  BP:  (!) 87/65 (!) 81/59 (!) 89/71  Pulse:  80 84 76  Resp:  (!) 21 19 (!) 21  Temp:   97.8 F (36.6 C) 97.9 F (36.6 C)  TempSrc:   Oral Oral  SpO2:  99% 95% 95%  Weight: 70.5 kg (155 lb 8 oz)     Height:       General: Pt is alert, awake, not in acute distress Cardiovascular: RRR, S1/S2 +, no rubs, no gallops Respiratory: CTA bilaterally, no wheezing, no rhonchi Abdominal: Soft, NT, ND, bowel sounds + Extremities: no  edema, no cyanosis   The results of significant diagnostics from this hospitalization (including imaging, microbiology, ancillary and laboratory) are listed below for reference.     Microbiology: Recent Results (from the past 240 hour(s))  MRSA PCR Screening     Status: None   Collection Time: 04/30/17  1:28 AM  Result Value Ref Range Status   MRSA by PCR NEGATIVE NEGATIVE Final    Comment:        The GeneXpert MRSA Assay (FDA approved for NASAL specimens only), is one  component of a comprehensive MRSA colonization surveillance program. It is not intended to diagnose MRSA infection nor to guide or monitor treatment for MRSA infections.      Labs: BNP (last 3 results) No results for input(s): BNP in the last 8760 hours. Basic Metabolic Panel:  Recent Labs Lab 04/29/17 1757 04/30/17 0219 04/30/17 0950 05/01/17 0321  NA 121* 129* 127* 131*  K <2.0* 2.2* 2.6* 3.1*  CL 79* 92* 92* 99*  CO2 26 26 25 25   GLUCOSE 100* 90 134* 93  BUN <5* <5* <5* 6  CREATININE 0.83 0.68 0.82 0.72  CALCIUM 8.7* 8.4* 7.9* 8.0*  MG 1.8 1.8  --  1.7  PHOS  --   --   --  2.0*   Liver Function Tests:  Recent Labs Lab 04/29/17 1757 04/30/17 0219  AST 98* 81*  ALT 37 33  ALKPHOS 164* 153*  BILITOT 0.9 0.9  PROT 7.6 7.2  ALBUMIN 3.3* 3.0*   No results for input(s): LIPASE, AMYLASE in the last 168 hours. No results for input(s): AMMONIA in the last 168 hours. CBC:  Recent Labs Lab 04/29/17 1757 04/30/17 0219 05/01/17 0321  WBC 9.2 7.1 5.4  NEUTROABS 5.5  --   --   HGB 12.6 13.3 10.0*  HCT 36.1 36.4 29.6*  MCV 97.6 98.1 102.1*  PLT 255 261 248   Cardiac Enzymes:  Recent Labs Lab 04/29/17 2220 04/30/17 0219 04/30/17 0950  CKTOTAL  --  53  --   CKMB  --  3.6  --   TROPONINI <0.03 <0.03 <0.03   BNP: Invalid input(s): POCBNP CBG:  Recent Labs Lab 04/29/17 1753  GLUCAP 103*   D-Dimer No results for input(s): DDIMER in the last 72 hours. Hgb A1c No results for  input(s): HGBA1C in the last 72 hours. Lipid Profile No results for input(s): CHOL, HDL, LDLCALC, TRIG, CHOLHDL, LDLDIRECT in the last 72 hours. Thyroid function studies  Recent Labs  04/30/17 0219  TSH 2.352   Anemia work up No results for input(s): VITAMINB12, FOLATE, FERRITIN, TIBC, IRON, RETICCTPCT in the last 72 hours. Urinalysis    Component Value Date/Time   COLORURINE YELLOW 04/29/2017 1826   APPEARANCEUR HAZY (A) 04/29/2017 1826   LABSPEC 1.003 (L) 04/29/2017 1826   PHURINE 8.0 04/29/2017 1826   GLUCOSEU NEGATIVE 04/29/2017 1826   HGBUR NEGATIVE 04/29/2017 1826   HGBUR negative 08/26/2010 1059   BILIRUBINUR NEGATIVE 04/29/2017 1826   KETONESUR NEGATIVE 04/29/2017 1826   PROTEINUR NEGATIVE 04/29/2017 1826   UROBILINOGEN 0.2 05/18/2015 2104   NITRITE NEGATIVE 04/29/2017 1826   LEUKOCYTESUR TRACE (A) 04/29/2017 1826   Sepsis Labs Invalid input(s): PROCALCITONIN,  WBC,  LACTICIDVEN Microbiology Recent Results (from the past 240 hour(s))  MRSA PCR Screening     Status: None   Collection Time: 04/30/17  1:28 AM  Result Value Ref Range Status   MRSA by PCR NEGATIVE NEGATIVE Final    Comment:        The GeneXpert MRSA Assay (FDA approved for NASAL specimens only), is one component of a comprehensive MRSA colonization surveillance program. It is not intended to diagnose MRSA infection nor to guide or monitor treatment for MRSA infections.    Time coordinating discharge: 33 mins  SIGNED:  Irwin Brakeman, MD  [facility] 05/01/2017, 2:18 PM Pager [phone]  If 7PM-7AM, please contact night-coverage [email or web address] Password [email or web address]

## 2017-05-15 ENCOUNTER — Inpatient Hospital Stay (HOSPITAL_COMMUNITY)
Admission: EM | Admit: 2017-05-15 | Discharge: 2017-05-17 | DRG: 101 | Disposition: A | Discharge status: Home / Self Care | Payer: Self-pay | Attending: Internal Medicine | Admitting: Internal Medicine

## 2017-05-15 ENCOUNTER — Encounter (HOSPITAL_COMMUNITY): Payer: Self-pay | Admitting: *Deleted

## 2017-05-15 ENCOUNTER — Emergency Department (HOSPITAL_COMMUNITY): Payer: Self-pay

## 2017-05-15 DIAGNOSIS — F10129 Alcohol abuse with intoxication, unspecified: Secondary | ICD-10-CM | POA: Diagnosis present

## 2017-05-15 DIAGNOSIS — E876 Hypokalemia: Secondary | ICD-10-CM | POA: Diagnosis present

## 2017-05-15 DIAGNOSIS — E871 Hypo-osmolality and hyponatremia: Secondary | ICD-10-CM | POA: Diagnosis present

## 2017-05-15 DIAGNOSIS — G40919 Epilepsy, unspecified, intractable, without status epilepticus: Secondary | ICD-10-CM

## 2017-05-15 DIAGNOSIS — Z800 Family history of malignant neoplasm of digestive organs: Secondary | ICD-10-CM

## 2017-05-15 DIAGNOSIS — Z9114 Patient's other noncompliance with medication regimen: Secondary | ICD-10-CM

## 2017-05-15 DIAGNOSIS — Z9889 Other specified postprocedural states: Secondary | ICD-10-CM

## 2017-05-15 DIAGNOSIS — R112 Nausea with vomiting, unspecified: Secondary | ICD-10-CM | POA: Diagnosis present

## 2017-05-15 DIAGNOSIS — K219 Gastro-esophageal reflux disease without esophagitis: Secondary | ICD-10-CM | POA: Diagnosis present

## 2017-05-15 DIAGNOSIS — Z8701 Personal history of pneumonia (recurrent): Secondary | ICD-10-CM

## 2017-05-15 DIAGNOSIS — I10 Essential (primary) hypertension: Secondary | ICD-10-CM | POA: Diagnosis present

## 2017-05-15 DIAGNOSIS — Y908 Blood alcohol level of 240 mg/100 ml or more: Secondary | ICD-10-CM | POA: Diagnosis present

## 2017-05-15 DIAGNOSIS — G40909 Epilepsy, unspecified, not intractable, without status epilepticus: Principal | ICD-10-CM | POA: Diagnosis present

## 2017-05-15 DIAGNOSIS — Z90710 Acquired absence of both cervix and uterus: Secondary | ICD-10-CM

## 2017-05-15 DIAGNOSIS — Z833 Family history of diabetes mellitus: Secondary | ICD-10-CM

## 2017-05-15 DIAGNOSIS — F17210 Nicotine dependence, cigarettes, uncomplicated: Secondary | ICD-10-CM | POA: Diagnosis present

## 2017-05-15 DIAGNOSIS — D649 Anemia, unspecified: Secondary | ICD-10-CM | POA: Diagnosis present

## 2017-05-15 DIAGNOSIS — R569 Unspecified convulsions: Secondary | ICD-10-CM

## 2017-05-15 DIAGNOSIS — E860 Dehydration: Secondary | ICD-10-CM | POA: Diagnosis present

## 2017-05-15 DIAGNOSIS — Z79899 Other long term (current) drug therapy: Secondary | ICD-10-CM

## 2017-05-15 DIAGNOSIS — R740 Nonspecific elevation of levels of transaminase and lactic acid dehydrogenase [LDH]: Secondary | ICD-10-CM | POA: Diagnosis present

## 2017-05-15 DIAGNOSIS — Z888 Allergy status to other drugs, medicaments and biological substances status: Secondary | ICD-10-CM

## 2017-05-15 HISTORY — DX: Unspecified asthma, uncomplicated: J45.909

## 2017-05-15 HISTORY — DX: Major depressive disorder, single episode, unspecified: F32.9

## 2017-05-15 HISTORY — DX: Pure hypercholesterolemia, unspecified: E78.00

## 2017-05-15 HISTORY — DX: Depression, unspecified: F32.A

## 2017-05-15 HISTORY — DX: Gastro-esophageal reflux disease without esophagitis: K21.9

## 2017-05-15 LAB — HEPATIC FUNCTION PANEL
ALK PHOS: 384 U/L — AB (ref 38–126)
ALT: 63 U/L — AB (ref 14–54)
AST: 197 U/L — ABNORMAL HIGH (ref 15–41)
Albumin: 3.4 g/dL — ABNORMAL LOW (ref 3.5–5.0)
BILIRUBIN DIRECT: 0.4 mg/dL (ref 0.1–0.5)
BILIRUBIN INDIRECT: 0.9 mg/dL (ref 0.3–0.9)
TOTAL PROTEIN: 8.3 g/dL — AB (ref 6.5–8.1)
Total Bilirubin: 1.3 mg/dL — ABNORMAL HIGH (ref 0.3–1.2)

## 2017-05-15 LAB — CBC
HEMATOCRIT: 37.6 % (ref 36.0–46.0)
Hemoglobin: 13.6 g/dL (ref 12.0–15.0)
MCH: 35.8 pg — ABNORMAL HIGH (ref 26.0–34.0)
MCHC: 36.2 g/dL — AB (ref 30.0–36.0)
MCV: 98.9 fL (ref 78.0–100.0)
PLATELETS: 312 10*3/uL (ref 150–400)
RBC: 3.8 MIL/uL — ABNORMAL LOW (ref 3.87–5.11)
RDW: 12.2 % (ref 11.5–15.5)
WBC: 8.3 10*3/uL (ref 4.0–10.5)

## 2017-05-15 LAB — I-STAT TROPONIN, ED: Troponin i, poc: 0.01 ng/mL (ref 0.00–0.08)

## 2017-05-15 LAB — ETHANOL: Alcohol, Ethyl (B): 99 mg/dL — ABNORMAL HIGH (ref <5)

## 2017-05-15 LAB — BASIC METABOLIC PANEL
Anion gap: 17 — ABNORMAL HIGH (ref 5–15)
BUN: 12 mg/dL (ref 6–20)
CHLORIDE: 91 mmol/L — AB (ref 101–111)
CO2: 24 mmol/L (ref 22–32)
CREATININE: 0.88 mg/dL (ref 0.44–1.00)
Calcium: 9.4 mg/dL (ref 8.9–10.3)
GFR calc Af Amer: >60 mL/min (ref >60)
GLUCOSE: 100 mg/dL — AB (ref 65–99)
POTASSIUM: 2.2 mmol/L — AB (ref 3.5–5.1)
SODIUM: 132 mmol/L — AB (ref 135–145)

## 2017-05-15 LAB — POTASSIUM: Potassium: 2.8 mmol/L — ABNORMAL LOW (ref 3.5–5.1)

## 2017-05-15 LAB — MAGNESIUM: MAGNESIUM: 1.6 mg/dL — AB (ref 1.7–2.4)

## 2017-05-15 MED ORDER — ZONISAMIDE 100 MG PO CAPS
100.0000 mg | ORAL_CAPSULE | Freq: Every day | ORAL | Status: DC
  Administered 2017-05-15 – 2017-05-17 (×3): 100 mg via ORAL
  Filled 2017-05-15 (×3): qty 1

## 2017-05-15 MED ORDER — LORAZEPAM 1 MG PO TABS: 1.0000 mg | ORAL_TABLET | Freq: Four times a day (QID) | ORAL | Status: DC | PRN

## 2017-05-15 MED ORDER — FOLIC ACID 1 MG PO TABS
1.0000 mg | ORAL_TABLET | Freq: Every day | ORAL | Status: DC
  Administered 2017-05-16 – 2017-05-17 (×2): 1 mg via ORAL
  Filled 2017-05-15 (×2): qty 1

## 2017-05-15 MED ORDER — POTASSIUM CHLORIDE 10 MEQ/100ML IV SOLN
10.0000 meq | Freq: Once | INTRAVENOUS | Status: DC
  Filled 2017-05-15: qty 100

## 2017-05-15 MED ORDER — POTASSIUM CHLORIDE IN NACL 20-0.9 MEQ/L-% IV SOLN
INTRAVENOUS | Status: DC
  Administered 2017-05-15 – 2017-05-16 (×2): via INTRAVENOUS
  Filled 2017-05-15 (×2): qty 1000

## 2017-05-15 MED ORDER — POTASSIUM CHLORIDE 10 MEQ/100ML IV SOLN
10.0000 meq | INTRAVENOUS | Status: AC
  Administered 2017-05-15 (×3): 10 meq via INTRAVENOUS
  Filled 2017-05-15 (×2): qty 100

## 2017-05-15 MED ORDER — FOLIC ACID 5 MG/ML IJ SOLN
1.0000 mg | Freq: Once | INTRAMUSCULAR | Status: AC
  Administered 2017-05-15: 1 mg via INTRAVENOUS
  Filled 2017-05-15: qty 0.2

## 2017-05-15 MED ORDER — METOPROLOL SUCCINATE ER 25 MG PO TB24
25.0000 mg | ORAL_TABLET | Freq: Every day | ORAL | Status: DC
  Administered 2017-05-16 – 2017-05-17 (×2): 25 mg via ORAL
  Filled 2017-05-15 (×3): qty 1

## 2017-05-15 MED ORDER — ONDANSETRON HCL 4 MG PO TABS: 4.0000 mg | ORAL_TABLET | Freq: Four times a day (QID) | ORAL | Status: DC | PRN

## 2017-05-15 MED ORDER — ENSURE ENLIVE PO LIQD: 237.0000 mL | Freq: Two times a day (BID) | ORAL | Status: DC

## 2017-05-15 MED ORDER — LORAZEPAM 2 MG/ML IJ SOLN: 1.0000 mg | Freq: Four times a day (QID) | INTRAMUSCULAR | Status: DC | PRN

## 2017-05-15 MED ORDER — MAGNESIUM SULFATE 2 GM/50ML IV SOLN
2.0000 g | Freq: Once | INTRAVENOUS | Status: AC
  Administered 2017-05-15: 2 g via INTRAVENOUS
  Filled 2017-05-15: qty 50

## 2017-05-15 MED ORDER — FOLIC ACID 1 MG PO TABS: 1.0000 mg | ORAL_TABLET | Freq: Every day | ORAL | Status: DC

## 2017-05-15 MED ORDER — VITAMIN B-1 100 MG PO TABS
100.0000 mg | ORAL_TABLET | Freq: Every day | ORAL | Status: DC
  Administered 2017-05-16 – 2017-05-17 (×2): 100 mg via ORAL
  Filled 2017-05-15 (×2): qty 1

## 2017-05-15 MED ORDER — LACTATED RINGERS IV BOLUS (SEPSIS)
1000.0000 mL | Freq: Once | INTRAVENOUS | Status: AC
  Administered 2017-05-15: 1000 mL via INTRAVENOUS

## 2017-05-15 MED ORDER — POTASSIUM CHLORIDE CRYS ER 20 MEQ PO TBCR
40.0000 meq | EXTENDED_RELEASE_TABLET | Freq: Once | ORAL | Status: DC
  Filled 2017-05-15: qty 2

## 2017-05-15 MED ORDER — ONDANSETRON HCL 4 MG/2ML IJ SOLN: 4.0000 mg | Freq: Four times a day (QID) | INTRAMUSCULAR | Status: DC | PRN

## 2017-05-15 MED ORDER — VITAMIN D 1000 UNITS PO TABS
2000.0000 [IU] | ORAL_TABLET | Freq: Every day | ORAL | Status: DC
  Administered 2017-05-15 – 2017-05-17 (×3): 2000 [IU] via ORAL
  Filled 2017-05-15 (×3): qty 2

## 2017-05-15 MED ORDER — THIAMINE HCL 100 MG/ML IJ SOLN
100.0000 mg | Freq: Every day | INTRAMUSCULAR | Status: DC
  Administered 2017-05-15: 100 mg via INTRAVENOUS
  Filled 2017-05-15: qty 2

## 2017-05-15 MED ORDER — ENOXAPARIN SODIUM 30 MG/0.3ML ~~LOC~~ SOLN
30.0000 mg | SUBCUTANEOUS | Status: DC
  Administered 2017-05-15 – 2017-05-16 (×2): 30 mg via SUBCUTANEOUS
  Filled 2017-05-15 (×2): qty 0.3

## 2017-05-15 MED ORDER — PANTOPRAZOLE SODIUM 40 MG PO TBEC
40.0000 mg | DELAYED_RELEASE_TABLET | Freq: Every day | ORAL | Status: DC
  Administered 2017-05-15 – 2017-05-17 (×3): 40 mg via ORAL
  Filled 2017-05-15 (×3): qty 1

## 2017-05-15 MED ORDER — K PHOS MONO-SOD PHOS DI & MONO 155-852-130 MG PO TABS
500.0000 mg | ORAL_TABLET | Freq: Three times a day (TID) | ORAL | Status: DC
Start: 2017-05-15 — End: 2017-05-17
  Administered 2017-05-15 – 2017-05-17 (×6): 500 mg via ORAL
  Filled 2017-05-15 (×7): qty 2

## 2017-05-15 MED ORDER — SODIUM CHLORIDE 0.9 % IV SOLN
200.0000 mg | Freq: Two times a day (BID) | INTRAVENOUS | Status: DC
  Filled 2017-05-15: qty 20

## 2017-05-15 MED ORDER — SODIUM CHLORIDE 0.9 % IV SOLN
100.0000 mg | Freq: Two times a day (BID) | INTRAVENOUS | Status: DC
  Administered 2017-05-16 – 2017-05-17 (×3): 100 mg via INTRAVENOUS
  Filled 2017-05-15 (×4): qty 10

## 2017-05-15 MED ORDER — ADULT MULTIVITAMIN W/MINERALS CH: 1.0000 | ORAL_TABLET | Freq: Every day | ORAL | Status: DC

## 2017-05-15 MED ORDER — ADULT MULTIVITAMIN W/MINERALS CH
1.0000 | ORAL_TABLET | Freq: Every day | ORAL | Status: DC
  Administered 2017-05-15 – 2017-05-17 (×3): 1 via ORAL
  Filled 2017-05-15 (×3): qty 1

## 2017-05-15 MED ORDER — LACOSAMIDE 200 MG/20ML IV SOLN
200.0000 mg | INTRAVENOUS | Status: AC
Start: 2017-05-15 — End: 2017-05-15
  Administered 2017-05-15: 200 mg via INTRAVENOUS
  Filled 2017-05-15: qty 20

## 2017-05-15 MED ORDER — SODIUM CHLORIDE 0.9 % IV SOLN
100.0000 mg | Freq: Two times a day (BID) | INTRAVENOUS | Status: DC
  Filled 2017-05-15: qty 10

## 2017-05-15 MED ORDER — LORAZEPAM 2 MG/ML IJ SOLN: 1.0000 mg | Freq: Once | INTRAMUSCULAR | Status: DC

## 2017-05-15 MED ORDER — THIAMINE HCL 100 MG/ML IJ SOLN: 100.0000 mg | Freq: Once | INTRAMUSCULAR | Status: DC

## 2017-05-15 MED ORDER — THIAMINE HCL 100 MG PO TABS: 100.0000 mg | ORAL_TABLET | Freq: Every day | ORAL | Status: DC

## 2017-05-15 MED ORDER — METOCLOPRAMIDE HCL 5 MG/ML IJ SOLN
10.0000 mg | Freq: Once | INTRAMUSCULAR | Status: AC
  Administered 2017-05-15: 10 mg via INTRAVENOUS
  Filled 2017-05-15: qty 2

## 2017-05-15 NOTE — ED Notes (Signed)
ED Provider at bedside. 

## 2017-05-15 NOTE — H&P (Signed)
History and Physical        Hospital Admission Note Date: 05/15/2017  Patient name: Cynthia Welch Medical record number: [id number] Date of birth: [birth year] Age: 56 y.o. Gender: female  PCP: Rogers Blocker, MD    Patient coming from: home  I have reviewed all records in the [place].    Chief Complaint:  Intractable nausea, vomiting since started Keppra  HPI: Patient is a 56 year old female with history of seizures, alcohol use who was recently discharged from the hospital on 7/10 who had presented with seizure and was discharged on Keppra. Patient reported that she was okay for 1 or 2 days and then subsequently started having nausea and vomiting. She has been on Keppra once before and it causes nausea and vomiting. For the last 2-3 days she's been having persistent and intractable nausea and vomiting. She has been unable to eat or drink at all, states everything comes back up. She has been feeling very dizzy and lightheaded and states that she possibly had 1 seizure yesterday. She feels very frustrated, agitated and fatigued. Denied any fevers, chills, abdominal pain, hematemesis.   ED work-up/course:  Temperature 90.8, respiratory rate 18, heart rate 97, BP 131/92, O2 sats 100% on room air Sodium 132, potassium 2.2, creatinine 0.8, magnesium 1.6, alkaline phosphatase 384, AST 197, ALT 63 Alcohol level elevated 99 CBC unremarkable Chest x-ray shows no active cardiac or pulmonary disease  Review of Systems: Positives marked in 'bold' Constitutional: Denies fever, chills, diaphoresis,+ poor appetite and fatigue.  HEENT: Denies photophobia, eye pain, redness, hearing loss, ear pain, congestion, sore throat, rhinorrhea, sneezing, mouth sores, trouble swallowing, neck pain, neck stiffness and tinnitus.   Respiratory: Denies SOB, DOE, cough, chest tightness,  and  wheezing.   Cardiovascular: Denies chest pain, palpitations and leg swelling.  Gastrointestinal: Please see history of present illness. Denied diarrhea, constipation, blood in stool and abdominal distention.  Genitourinary: Denies dysuria, urgency, frequency, hematuria, flank pain and difficulty urinating.  Musculoskeletal: Denies myalgias, back pain, joint swelling, arthralgias and gait problem.  Skin: Denies pallor, rash and wound.  Neurological:  Please see history of present illness Hematological: Denies adenopathy. Easy bruising, personal or family bleeding history  Psychiatric/Behavioral: Denies suicidal ideation, mood changes, confusion, nervousness, sleep disturbance and agitation  Past Medical History: Past Medical History:  Diagnosis Date  . Anxiety    none recently  . Asthma    as a child  . Fibroids   . Headache    reduced in last year  . Hypertension   . Pneumonia   . PONV (postoperative nausea and vomiting)    in the past "used to get nauseous"  . Seizures ([place])    none for 6 months ago (as of 10/04/16)  . Subdural hematoma ([place]) 01/29/2013   Per MRI 01/2013    Past Surgical History:  Procedure Laterality Date  . ABDOMINAL HYSTERECTOMY N/A 09/07/2015   Procedure: HYSTERECTOMY ABDOMINAL, CYSTOTOMY WITH BLADDER REPAIR;  Surgeon: Mora Bellman, MD;  Location: [facility];  Service: Gynecology;  Laterality: N/A;  Requested 09/07/15 @ 2:15p  . BILATERAL SALPINGECTOMY Bilateral 09/07/2015   Procedure: BILATERAL SALPINGECTOMY;  Surgeon: Mora Bellman, MD;  Location:  [facility];  Service: Gynecology;  Laterality: Bilateral;  . DIAGNOSTIC LAPAROSCOPY    . DILATION AND CURETTAGE OF UTERUS    . ESOPHAGOGASTRODUODENOSCOPY N/A 10/05/2016   Procedure: ESOPHAGOGASTRODUODENOSCOPY (EGD);  Surgeon: Wilford Corner, MD;  Location: [facility];  Service: Endoscopy;  Laterality: N/A;  . ORIF ULNAR FRACTURE  09/03/2011   Procedure: OPEN REDUCTION INTERNAL FIXATION (ORIF) ULNAR FRACTURE;   Surgeon: Meredith Pel;  Location: [facility];  Service: Orthopedics;  Laterality: Right;  . ORIF ULNAR FRACTURE  12/14/2011   Procedure: OPEN REDUCTION INTERNAL FIXATION (ORIF) ULNAR FRACTURE;  Surgeon: Meredith Pel, MD;  Location: [place];  Service: Orthopedics;  Laterality: Right;  Right ulnar shaft open reduction, internal fixation with right iliac crest bone graft  . TONSILLECTOMY      Medications: Prior to Admission medications   Medication Sig Start Date End Date Taking? Authorizing Provider  Cholecalciferol (VITAMIN D3) 2000 UNITS TABS Take 1 tablet by mouth daily.    [provider]  folic acid (FOLVITE) 1 MG tablet Take 1 tablet (1 mg total) by mouth daily. 05/01/17   Johnson, Clanford L, MD  levETIRAcetam (KEPPRA) 1000 MG tablet Take 1 tablet (1,000 mg total) by mouth 2 (two) times daily. 05/01/17   Johnson, Clanford L, MD  metoprolol succinate (TOPROL-XL) 25 MG 24 hr tablet Take 25 mg by mouth daily.    [provider]  Multiple Vitamin (MULTIVITAMIN WITH MINERALS) TABS tablet Take 1 tablet by mouth daily. 05/02/17   Johnson, Clanford L, MD  omeprazole (PRILOSEC) 40 MG capsule Take 40 mg by mouth daily.    [provider]  phosphorus (K PHOS NEUTRAL) 155-852-130 MG tablet Take 2 tablets (500 mg total) by mouth 3 (three) times daily. 05/02/17 05/16/17  Murlean Iba, MD  thiamine 100 MG tablet Take 1 tablet (100 mg total) by mouth daily. 05/02/17   Murlean Iba, MD    Allergies:   Allergies  Allergen Reactions  . Oxycodone Hcl Itching  . Acetaminophen Itching    Social History:  reports that she has been smoking Cigarettes.  She has a 7.50 pack-year smoking history. She has never used smokeless tobacco. She reports that she drinks about 3.6 oz of alcohol per week . She reports that she does not use drugs.  Family History: Family History  Problem Relation Age of Onset  . Cirrhosis Mother   . Diabetes Mellitus I Mother   . Esophageal  cancer Father     Physical Exam: Blood pressure (!) 131/92, pulse 97, temperature 98 F (36.7 C), temperature source Oral, resp. rate (!) 26, last menstrual period 07/12/2015, SpO2 100 %. General: Alert, awake, oriented x3, in no acute distress, Somewhat anxious and tremulous. Eyes: pink conjunctiva,anicteric sclera, pupils equal and reactive to light and accomodation, HEENT: normocephalic, atraumatic, oropharynx clear Neck: supple, no masses or lymphadenopathy, no goiter, no bruits, no JVD CVS: Regular rate and rhythm, without murmurs, rubs or gallops. No lower extremity edema Resp : Clear to auscultation bilaterally, no wheezing, rales or rhonchi. GI : Soft, nontender, nondistended, positive bowel sounds, no masses. No hepatomegaly. No hernia.  Musculoskeletal: No clubbing or cyanosis, positive pedal pulses. No contracture. ROM intact  Neuro: Grossly intact, no focal neurological deficits, strength 5/5 upper and lower extremities bilaterally, Psych: alert and oriented x 3, somewhat anxious Skin: no rashes or lesions, warm and dry   LABS on Admission: I have personally reviewed all the labs and imagings below    Basic Metabolic Panel:  Recent Labs Lab 05/15/17 1244 05/15/17 1432  NA 132*  --   K 2.2*  --   CL 91*  --   CO2 24  --   GLUCOSE 100*  --   BUN 12  --   CREATININE 0.88  --   CALCIUM 9.4  --   MG  --  1.6*   Liver Function Tests:  Recent Labs Lab 05/15/17 1432  AST 197*  ALT 63*  ALKPHOS 384*  BILITOT 1.3*  PROT 8.3*  ALBUMIN 3.4*   No results for input(s): LIPASE, AMYLASE in the last 168 hours. No results for input(s): AMMONIA in the last 168 hours. CBC:  Recent Labs Lab 05/15/17 1244  WBC 8.3  HGB 13.6  HCT 37.6  MCV 98.9  PLT 312   Cardiac Enzymes: No results for input(s): CKTOTAL, CKMB, CKMBINDEX, TROPONINI in the last 168 hours. BNP: Invalid input(s): POCBNP CBG: No results for input(s): GLUCAP in the last 168 hours.  Radiological  Exams on Admission:  Dg Chest 2 View  Result Date: 05/15/2017 CLINICAL DATA:  Chest pain and shortness of breath for 2 days. EXAM: CHEST  2 VIEW COMPARISON:  Chest x-ray dated April 29, 2017. FINDINGS: The cardiomediastinal silhouette is normal in size. Normal pulmonary vascularity. No focal consolidation, pleural effusion, or pneumothorax. No acute osseous abnormality. IMPRESSION: No active cardiopulmonary disease. Electronically Signed   By: Titus Dubin M.D.   On: 05/15/2017 13:11      EKG: Independently reviewed.Rate 118, accelerated junctional rhythm, QTC very prolonged 627   Assessment/Plan Principal Problem:   Seizure ([place]) - Unclear if she still has postictal effect from the prior seizures yesterday, currently very anxious - Neurology consulted, will DC Keppra -Per neurology, will likely place on Vimpat instead of Keppra - Seizure precautions, will place on Ativan as needed   Active Problems:   HYPERTENSION, BENIGN ESSENTIAL - Currently stable, will continue Toprol-XL  Severe hypokalemia with hypomagnesemia -Likely due to intractable nausea and vomiting - Placed on aggressive IV replacement, also give oral potassium replacement - Recheck potassium later today   GERD - will place on IV PPI until patient tolerating oral diet    Dehydration - place on IV fluid hydration with potassium replacement, full liquid diet and advance as tolerated  Prolonged QTC -  obtain EKG in a.m., QTC 627  - Avoid QTC prolonging medications  Elevated alcohol level  - Place on  CIWA protocol with Ativan, IV fluids, thiamine, folate   Transaminitis -AST elevated and ALT, with alcohol pattern, recheck LFTs in a.m.  - If no significant improvement, obtain right upper quadrant ultrasound    DVT prophylaxis:  Lovenox   CODE STATUS: Full CODE STATUS  Consults called:  neurology   Family Communication: Admission, patients condition and plan of care including tests being ordered have  been discussed with the patient  who indicates understanding and agree with the plan and Code Status  Admission status:  telemetry, observation   Disposition plan: Further plan will depend as patient's clinical course evolves and further radiologic and laboratory data become available.    At the time of admission, it appears that the appropriate admission status for this patient isobservation This is judged to be reasonable and necessary in order to provide the required intensity of service to ensure the patient's safety given the presenting symptoms severe dehydration, hypokalemia, hypomagnesemia, possible seizure,  physical exam findings, and initial radiographic and laboratory data in the context of their chronic comorbidities.  The  medical decision making on this patient was of high complexity and the patient is at high risk for clinical deterioration, therefore this is a level 3 visit.   Time Spent on Admission: 27mins    Tami Blass M.D. [facility] 05/15/2017, 3:46 PM Pager: [phone]  If 7PM-7AM, please contact night-coverage [email or web address] Password [email or web address]

## 2017-05-15 NOTE — ED Notes (Signed)
Abigail Butts, RN notified Dr. Bland Span of Troponin Level,

## 2017-05-15 NOTE — ED Provider Notes (Signed)
[facility] DEPT Provider Note   CSN: [id number] Arrival date & time: 05/15/17  1225     History   Chief Complaint Chief Complaint  Patient presents with  . Nausea  . Emesis  . Shortness of Breath  . Chest Pain    HPI Cynthia Welch is a 56 y.o. female.  HPI   56 yo F with PMHx as below with h/o HTN, seizures, subdural hematoma here with body aches, fatigue, and n/v. Pt was just seen and discharged followign seizures, hypokalemia. She was placed on keppra which she states causes nausea and vomiting. Since starting keppra she has had persistent, intractable vomiting. She has been unable to eat or drink. She has felt increasingly agitated, fatigued, and having diffuse cramps. No abdominal pain beyond when vomiting. No alleviating factors. She had one seizure yesterday.  Past Medical History:  Diagnosis Date  . Anxiety    none recently  . Asthma    as a child  . Fibroids   . Headache    reduced in last year  . Hypertension   . Pneumonia   . PONV (postoperative nausea and vomiting)    in the past "used to get nauseous"  . Seizures ([place])    none for 6 months ago (as of 10/04/16)  . Subdural hematoma ([place]) 01/29/2013   Per MRI 01/2013    Patient Active Problem List   Diagnosis Date Noted  . Seizure ([place]) 05/15/2017  . Dehydration 05/15/2017  . Protein-calorie malnutrition, severe 05/01/2017  . Hyponatremia 04/29/2017  . Syncope 04/29/2017  . Nausea & vomiting 04/29/2017  . Abdominal tenderness, epigastric 10/05/2016  . Esophageal reflux 10/05/2016  . S/P TAH (total abdominal hysterectomy) 09/07/2015  . Hypokalemia 01/29/2013  . Subdural hematoma ([place]) 01/29/2013  . Seizure disorder ([place]) 08/10/2010  . DENTAL CARIES 06/16/2010  . GANGLION CYST, WRIST, LEFT 01/26/2009  . TOBACCO ABUSE 11/19/2008  . LEUKOCYTOPENIA UNSPECIFIED 12/13/2007  . SKIN RASH 12/13/2007  . LIVER FUNCTION TESTS, ABNORMAL 12/13/2007  . HYPERCHOLESTEROLEMIA 10/31/2007  . HYPERTENSION,  BENIGN ESSENTIAL 10/30/2007  . FIBROIDS, UTERUS 09/26/2007    Past Surgical History:  Procedure Laterality Date  . ABDOMINAL HYSTERECTOMY N/A 09/07/2015   Procedure: HYSTERECTOMY ABDOMINAL, CYSTOTOMY WITH BLADDER REPAIR;  Surgeon: Mora Bellman, MD;  Location: [facility];  Service: Gynecology;  Laterality: N/A;  Requested 09/07/15 @ 2:15p  . BILATERAL SALPINGECTOMY Bilateral 09/07/2015   Procedure: BILATERAL SALPINGECTOMY;  Surgeon: Mora Bellman, MD;  Location: [facility];  Service: Gynecology;  Laterality: Bilateral;  . DIAGNOSTIC LAPAROSCOPY    . DILATION AND CURETTAGE OF UTERUS    . ESOPHAGOGASTRODUODENOSCOPY N/A 10/05/2016   Procedure: ESOPHAGOGASTRODUODENOSCOPY (EGD);  Surgeon: Wilford Corner, MD;  Location[facility];  Service: Endoscopy;  Laterality: N/A;  . ORIF ULNAR FRACTURE  09/03/2011   Procedure: OPEN REDUCTION INTERNAL FIXATION (ORIF) ULNAR FRACTURE;  Surgeon: Meredith Pel;  Location: [facility];  Service: Orthopedics;  Laterality: Right;  . ORIF ULNAR FRACTURE  12/14/2011   Procedure: OPEN REDUCTION INTERNAL FIXATION (ORIF) ULNAR FRACTURE;  Surgeon: Meredith Pel, MD;  Location: [facility];  Service: Orthopedics;  Laterality: Right;  Right ulnar shaft open reduction, internal fixation with right iliac crest bone graft  . TONSILLECTOMY      OB History    No data available       Home Medications    Prior to Admission medications   Medication Sig Start Date End Date Taking? Authorizing Provider  Cholecalciferol (VITAMIN D3) 2000 UNITS TABS Take 1 tablet by  mouth daily.    [provider]  folic acid (FOLVITE) 1 MG tablet Take 1 tablet (1 mg total) by mouth daily. 05/01/17   Johnson, Clanford L, MD  levETIRAcetam (KEPPRA) 1000 MG tablet Take 1 tablet (1,000 mg total) by mouth 2 (two) times daily. 05/01/17   Johnson, Clanford L, MD  metoprolol succinate (TOPROL-XL) 25 MG 24 hr tablet Take 25 mg by mouth daily.    [provider]  Multiple Vitamin  (MULTIVITAMIN WITH MINERALS) TABS tablet Take 1 tablet by mouth daily. 05/02/17   Johnson, Clanford L, MD  omeprazole (PRILOSEC) 40 MG capsule Take 40 mg by mouth daily.    [provider]  phosphorus (K PHOS NEUTRAL) 155-852-130 MG tablet Take 2 tablets (500 mg total) by mouth 3 (three) times daily. 05/02/17 05/16/17  Murlean Iba, MD  thiamine 100 MG tablet Take 1 tablet (100 mg total) by mouth daily. 05/02/17   Murlean Iba, MD    Family History Family History  Problem Relation Age of Onset  . Cirrhosis Mother   . Diabetes Mellitus I Mother   . Esophageal cancer Father     Social History Social History  Substance Use Topics  . Smoking status: Current Every Day Smoker    Packs/day: 0.50    Years: 15.00    Types: Cigarettes  . Smokeless tobacco: Never Used  . Alcohol use 3.6 oz/week    6 Cans of beer per week     Allergies   Oxycodone hcl and Acetaminophen   Review of Systems Review of Systems  Constitutional: Positive for fatigue.  Musculoskeletal: Positive for arthralgias and myalgias.  Neurological: Positive for weakness.     Physical Exam Updated Vital Signs BP (!) 131/92 (BP Location: Right Arm)   Pulse 97   Temp 98 F (36.7 C) (Oral)   Resp (!) 26   LMP 07/12/2015 (Approximate)   SpO2 100%   Physical Exam   ED Treatments / Results  Labs (all labs ordered are listed, but only abnormal results are displayed) Labs Reviewed  BASIC METABOLIC PANEL - Abnormal; Notable for the following:       Result Value   Sodium 132 (*)    Potassium 2.2 (*)    Chloride 91 (*)    Glucose, Bld 100 (*)    Anion gap 17 (*)    All other components within normal limits  CBC - Abnormal; Notable for the following:    RBC 3.80 (*)    MCH 35.8 (*)    MCHC 36.2 (*)    All other components within normal limits  HEPATIC FUNCTION PANEL - Abnormal; Notable for the following:    Total Protein 8.3 (*)    Albumin 3.4 (*)    AST 197 (*)    ALT 63 (*)     Alkaline Phosphatase 384 (*)    Total Bilirubin 1.3 (*)    All other components within normal limits  MAGNESIUM - Abnormal; Notable for the following:    Magnesium 1.6 (*)    All other components within normal limits  ETHANOL - Abnormal; Notable for the following:    Alcohol, Ethyl (B) 99 (*)    All other components within normal limits  URINALYSIS, ROUTINE W REFLEX MICROSCOPIC  I-STAT TROPONIN, ED    EKG  EKG Interpretation  Date/Time:  Tuesday May 15 2017 12:27:26 EDT Ventricular Rate:  118 PR Interval:    QRS Duration: 74 QT Interval:  448 QTC Calculation: 627 R  Axis:   62 Text Interpretation:  Accelerated Junctional rhythm Low voltage QRS Cannot rule out Anterior infarct , age undetermined Abnormal ECG Prolonged QTc Rate increased since last EKG Confirmed by Duffy Bruce [phone]) on 05/15/2017 3:06:24 PM       Radiology Dg Chest 2 View  Result Date: 05/15/2017 CLINICAL DATA:  Chest pain and shortness of breath for 2 days. EXAM: CHEST  2 VIEW COMPARISON:  Chest x-ray dated April 29, 2017. FINDINGS: The cardiomediastinal silhouette is normal in size. Normal pulmonary vascularity. No focal consolidation, pleural effusion, or pneumothorax. No acute osseous abnormality. IMPRESSION: No active cardiopulmonary disease. Electronically Signed   By: Titus Dubin M.D.   On: 05/15/2017 13:11    Procedures Procedures (including critical care time)  Medications Ordered in ED Medications  potassium chloride 10 mEq in 100 mL IVPB (not administered)  lactated ringers bolus 1,000 mL (not administered)  potassium chloride SA (K-DUR,KLOR-CON) CR tablet 40 mEq (not administered)  metoCLOPramide (REGLAN) injection 10 mg (not administered)  LORazepam (ATIVAN) injection 1 mg (not administered)  magnesium sulfate IVPB 2 g 50 mL (not administered)  potassium chloride 10 mEq in 100 mL IVPB (not administered)  0.9 % NaCl with KCl 20 mEq/ L  infusion (not administered)  thiamine (B-1)  injection 100 mg (not administered)  folic acid 1 mg in sodium chloride 0.9 % 50 mL IVPB (not administered)  lacosamide (VIMPAT) 200 mg in sodium chloride 0.9 % 25 mL IVPB (not administered)  lacosamide (VIMPAT) 100 mg in sodium chloride 0.9 % 25 mL IVPB (not administered)  zonisamide (ZONEGRAN) capsule 100 mg (not administered)     Initial Impression / Assessment and Plan / ED Course  I have reviewed the triage vital signs and the nursing notes.  Pertinent labs & imaging results that were available during my care of the patient were reviewed by me and considered in my medical decision making (see chart for details).    56 yo F with PMhx as above including recent admission for hypokalemia and seizures, EtOH abuse, here with severe dehydration 2/2 vomiting, which she feels is 2/2 her keppra. Pt also had break through seizure yesterday. On arrival pt tachycardic, dehydrated. Labs show marked hypokalemia, hypomagnesemia, and possible alcoholic hepatitis. Will replete fluids, lytes, and admit. D/w Neuro - will load with Vimpat 200 mg. Pt in agreement.  Final Clinical Impressions(s) / ED Diagnoses   Final diagnoses:  Hypokalemia  Hypomagnesemia  Dehydration  Breakthrough seizure ([place])    New Prescriptions New Prescriptions   No medications on file     Duffy Bruce, MD 05/15/17 [phone]

## 2017-05-15 NOTE — ED Notes (Signed)
Patient is stable and ready to be transport to the floor at this time.  Report was called to 5M RN.  Belongings taken with the patient to the floor.   

## 2017-05-15 NOTE — Consult Note (Signed)
NEURO HOSPITALIST CONSULT NOTE   Requestig physician: Dr. Tana Coast   Reason for Consult: seizure  History obtained from:  Patient     HPI:                                                                                                                                          Cynthia Welch is an 56 y.o. female with known seizure disorder who was recently on [place] in the past however due to side effects she was taken off. Patient states that the side effects included making her feel dizzy headed and not thinking clearly. Patient was recently seen in the hospital for seizure activity was placed back on Keppra and sent home on Keppra 1000 mg twice a day. She was also sent home on many other medications including multivitamin, folic acid, phosphorus, thiamine. Patient returns the hospital secondary to having a seizure in addition to having multiple episodes of emesis and a potassium as low as 2.2. Her current labs include a sodium of 132, potassium 2.2, chloride 91, magnesium 1.6, alkaline phosphatase 34, AST 197, ALT 63. Of note 2 days ago her AST was 81/98 and 2 years ago it was 18. ALT was also noted to be about half of what it is today on 04/30/2017.  Patient tells me she was taking multiple medications that she was sent home on and cannot truly state that it was of the [place] that was causing her to throw up however it was keeping her from keeping the [place] down. Initial thought process was to start her on Vimpat however patient does not have any insurance. Also has a history of alcohol abuse  Past Medical History:  Diagnosis Date  . Anxiety    none recently  . Asthma    as a child  . Fibroids   . Headache    reduced in last year  . Hypertension   . Pneumonia   . PONV (postoperative nausea and vomiting)    in the past "used to get nauseous"  . Seizures ([place])    none for 6 months ago (as of 10/04/16)  . Subdural hematoma ([place]) 01/29/2013   Per MRI 01/2013     Past Surgical History:  Procedure Laterality Date  . ABDOMINAL HYSTERECTOMY N/A 09/07/2015   Procedure: HYSTERECTOMY ABDOMINAL, CYSTOTOMY WITH BLADDER REPAIR;  Surgeon: Mora Bellman, MD;  Location: [facility];  Service: Gynecology;  Laterality: N/A;  Requested 09/07/15 @ 2:15p  . BILATERAL SALPINGECTOMY Bilateral 09/07/2015   Procedure: BILATERAL SALPINGECTOMY;  Surgeon: Mora Bellman, MD;  Location: [facility];  Service: Gynecology;  Laterality: Bilateral;  . DIAGNOSTIC LAPAROSCOPY    . DILATION AND CURETTAGE OF UTERUS    . ESOPHAGOGASTRODUODENOSCOPY N/A 10/05/2016  Procedure: ESOPHAGOGASTRODUODENOSCOPY (EGD);  Surgeon: Wilford Corner, MD;  Location: [facility];  Service: Endoscopy;  Laterality: N/A;  . ORIF ULNAR FRACTURE  09/03/2011   Procedure: OPEN REDUCTION INTERNAL FIXATION (ORIF) ULNAR FRACTURE;  Surgeon: Meredith Pel;  Location: [facility];  Service: Orthopedics;  Laterality: Right;  . ORIF ULNAR FRACTURE  12/14/2011   Procedure: OPEN REDUCTION INTERNAL FIXATION (ORIF) ULNAR FRACTURE;  Surgeon: Meredith Pel, MD;  Location: [facility];  Service: Orthopedics;  Laterality: Right;  Right ulnar shaft open reduction, internal fixation with right iliac crest bone graft  . TONSILLECTOMY      Family History  Problem Relation Age of Onset  . Cirrhosis Mother   . Diabetes Mellitus I Mother   . Esophageal cancer Father     Social History:  reports that she has been smoking Cigarettes.  She has a 7.50 pack-year smoking history. She has never used smokeless tobacco. She reports that she drinks about 3.6 oz of alcohol per week . She reports that she does not use drugs.  Allergies  Allergen Reactions  . Oxycodone Hcl Itching  . Acetaminophen Itching    MEDICATIONS:                                                                                                                     Current Facility-Administered Medications  Medication Dose Route Frequency Provider Last Rate Last Dose   . 0.9 % NaCl with KCl 20 mEq/ L  infusion   Intravenous Continuous Rai, Ripudeep K, MD      . folic acid 1 mg in sodium chloride 0.9 % 50 mL IVPB  1 mg Intravenous Once Duffy Bruce, MD      . lactated ringers bolus 1,000 mL  1,000 mL Intravenous Once Duffy Bruce, MD      . LORazepam (ATIVAN) injection 1 mg  1 mg Intravenous Once Duffy Bruce, MD      . magnesium sulfate IVPB 2 g 50 mL  2 g Intravenous Once Duffy Bruce, MD      . metoCLOPramide (REGLAN) injection 10 mg  10 mg Intravenous Once Duffy Bruce, MD      . potassium chloride 10 mEq in 100 mL IVPB  10 mEq Intravenous Once Duffy Bruce, MD      . potassium chloride 10 mEq in 100 mL IVPB  10 mEq Intravenous Q1 Hr x 3 Rai, Ripudeep K, MD      . potassium chloride SA (K-DUR,KLOR-CON) CR tablet 40 mEq  40 mEq Oral Once Duffy Bruce, MD      . thiamine (B-1) injection 100 mg  100 mg Intravenous Once Duffy Bruce, MD       Current Outpatient Prescriptions  Medication Sig Dispense Refill  . Cholecalciferol (VITAMIN D3) 2000 UNITS TABS Take 1 tablet by mouth daily.    . folic acid (FOLVITE) 1 MG tablet Take 1 tablet (1 mg total) by mouth daily. 30 tablet 0  . levETIRAcetam (KEPPRA) 1000 MG tablet Take 1 tablet (  1,000 mg total) by mouth 2 (two) times daily. 60 tablet 0  . metoprolol succinate (TOPROL-XL) 25 MG 24 hr tablet Take 25 mg by mouth daily.    . Multiple Vitamin (MULTIVITAMIN WITH MINERALS) TABS tablet Take 1 tablet by mouth daily.    Marland Kitchen omeprazole (PRILOSEC) 40 MG capsule Take 40 mg by mouth daily.    . phosphorus (K PHOS NEUTRAL) 155-852-130 MG tablet Take 2 tablets (500 mg total) by mouth 3 (three) times daily. 84 tablet 0  . thiamine 100 MG tablet Take 1 tablet (100 mg total) by mouth daily. 30 tablet 0    ROS:                                                                                                                                       History obtained from the patient  General ROS: negative for  - chills, fatigue, fever, night sweats, weight gain or weight loss Psychological ROS: negative for - behavioral disorder, hallucinations, memory difficulties, mood swings or suicidal ideation Ophthalmic ROS: negative for - blurry vision, double vision, eye pain or loss of vision ENT ROS: negative for - epistaxis, nasal discharge, oral lesions, sore throat, tinnitus or vertigo Allergy and Immunology ROS: negative for - hives or itchy/watery eyes Hematological and Lymphatic ROS: negative for - bleeding problems, bruising or swollen lymph nodes Endocrine ROS: negative for - galactorrhea, hair pattern changes, polydipsia/polyuria or temperature intolerance Respiratory ROS: negative for - cough, hemoptysis, shortness of breath or wheezing Cardiovascular ROS: negative for - chest pain, dyspnea on exertion, edema or irregular heartbeat Gastrointestinal ROS: positive for -  nausea/vomiting Genito-Urinary ROS: negative for - dysuria, hematuria, incontinence or urinary frequency/urgency Musculoskeletal ROS: negative for - joint swelling or muscular weakness Neurological ROS: as noted in HPI Dermatological ROS: negative for rash and skin lesion changes   Blood pressure (!) 131/92, pulse 97, temperature 98 F (36.7 C), temperature source Oral, resp. rate (!) 26, last menstrual period 07/12/2015, SpO2 100 %.   Neurologic Examination:                                                                                                      HEENT-  Normocephalic, no lesions, without obvious abnormality.  Normal external eye and conjunctiva.  Normal TM's bilaterally.  Normal auditory canals and external ears. Normal external nose, mucus membranes and septum.  Normal pharynx. Cardiovascular- S1, S2 normal, pulses palpable throughout   Lungs- chest clear, no wheezing, rales, normal symmetric  air entry Abdomen- normal findings: aorta normal Extremities- no edema Lymph-no adenopathy palpable Musculoskeletal-no  joint tenderness, deformity or swelling Skin-warm and dry, no hyperpigmentation, vitiligo, or suspicious lesions  Neurological Examination Mental Status: Alert, oriented, thought content appropriate.  Speech fluent without evidence of aphasia.  Able to follow 3 step commands without difficulty. Cranial Nerves: II: Discs flat bilaterally; Visual fields grossly normal,  III,IV, VI: ptosis not present, extra-ocular motions intact bilaterally pupils equal, round, reactive to light and accommodation V,VII: smile symmetric, facial light touch sensation normal bilaterally VIII: hearing normal bilaterally IX,X: uvula rises symmetrically XI: bilateral shoulder shrug XII: midline tongue extension Motor: Right : Upper extremity   5/5    Left:     Upper extremity   5/5  Lower extremity   5/5     Lower extremity   5/5 Tone and bulk:normal tone throughout; no atrophy noted Sensory: Pinprick and light touch intact throughout, bilaterally Deep Tendon Reflexes: 2+ and symmetric throughout Plantars: Right: downgoing   Left: downgoing Cerebellar: normal finger-to-nose, normal rapid alternating movements and normal heel-to-shin test Gait: normal gait and station   Lab Results: Basic Metabolic Panel:  Recent Labs Lab 05/15/17 1244 05/15/17 1432  NA 132*  --   K 2.2*  --   CL 91*  --   CO2 24  --   GLUCOSE 100*  --   BUN 12  --   CREATININE 0.88  --   CALCIUM 9.4  --   MG  --  1.6*    Liver Function Tests:  Recent Labs Lab 05/15/17 1432  AST 197*  ALT 63*  ALKPHOS 384*  BILITOT 1.3*  PROT 8.3*  ALBUMIN 3.4*   CBC:  Recent Labs Lab 05/15/17 1244  WBC 8.3  HGB 13.6  HCT 37.6  MCV 98.9  PLT 312    Imaging: Dg Chest 2 View  Result Date: 05/15/2017 CLINICAL DATA:  Chest pain and shortness of breath for 2 days. EXAM: CHEST  2 VIEW COMPARISON:  Chest x-ray dated April 29, 2017. FINDINGS: The cardiomediastinal silhouette is normal in size. Normal pulmonary vascularity. No focal  consolidation, pleural effusion, or pneumothorax. No acute osseous abnormality. IMPRESSION: No active cardiopulmonary disease. Electronically Signed   By: Titus Dubin M.D.   On: 05/15/2017 13:11    Assessment and plan per attending neurologist  Etta Quill PA-C [facility] [phone]  05/15/2017, 3:52 PM   Assessment/Plan: 56 year old female who is no seizure history. Patient was placed on Keppra and sent home recently. Apparently she was placed on many other medications and then started to have GI issues with emesis. Patient was unable to keep the [place] down and is unclear. Keppra was causing her nausea vomiting however she was unable to keep the medication down and does had a breakthrough seizure. Patient does not like the way the [place] makes her feel and thus refuses to take it. Initial thought process was to start her on Vimpat however she is unable to afford this due to no insurance. While in the hospital we will start her on zonisamide 100 mg daily for approximate 2 weeks and then we can increase her to 100 mg twice a day. In the interim while she is in the hospital we will start her on Vimpat with a load of 200 mg and a maintenance dose of  100 mg twice a day which can be stopped when she is discharged.  Plan: -Load patient with Vimpat 200 mg IV now -Continue Vimpat 100 mg  twice a day while in the hospital -Start Zonisaminde 100 mg daily for 2 weeks then increase to 100 mg BID -Obtain an MRI of the brain with and without contrast as she has no recent brain imaging and she is returned with more seizure activity. -follow up with out patient neurology upon discharge  NEUROHOSPITALIST ADDENDUM Seen and examined. Agree with H&P above.  Agree with recs. I have reviewed and modified the parts of the note to reflect my exam and assessment/plan. Taken the liberty to order the MRI. We'll follow the MRI results in the a.m. if available.  Amie Portland, MD [facility] [phone]  If 7pm to 7am, please call on call as listed on AMION.

## 2017-05-15 NOTE — ED Triage Notes (Addendum)
Pt in c/o n/v since starting new seizure medicine last week here, pt reports SOB, pt c/o mid CP & epigastric pain, pt reports continuous vomiting onsert x 2-3 days, pt speaks in full sentences, pt A&O x4

## 2017-05-15 NOTE — ED Notes (Signed)
Neurology at bedside.

## 2017-05-15 NOTE — ED Notes (Signed)
Admitting MD at bedside.

## 2017-05-16 ENCOUNTER — Observation Stay (HOSPITAL_COMMUNITY): Payer: Self-pay

## 2017-05-16 ENCOUNTER — Other Ambulatory Visit: Payer: Self-pay

## 2017-05-16 DIAGNOSIS — F10129 Alcohol abuse with intoxication, unspecified: Secondary | ICD-10-CM

## 2017-05-16 LAB — HEPATIC FUNCTION PANEL
ALBUMIN: 2.2 g/dL — AB (ref 3.5–5.0)
ALT: 43 U/L (ref 14–54)
AST: 111 U/L — ABNORMAL HIGH (ref 15–41)
Alkaline Phosphatase: 232 U/L — ABNORMAL HIGH (ref 38–126)
Bilirubin, Direct: 0.4 mg/dL (ref 0.1–0.5)
Indirect Bilirubin: 0.9 mg/dL (ref 0.3–0.9)
TOTAL PROTEIN: 5.4 g/dL — AB (ref 6.5–8.1)
Total Bilirubin: 1.3 mg/dL — ABNORMAL HIGH (ref 0.3–1.2)

## 2017-05-16 LAB — BASIC METABOLIC PANEL
Anion gap: 8 (ref 5–15)
BUN: 13 mg/dL (ref 6–20)
CHLORIDE: 99 mmol/L — AB (ref 101–111)
CO2: 25 mmol/L (ref 22–32)
CREATININE: 0.73 mg/dL (ref 0.44–1.00)
Calcium: 7.3 mg/dL — ABNORMAL LOW (ref 8.9–10.3)
Glucose, Bld: 91 mg/dL (ref 65–99)
POTASSIUM: 2.6 mmol/L — AB (ref 3.5–5.1)
SODIUM: 132 mmol/L — AB (ref 135–145)

## 2017-05-16 LAB — URINALYSIS, ROUTINE W REFLEX MICROSCOPIC
Glucose, UA: NEGATIVE mg/dL
Hgb urine dipstick: NEGATIVE
KETONES UR: 20 mg/dL — AB
NITRITE: NEGATIVE
PH: 6 (ref 5.0–8.0)
Protein, ur: 30 mg/dL — AB
Specific Gravity, Urine: 1.024 (ref 1.005–1.030)

## 2017-05-16 LAB — HEMOGLOBIN AND HEMATOCRIT, BLOOD
HCT: 29.2 % — ABNORMAL LOW (ref 36.0–46.0)
Hemoglobin: 10.2 g/dL — ABNORMAL LOW (ref 12.0–15.0)

## 2017-05-16 LAB — CBC
HEMATOCRIT: 26.3 % — AB (ref 36.0–46.0)
Hemoglobin: 9.2 g/dL — ABNORMAL LOW (ref 12.0–15.0)
MCH: 35 pg — ABNORMAL HIGH (ref 26.0–34.0)
MCHC: 35 g/dL (ref 30.0–36.0)
MCV: 100 fL (ref 78.0–100.0)
PLATELETS: 204 10*3/uL (ref 150–400)
RBC: 2.63 MIL/uL — AB (ref 3.87–5.11)
RDW: 12.6 % (ref 11.5–15.5)
WBC: 6.1 10*3/uL (ref 4.0–10.5)

## 2017-05-16 LAB — POTASSIUM
Potassium: 2.7 mmol/L — CL (ref 3.5–5.1)
Potassium: 3.4 mmol/L — ABNORMAL LOW (ref 3.5–5.1)

## 2017-05-16 MED ORDER — POTASSIUM CHLORIDE 10 MEQ/100ML IV SOLN
10.0000 meq | INTRAVENOUS | Status: DC
  Filled 2017-05-16 (×6): qty 100

## 2017-05-16 MED ORDER — GADOBENATE DIMEGLUMINE 529 MG/ML IV SOLN
15.0000 mL | Freq: Once | INTRAVENOUS | Status: AC
  Administered 2017-05-16: 15 mL via INTRAVENOUS

## 2017-05-16 MED ORDER — POTASSIUM CHLORIDE CRYS ER 20 MEQ PO TBCR
40.0000 meq | EXTENDED_RELEASE_TABLET | ORAL | Status: AC
  Administered 2017-05-16 (×4): 40 meq via ORAL
  Filled 2017-05-16 (×4): qty 2

## 2017-05-16 MED ORDER — POTASSIUM CHLORIDE CRYS ER 20 MEQ PO TBCR
40.0000 meq | EXTENDED_RELEASE_TABLET | Freq: Once | ORAL | Status: AC
  Administered 2017-05-16: 40 meq via ORAL
  Filled 2017-05-16: qty 2

## 2017-05-16 MED ORDER — MAGNESIUM SULFATE 2 GM/50ML IV SOLN
2.0000 g | Freq: Once | INTRAVENOUS | Status: AC
  Administered 2017-05-16: 2 g via INTRAVENOUS
  Filled 2017-05-16: qty 50

## 2017-05-16 MED ORDER — BOOST / RESOURCE BREEZE PO LIQD
1.0000 | Freq: Three times a day (TID) | ORAL | Status: DC
  Administered 2017-05-16 (×3): 1 via ORAL
  Filled 2017-05-16 (×9): qty 1

## 2017-05-16 NOTE — Progress Notes (Signed)
PROGRESS NOTE    Cynthia Welch  FXT:[id number] DOB: [birth year] DOA: 05/15/2017 PCP: Rogers Blocker, MD   Outpatient Specialists:     Brief Narrative:  Patient is a 56 year old female with history of seizures, alcohol use who was recently discharged from the hospital on 7/10 who had presented with seizure and was discharged on Keppra. Patient reported that she was okay for 1 or 2 days and then subsequently started having nausea and vomiting. She has been on Keppra once before and it causes nausea and vomiting. For the last 2-3 days she's been having persistent and intractable nausea and vomiting. She has been unable to eat or drink at all, states everything comes back up. She has been feeling very dizzy and lightheaded and states that she possibly had 1 seizure yesterday. She feels very frustrated, agitated and fatigued. Denied any fevers, chills, abdominal pain, hematemesis.   Assessment & Plan:   Principal Problem:   Seizure ([place]) Active Problems:   HYPERTENSION, BENIGN ESSENTIAL   Hypokalemia   Esophageal reflux   Nausea & vomiting   Dehydration   Alcohol abuse with intoxication ([place])   Seizure [facility]) - Neurology consulted, will DC Keppra -Per neurology:  1) Continue 100mg  Vimpat BID until discharge 2) Continue 100mg  Zonisamide x 13 more days followed by increase to 100mg  BID - Seizure precautions    HYPERTENSION, BENIGN ESSENTIAL - Currently stable, will continue Toprol-XL  Severe hypokalemia with hypomagnesemia -Likely due to intractable nausea and vomiting + alcohol use - replace PO -recheck this PM and in AM   GERD - PPI    Dehydration - eating well -d/c IVF  Prolonged QTC -  today's EKG pending  Elevated alcohol level  - Place on  CIWA protocol with Ativan, IV fluids, thiamine, folate   Transaminitis -suspect from alcohol use  Chronic white matter changes -from alcohol   DVT prophylaxis:  Lovenox   Code Status: Full Code   Family  Communication:   Disposition Plan:     Consultants:   neuro   Subjective: Asking for a more solid diet, no further N/V  Objective: Vitals:   05/15/17 1651 05/15/17 1813 05/15/17 2046 05/16/17 0211  BP: 129/88 115/77 115/81 128/72  Pulse: 84 84 90 86  Resp: 16 16 18 18   Temp: 98.4 F (36.9 C) 97.9 F (36.6 C) 99.5 F (37.5 C)   TempSrc: Oral Oral Oral   SpO2: 100% 100% 100% 98%    Intake/Output Summary (Last 24 hours) at 05/16/17 1347 Last data filed at 05/16/17 0636  Gross per 24 hour  Intake          1356.67 ml  Output                0 ml  Net          1356.67 ml   There were no vitals filed for this visit.  Examination:  General exam: Appears calm and comfortable  Respiratory system: Clear to auscultation. Respiratory effort normal. Cardiovascular system: S1 & S2 heard, RRR. No JVD, murmurs, rubs, gallops or clicks. No pedal edema. Gastrointestinal system: Abdomen is nondistended, soft and nontender. No organomegaly or masses felt. Normal bowel sounds heard. Central nervous system: Alert and oriented. No focal neurological deficits. Extremities: Symmetric 5 x 5 power. Skin: No rashes, lesions or ulcers Psychiatry: Judgement and insight appear normal. Mood & affect appropriate.     Data Reviewed: I have personally reviewed following labs and imaging studies  CBC:  Recent Labs Lab  05/15/17 1244 05/16/17 0644 05/16/17 0830  WBC 8.3 6.1  --   HGB 13.6 9.2* 10.2*  HCT 37.6 26.3* 29.2*  MCV 98.9 100.0  --   PLT 312 204  --    Basic Metabolic Panel:  Recent Labs Lab 05/15/17 1244 05/15/17 1432 05/15/17 1834 05/16/17 0644 05/16/17 0830  NA 132*  --   --  132*  --   K 2.2*  --  2.8* 2.6* 2.7*  CL 91*  --   --  99*  --   CO2 24  --   --  25  --   GLUCOSE 100*  --   --  91  --   BUN 12  --   --  13  --   CREATININE 0.88  --   --  0.73  --   CALCIUM 9.4  --   --  7.3*  --   MG  --  1.6*  --   --   --    GFR: CrCl cannot be calculated  (Unknown ideal weight.). Liver Function Tests:  Recent Labs Lab 05/15/17 1432 05/16/17 0644  AST 197* 111*  ALT 63* 43  ALKPHOS 384* 232*  BILITOT 1.3* 1.3*  PROT 8.3* 5.4*  ALBUMIN 3.4* 2.2*   No results for input(s): LIPASE, AMYLASE in the last 168 hours. No results for input(s): AMMONIA in the last 168 hours. Coagulation Profile: No results for input(s): INR, PROTIME in the last 168 hours. Cardiac Enzymes: No results for input(s): CKTOTAL, CKMB, CKMBINDEX, TROPONINI in the last 168 hours. BNP (last 3 results) No results for input(s): PROBNP in the last 8760 hours. HbA1C: No results for input(s): HGBA1C in the last 72 hours. CBG: No results for input(s): GLUCAP in the last 168 hours. Lipid Profile: No results for input(s): CHOL, HDL, LDLCALC, TRIG, CHOLHDL, LDLDIRECT in the last 72 hours. Thyroid Function Tests: No results for input(s): TSH, T4TOTAL, FREET4, T3FREE, THYROIDAB in the last 72 hours. Anemia Panel: No results for input(s): VITAMINB12, FOLATE, FERRITIN, TIBC, IRON, RETICCTPCT in the last 72 hours. Urine analysis:    Component Value Date/Time   COLORURINE AMBER (A) 05/16/2017 0504   APPEARANCEUR CLOUDY (A) 05/16/2017 0504   LABSPEC 1.024 05/16/2017 0504   PHURINE 6.0 05/16/2017 0504   GLUCOSEU NEGATIVE 05/16/2017 0504   HGBUR NEGATIVE 05/16/2017 0504   HGBUR negative 08/26/2010 1059   BILIRUBINUR SMALL (A) 05/16/2017 0504   KETONESUR 20 (A) 05/16/2017 0504   PROTEINUR 30 (A) 05/16/2017 0504   UROBILINOGEN 0.2 05/18/2015 2104   NITRITE NEGATIVE 05/16/2017 0504   LEUKOCYTESUR TRACE (A) 05/16/2017 0504     )No results found for this or any previous visit (from the past 240 hour(s)).    Anti-infectives    None       Radiology Studies: Dg Chest 2 View  Result Date: 05/15/2017 CLINICAL DATA:  Chest pain and shortness of breath for 2 days. EXAM: CHEST  2 VIEW COMPARISON:  Chest x-ray dated April 29, 2017. FINDINGS: The cardiomediastinal silhouette is  normal in size. Normal pulmonary vascularity. No focal consolidation, pleural effusion, or pneumothorax. No acute osseous abnormality. IMPRESSION: No active cardiopulmonary disease. Electronically Signed   By: Titus Dubin M.D.   On: 05/15/2017 13:11   Mr Jeri Cos WC Contrast  Result Date: 05/16/2017 CLINICAL DATA:  Seizure disorder. EXAM: MRI HEAD WITHOUT AND WITH CONTRAST TECHNIQUE: Multiplanar, multiecho pulse sequences of the brain and surrounding structures were obtained without and with intravenous contrast. CONTRAST:  39mL  MULTIHANCE GADOBENATE DIMEGLUMINE 529 MG/ML IV SOLN COMPARISON:  CT head without contrast 04/29/2017. MRI brain 01/28/2013 FINDINGS: Brain: Previously noted extra-axial collections have resolved. Mild atrophy and white matter changes are evident. No acute infarct, hemorrhage, or mass lesion is present. Dedicated imaging of the temporal lobes demonstrates normal size and signal of the hippocampal structures bilaterally. No mass lesion is present. The postcontrast images demonstrate no pathologic enhancement. Vascular: Normal flow voids. Skull and upper cervical spine: The skullbase is within normal limits. The craniocervical junction is normal. The upper cervical spine demonstrates mild degenerative change at C4-5. Marrow signal is normal. Midline sagittal structures are within normal limits. Sinuses/Orbits: The paranasal sinuses and mastoid air cells are clear. The globes and orbits are within normal limits. IMPRESSION: 1. Interval resolution of bilateral subdural collections. 2. Stable mild atrophy and white matter disease. This is mildly advanced for age. This may be related to alcohol abuse or chronic microvascular ischemia. 3. No acute intracranial abnormality. 4. No discrete seizure focus. Electronically Signed   By: San Morelle M.D.   On: 05/16/2017 11:07        Scheduled Meds: . cholecalciferol  2,000 Units Oral Daily  . enoxaparin (LOVENOX) injection  30 mg  Subcutaneous Q24H  . feeding supplement  1 Container Oral TID BM  . folic acid  1 mg Oral Daily  . LORazepam  1 mg Intravenous Once  . metoprolol succinate  25 mg Oral Daily  . multivitamin with minerals  1 tablet Oral Daily  . pantoprazole  40 mg Oral Daily  . phosphorus  500 mg Oral TID  . potassium chloride  40 mEq Oral Q2H  . thiamine  100 mg Oral Daily  . zonisamide  100 mg Oral Daily   Continuous Infusions: . lacosamide (VIMPAT) IV Stopped (05/16/17 0629)     LOS: 0 days    Time spent: 35 min    [place], DO [facility] Pager [phone]  If 7PM-7AM, please contact night-coverage [email or web address] Password [email or web address] 05/16/2017, 1:47 PM

## 2017-05-16 NOTE — Progress Notes (Addendum)
Subjective: Cynthia Welch is sitting comfortably in bed. In high spirits and feeling well today. No seizures overnight. Denies nausea, vomiting, headache, dizziness, blurry or double vision. Hands continue to be tremulous.   Current Pertinent Medications: Zonisamide: 100mg  daily Vimpat 100mg  q12h - weaning off before discharge  Pertinent Labs/Diagnostics: MR brain 7/25: 1. Interval resolution of bilateral subdural collections. 2. Stable mild atrophy and white matter disease. This is mildly advanced for age. 3. No acute intracranial abnormality.  Physical Examination: Vitals:   05/15/17 2046 05/16/17 0211  BP: 115/81 128/72  Pulse: 90 86  Resp: 18 18  Temp: 99.5 F (37.5 C)     General: WDWN female.  HEENT:  Normocephalic, no lesions, without obvious abnormality.  Normal external eye and conjunctiva.  Normal TM's bilaterally.  Normal auditory canals and external ears. Normal external nose, mucus membranes and septum.  Normal pharynx. Cardiovascular: S1, S2 normal, pulses palpable throughout   Pulmonary: chest clear, no wheezing, rales, normal symmetric air entry Abdomen: soft, non-tender Extremities: no joint deformities, effusion, or inflammation Musculoskeletal: no joint tenderness, deformity or swelling Skin: warm and dry, no hyperpigmentation, vitiligo, or suspicious lesions  Neurological Examination:  CN: Pupils are equal and round. They are symmetrically reactive from 3-->2 mm. EOMI without nystagmus. Facial sensation is intact to light touch. Face is symmetric at rest with normal strength and mobility. Hearing is intact to conversational voice. Palate elevates symmetrically and uvula is midline. Voice is normal in tone, pitch and quality. Bilateral SCM and trapezii are 4+/5. Tongue is midline with normal bulk and mobility.  Motor: Normal bulk, tone, and strength. 4/5 throughout BUE; 4-/5 BLE. Sensation: Intact to light touch.  DTRs: 2+, symmetric  Toes downgoing bilaterally.  No pathologic reflexes.  Coordination: Finger-to-nose and heel-to-shin are without dysmetria    Assessment: 56 year old female with known seizure disorder recently admitted with seizure second to medication noncompliance and multiple metabolic derangements. She has had no further seizure activity since being admitted. Because she has had a questionable averse reaction to Keppra, Ms. Merwin was switched to Vimpat, the cost of which is prohibitory. She has been placed on zonisamide and a vimpat bridge until discharge, at which time she will only take zonisamide. I provided her with a coupon for this medication and reminded her of the below precautions. Correction of metabolic derangements per primary team.  Recommendations: 1) Continue 100mg  Vimpat BID until discharge 2) Continue 100mg  Zonisamide x 13 more days followed by increase to 100mg  BID 3) Resolve metabolic abnormalities as needed per primary team   Per [facility] statutes, patients with seizures are not allowed to drive until they have been seizure-free for six months. Use caution when using heavy equipment or power tools. Avoid working on ladders or at heights. Take showers instead of baths. Ensure the water temperature is not too high on the home water heater. Do not go swimming alone. Maintain good sleep hygiene. Avoid alcohol. When caring for infants or small children, sit down when holding, feeding, or changing them to minimize risk of injury to the child in the event you have a seizure.   If Nandi Tonnesen has another seizure, call 911 and bring them back to the ED if:       A.  The seizure lasts longer than 5 minutes.            B.  The patient doesn't awaken shortly after the seizure       C.  The patient has  new problems such as difficulty seeing, speaking or moving following the seizure       D.  The patient was injured during the seizure       E.  The patient has a temperature over 102 F (39C)       F.  The  patient vomited during the seizure and now is having trouble breathing   Solon Augusta PA-C [facility] [phone]  05/16/2017, 9:24 AM   Neuro hospitalist addendum Patient seen and examined. Agree with the history and physical and exam. Agree with the recommendations above.  56 year old with known seizure disorders with breakthrough seizures, secondary to noncompliance. She has adverse reactions with Keppra-vomiting. Vimpat is unaffordable as an outpatient for her due to insurance reasons. Continue Vimpat while inpatient. Zonisamide 100 mg daily for 2 weeks followed by 100 mg twice a day followed by follow-up with outpatient neurologist is our final recommendation. Please call with questions.  Amie Portland, MD [facility] [phone]  If 7pm to 7am, please call on call as listed on AMION.

## 2017-05-17 DIAGNOSIS — E860 Dehydration: Secondary | ICD-10-CM

## 2017-05-17 DIAGNOSIS — K219 Gastro-esophageal reflux disease without esophagitis: Secondary | ICD-10-CM

## 2017-05-17 DIAGNOSIS — R112 Nausea with vomiting, unspecified: Secondary | ICD-10-CM

## 2017-05-17 DIAGNOSIS — E876 Hypokalemia: Secondary | ICD-10-CM

## 2017-05-17 DIAGNOSIS — G40919 Epilepsy, unspecified, intractable, without status epilepticus: Secondary | ICD-10-CM

## 2017-05-17 DIAGNOSIS — I10 Essential (primary) hypertension: Secondary | ICD-10-CM

## 2017-05-17 DIAGNOSIS — R569 Unspecified convulsions: Secondary | ICD-10-CM

## 2017-05-17 LAB — CBC
HEMATOCRIT: 27.8 % — AB (ref 36.0–46.0)
Hemoglobin: 9.4 g/dL — ABNORMAL LOW (ref 12.0–15.0)
MCH: 34.8 pg — AB (ref 26.0–34.0)
MCHC: 33.8 g/dL (ref 30.0–36.0)
MCV: 103 fL — AB (ref 78.0–100.0)
Platelets: 207 10*3/uL (ref 150–400)
RBC: 2.7 MIL/uL — ABNORMAL LOW (ref 3.87–5.11)
RDW: 13 % (ref 11.5–15.5)
WBC: 5.4 10*3/uL (ref 4.0–10.5)

## 2017-05-17 LAB — BASIC METABOLIC PANEL
Anion gap: 7 (ref 5–15)
BUN: 9 mg/dL (ref 6–20)
CHLORIDE: 106 mmol/L (ref 101–111)
CO2: 23 mmol/L (ref 22–32)
CREATININE: 0.59 mg/dL (ref 0.44–1.00)
Calcium: 7.6 mg/dL — ABNORMAL LOW (ref 8.9–10.3)
GFR calc Af Amer: >60 mL/min (ref >60)
GFR calc non Af Amer: >60 mL/min (ref >60)
GLUCOSE: 101 mg/dL — AB (ref 65–99)
Potassium: 3.7 mmol/L (ref 3.5–5.1)
SODIUM: 136 mmol/L (ref 135–145)

## 2017-05-17 LAB — MAGNESIUM: MAGNESIUM: 1.9 mg/dL (ref 1.7–2.4)

## 2017-05-17 MED ORDER — ZONISAMIDE 100 MG PO CAPS: ORAL_CAPSULE | ORAL | 0 refills | Status: DC

## 2017-05-17 MED ORDER — MAGNESIUM SULFATE IN D5W 1-5 GM/100ML-% IV SOLN
1.0000 g | Freq: Once | INTRAVENOUS | Status: AC
  Administered 2017-05-17: 1 g via INTRAVENOUS
  Filled 2017-05-17: qty 100

## 2017-05-17 MED ORDER — POTASSIUM CHLORIDE CRYS ER 20 MEQ PO TBCR
20.0000 meq | EXTENDED_RELEASE_TABLET | Freq: Once | ORAL | Status: AC
  Administered 2017-05-17: 20 meq via ORAL
  Filled 2017-05-17: qty 1

## 2017-05-17 NOTE — Discharge Instructions (Signed)

## 2017-05-17 NOTE — Discharge Summary (Signed)
Physician Discharge Summary  Cynthia Welch BSW:[id number] DOB: 11-11-1960  PCP: Rogers Blocker, MD  Admit date: 05/15/2017 Discharge date: 05/17/2017  Recommendations for Outpatient Follow-up:  1. Dr. Kevan Ny, PCP in 4 days with repeat labs (CBC, CMP, magnesium and EKG). [facility]: I have sent an ambulatory referral to their office to be seen in 4 weeks. As per patient, she apparently had been referred to their offices but before she could arrange the appointment, she was hospitalized again.  Home Health: None Equipment/Devices: None    Discharge Condition: Improved and stable  CODE STATUS: Full  Diet recommendation: Heart healthy diet.  Discharge Diagnoses:  Principal Problem:   Seizure (Lead) Active Problems:   HYPERTENSION, BENIGN ESSENTIAL   Hypokalemia   Esophageal reflux   Nausea & vomiting   Dehydration   Alcohol abuse with intoxication [facility])   Brief Summary: 56 year old female with PMH of seizures, alcohol use, HTN, does not drive, hospitalized 01/27/64-9/93/57 due to being found unresponsive on the couch by her spouse and diagnosed as seizures versus binge alcohol use (alcohol level: 260), workup of syncope was negative (carotid ultrasound, 2-D echo), neurology was consulted and recommended Keppra, now presented with intractable nausea and vomiting attributed to [place] with inability to keep any food or drinks down, feeling dizzy, lightheaded and possible seizure on day prior to admission. Neurology was consulted.  Assessment and plan:  1. Seizure disorder with breakthrough seizures: Suspect secondary to noncompliance. She reported adverse reactions with Keppra-vomiting & Keppra was DC'ed. Neurology was consulted. MRI results as below without acute findings. Discussed with them today. Neurology recommended Vimpat while inpatient but not at discharge due to unaffordability/insurance reasons and recommended Zonisamide 100 mg daily 2 weeks followed by  100 mg twice daily and outpatient neurology follow-up in 4 weeks. Patient denies seizures since admission. 2. Essential hypertension: Continue Toprol-XL. Thiazide diuretics discontinued on prior admission but still showed up on her home medication list. Discontinued thiazide diuretics due to issues with severe hyponatremia and hypokalemia. Controlled. 3. Severe hypokalemia: Discontinued thiazide diuretics. Nausea and vomiting on admission also contributed to it. Potassium was 2.2 on 7/24. 3.7 today. Give additional potassium supplements prior to discharge to try and keep potassium >4. Follow-up BMP closely as outpatient. 4. Hypomagnesemia: This was 1.6 on 7/24. Improved to 1.9 today. Give her additional dose of IV magnesium sulfate 1 g 1 to try and keep magnesium >2 due to hypokalemia and prolonged QTC. 5. GERD: Continue PPI. Tolerating diet without nausea, vomiting or diarrhea. 6. Dehydration: Secondary to GI losses. Resolved after IV fluids. Tolerating diet. 7. Prolonged QTC: EKG on admission showed QTC of 627. Potassium and magnesium replaced as indicated above. QTC 501 today. Minimize/avoid QT prolonging medications. Replace electrolytes as needed. Follow EKG in a few days as outpatient. 8. Alcohol abuse: No overt withdrawal. Continue thiamine, multivitamins and folate. Counseled regarding abstinence. Mild LFT abnormality, likely due to alcohol use. LFTs to be followed up as outpatient. 9. Anemia: Stable. Unclear etiology. Some of it may be dilutional. No bleeding reported. Follow CBCs as outpatient in a few days. 10. Asymptomatic bacteriuria. No antibiotics initiated.   Consultations:  Neurology  Procedures:  None   Discharge Instructions  Discharge Instructions    Ambulatory referral to Neurology    Complete by:  As directed    An appointment is requested in approximately: 4 weeks.   Call MD for:    Complete by:  As directed    Seizures.   Call MD  for:  persistant nausea and  vomiting    Complete by:  As directed    Diet - low sodium heart healthy    Complete by:  As directed    Discharge instructions    Complete by:  As directed    Patient is unable to drive, operate heavy machinery, perform activities at heights or participate in water activities until release by outpatient physician. This was discussed with the patient who expressed understanding.   Increase activity slowly    Complete by:  As directed        Medication List    STOP taking these medications   levETIRAcetam 750 MG tablet Commonly known as:  KEPPRA   phosphorus 155-852-130 MG tablet Commonly known as:  K PHOS NEUTRAL   triamterene-hydrochlorothiazide 37.5-25 MG tablet Commonly known as:  MAXZIDE-25     TAKE these medications   folic acid 1 MG tablet Commonly known as:  FOLVITE Take 1 tablet (1 mg total) by mouth daily.   metoprolol succinate 25 MG 24 hr tablet Commonly known as:  TOPROL-XL Take 25 mg by mouth daily.   multivitamin with minerals Tabs tablet Take 1 tablet by mouth daily.   omeprazole 40 MG capsule Commonly known as:  PRILOSEC Take 40 mg by mouth daily.   thiamine 100 MG tablet Take 1 tablet (100 mg total) by mouth daily.   Vitamin D3 2000 units Tabs Take 2,000 Units by mouth daily.   zonisamide 100 MG capsule Commonly known as:  ZONEGRAN By mouth, 100 mg daily for 2 weeks followed by 100 mg twice a day.      Follow-up Information    Rogers Blocker, MD. Schedule an appointment as soon as possible for a visit in 4 day(s).   Specialty:  Internal Medicine Why:  To be seen with repeat labs (CBC, BMP, magnesium and EKG). Contact information: [place] [address] [phone]        Neurology. Schedule an appointment as soon as possible for a visit.   Why:  Message has been sent to their office to call you with an appointment to be seen in 4 weeks. Please call them if you don't hear from them by mid next week. Contact  information: [facility] [address] [place] [address]         Allergies  Allergen Reactions  . Oxycodone Hcl Itching  . Acetaminophen Itching      Procedures/Studies: Dg Chest 2 View  Result Date: 05/15/2017 CLINICAL DATA:  Chest pain and shortness of breath for 2 days. EXAM: CHEST  2 VIEW COMPARISON:  Chest x-ray dated April 29, 2017. FINDINGS: The cardiomediastinal silhouette is normal in size. Normal pulmonary vascularity. No focal consolidation, pleural effusion, or pneumothorax. No acute osseous abnormality. IMPRESSION: No active cardiopulmonary disease. Electronically Signed   By: Titus Dubin M.D.   On: 05/15/2017 13:11   Mr Jeri Cos DD Contrast  Result Date: 05/16/2017 CLINICAL DATA:  Seizure disorder. EXAM: MRI HEAD WITHOUT AND WITH CONTRAST TECHNIQUE: Multiplanar, multiecho pulse sequences of the brain and surrounding structures were obtained without and with intravenous contrast. CONTRAST:  6mL MULTIHANCE GADOBENATE DIMEGLUMINE 529 MG/ML IV SOLN COMPARISON:  CT head without contrast 04/29/2017. MRI brain 01/28/2013 FINDINGS: Brain: Previously noted extra-axial collections have resolved. Mild atrophy and white matter changes are evident. No acute infarct, hemorrhage, or mass lesion is present. Dedicated imaging of the temporal lobes demonstrates normal size and signal of the  hippocampal structures bilaterally. No mass lesion is present. The postcontrast images demonstrate no pathologic enhancement. Vascular: Normal flow voids. Skull and upper cervical spine: The skullbase is within normal limits. The craniocervical junction is normal. The upper cervical spine demonstrates mild degenerative change at C4-5. Marrow signal is normal. Midline sagittal structures are within normal limits. Sinuses/Orbits: The paranasal sinuses and mastoid air cells are clear. The globes and orbits are within normal limits. IMPRESSION: 1. Interval resolution of  bilateral subdural collections. 2. Stable mild atrophy and white matter disease. This is mildly advanced for age. This may be related to alcohol abuse or chronic microvascular ischemia. 3. No acute intracranial abnormality. 4. No discrete seizure focus. Electronically Signed   By: San Morelle M.D.   On: 05/16/2017 11:07     Subjective: Seen this morning. Anxious to go home. Reports no seizures since admission. No nausea or vomiting. Tolerating diet. States that she does not drive. Lives with her husband. She was interviewed and examined in the presence of her female RN in the room.  Discharge Exam:  Vitals:   05/16/17 2109 05/17/17 0100 05/17/17 0507 05/17/17 1026  BP: 114/78 125/85 127/87 117/89  Pulse: 86 85 89 80  Resp: 18 18 18 19   Temp: 97.8 F (36.6 C) 98.5 F (36.9 C) 97.9 F (36.6 C) 97.7 F (36.5 C)  TempSrc: Oral Oral Oral Oral  SpO2: 100% 100% 100% 100%    General: Pleasant young female sitting up comfortably in bed. Cardiovascular: S1 & S2 heard, RRR, S1/S2 +. No murmurs, rubs, gallops or clicks. No JVD or pedal edema. Respiratory: Clear to auscultation without wheezing, rhonchi or crackles. No increased work of breathing. Abdominal:  Non distended, non tender & soft. No organomegaly or masses appreciated. Normal bowel sounds heard. CNS: Alert and oriented. No focal deficits. Extremities: no edema, no cyanosis    The results of significant diagnostics from this hospitalization (including imaging, microbiology, ancillary and laboratory) are listed below for reference.     Microbiology: Recent Results (from the past 240 hour(s))  Urine culture     Status: Abnormal (Preliminary result)   Collection Time: 05/16/17  5:04 AM  Result Value Ref Range Status   Specimen Description URINE, CLEAN CATCH  Final   Special Requests NONE  Final   Culture >=100,000 COLONIES/mL GRAM NEGATIVE RODS (A)  Final   Report Status PENDING  Incomplete     Labs: CBC:  Recent  Labs Lab 05/15/17 1244 05/16/17 0644 05/16/17 0830 05/17/17 0410  WBC 8.3 6.1  --  5.4  HGB 13.6 9.2* 10.2* 9.4*  HCT 37.6 26.3* 29.2* 27.8*  MCV 98.9 100.0  --  103.0*  PLT 312 204  --  644   Basic Metabolic Panel:  Recent Labs Lab 05/15/17 1244 05/15/17 1432 05/15/17 1834 05/16/17 0644 05/16/17 0830 05/16/17 1453 05/17/17 0410  NA 132*  --   --  132*  --   --  136  K 2.2*  --  2.8* 2.6* 2.7* 3.4* 3.7  CL 91*  --   --  99*  --   --  106  CO2 24  --   --  25  --   --  23  GLUCOSE 100*  --   --  91  --   --  101*  BUN 12  --   --  13  --   --  9  CREATININE 0.88  --   --  0.73  --   --  0.59  CALCIUM 9.4  --   --  7.3*  --   --  7.6*  MG  --  1.6*  --   --   --   --  1.9   Liver Function Tests:  Recent Labs Lab 05/15/17 1432 05/16/17 0644  AST 197* 111*  ALT 63* 43  ALKPHOS 384* 232*  BILITOT 1.3* 1.3*  PROT 8.3* 5.4*  ALBUMIN 3.4* 2.2*   Urinalysis    Component Value Date/Time   COLORURINE AMBER (A) 05/16/2017 0504   APPEARANCEUR CLOUDY (A) 05/16/2017 0504   LABSPEC 1.024 05/16/2017 0504   PHURINE 6.0 05/16/2017 0504   GLUCOSEU NEGATIVE 05/16/2017 0504   HGBUR NEGATIVE 05/16/2017 0504   HGBUR negative 08/26/2010 1059   BILIRUBINUR SMALL (A) 05/16/2017 0504   KETONESUR 20 (A) 05/16/2017 0504   PROTEINUR 30 (A) 05/16/2017 0504   UROBILINOGEN 0.2 05/18/2015 2104   NITRITE NEGATIVE 05/16/2017 0504   LEUKOCYTESUR TRACE (A) 05/16/2017 0504      Time coordinating discharge: Over 30 minutes  SIGNED:  Vernell Leep, MD, FACP, FHM. [facility] Pager [phone]  If 7PM-7AM, please contact night-coverage [email or web address] Password [facility] 05/17/2017, 10:55 AM

## 2017-05-17 NOTE — Progress Notes (Signed)
RN discussed discharge instructions with patient and patient's husband. They both verbalized understanding of seizure first aid, she is aware she has to take seizure medication tonight, states she will pick up at pharmacy. Discussed discontinuation of maxzide, hypokalemia. Patient understands to f/u with pcp and neurology. States she will make appt for f/u with pcp and for neurology if not called by next week.

## 2017-05-18 LAB — URINE CULTURE: Culture: >=100000 — AB

## 2017-05-18 LAB — CALCIUM, IONIZED: CALCIUM, IONIZED, SERUM: 4.6 mg/dL (ref 4.5–5.6)

## 2017-06-05 ENCOUNTER — Encounter (HOSPITAL_COMMUNITY): Payer: Self-pay

## 2017-06-05 ENCOUNTER — Inpatient Hospital Stay (HOSPITAL_COMMUNITY)
Admission: EM | Admit: 2017-06-05 | Discharge: 2017-06-09 | DRG: 417 | Disposition: A | Discharge status: Home / Self Care | Payer: Self-pay | Attending: Internal Medicine | Admitting: Internal Medicine

## 2017-06-05 ENCOUNTER — Emergency Department (HOSPITAL_COMMUNITY): Payer: Self-pay

## 2017-06-05 DIAGNOSIS — F10229 Alcohol dependence with intoxication, unspecified: Secondary | ICD-10-CM | POA: Diagnosis present

## 2017-06-05 DIAGNOSIS — I10 Essential (primary) hypertension: Secondary | ICD-10-CM | POA: Diagnosis present

## 2017-06-05 DIAGNOSIS — K219 Gastro-esophageal reflux disease without esophagitis: Secondary | ICD-10-CM | POA: Diagnosis present

## 2017-06-05 DIAGNOSIS — Z419 Encounter for procedure for purposes other than remedying health state, unspecified: Secondary | ICD-10-CM

## 2017-06-05 DIAGNOSIS — E43 Unspecified severe protein-calorie malnutrition: Secondary | ICD-10-CM | POA: Diagnosis present

## 2017-06-05 DIAGNOSIS — D7589 Other specified diseases of blood and blood-forming organs: Secondary | ICD-10-CM | POA: Diagnosis present

## 2017-06-05 DIAGNOSIS — R945 Abnormal results of liver function studies: Secondary | ICD-10-CM

## 2017-06-05 DIAGNOSIS — K852 Alcohol induced acute pancreatitis without necrosis or infection: Principal | ICD-10-CM | POA: Diagnosis present

## 2017-06-05 DIAGNOSIS — Z833 Family history of diabetes mellitus: Secondary | ICD-10-CM

## 2017-06-05 DIAGNOSIS — Z79899 Other long term (current) drug therapy: Secondary | ICD-10-CM

## 2017-06-05 DIAGNOSIS — R569 Unspecified convulsions: Secondary | ICD-10-CM

## 2017-06-05 DIAGNOSIS — E440 Moderate protein-calorie malnutrition: Secondary | ICD-10-CM | POA: Insufficient documentation

## 2017-06-05 DIAGNOSIS — E876 Hypokalemia: Secondary | ICD-10-CM | POA: Diagnosis present

## 2017-06-05 DIAGNOSIS — F17210 Nicotine dependence, cigarettes, uncomplicated: Secondary | ICD-10-CM | POA: Diagnosis present

## 2017-06-05 DIAGNOSIS — Z888 Allergy status to other drugs, medicaments and biological substances status: Secondary | ICD-10-CM

## 2017-06-05 DIAGNOSIS — R7989 Other specified abnormal findings of blood chemistry: Secondary | ICD-10-CM | POA: Diagnosis present

## 2017-06-05 DIAGNOSIS — K859 Acute pancreatitis without necrosis or infection, unspecified: Secondary | ICD-10-CM | POA: Diagnosis present

## 2017-06-05 DIAGNOSIS — F10129 Alcohol abuse with intoxication, unspecified: Secondary | ICD-10-CM | POA: Diagnosis present

## 2017-06-05 DIAGNOSIS — D539 Nutritional anemia, unspecified: Secondary | ICD-10-CM | POA: Diagnosis present

## 2017-06-05 DIAGNOSIS — Z90710 Acquired absence of both cervix and uterus: Secondary | ICD-10-CM

## 2017-06-05 DIAGNOSIS — Z90722 Acquired absence of ovaries, bilateral: Secondary | ICD-10-CM

## 2017-06-05 DIAGNOSIS — Z800 Family history of malignant neoplasm of digestive organs: Secondary | ICD-10-CM

## 2017-06-05 DIAGNOSIS — R109 Unspecified abdominal pain: Secondary | ICD-10-CM

## 2017-06-05 LAB — COMPREHENSIVE METABOLIC PANEL
ALBUMIN: 3 g/dL — AB (ref 3.5–5.0)
ALT: 41 U/L (ref 14–54)
AST: 118 U/L — AB (ref 15–41)
Alkaline Phosphatase: 221 U/L — ABNORMAL HIGH (ref 38–126)
Anion gap: 16 — ABNORMAL HIGH (ref 5–15)
BUN: 10 mg/dL (ref 6–20)
CHLORIDE: 103 mmol/L (ref 101–111)
CO2: 22 mmol/L (ref 22–32)
Calcium: 9 mg/dL (ref 8.9–10.3)
Creatinine, Ser: 0.85 mg/dL (ref 0.44–1.00)
GFR calc Af Amer: >60 mL/min (ref >60)
GFR calc non Af Amer: >60 mL/min (ref >60)
GLUCOSE: 81 mg/dL (ref 65–99)
POTASSIUM: 3.2 mmol/L — AB (ref 3.5–5.1)
Sodium: 141 mmol/L (ref 135–145)
Total Bilirubin: 1.7 mg/dL — ABNORMAL HIGH (ref 0.3–1.2)
Total Protein: 7.3 g/dL (ref 6.5–8.1)

## 2017-06-05 LAB — URINALYSIS, ROUTINE W REFLEX MICROSCOPIC
Bilirubin Urine: NEGATIVE
Glucose, UA: NEGATIVE mg/dL
HGB URINE DIPSTICK: NEGATIVE
Ketones, ur: 80 mg/dL — AB
Nitrite: POSITIVE — AB
PROTEIN: 30 mg/dL — AB
SPECIFIC GRAVITY, URINE: 1.044 — AB (ref 1.005–1.030)
pH: 6 (ref 5.0–8.0)

## 2017-06-05 LAB — I-STAT TROPONIN, ED: Troponin i, poc: 0 ng/mL (ref 0.00–0.08)

## 2017-06-05 LAB — CBC
HEMATOCRIT: 36.7 % (ref 36.0–46.0)
HEMOGLOBIN: 12.7 g/dL (ref 12.0–15.0)
MCH: 35.1 pg — ABNORMAL HIGH (ref 26.0–34.0)
MCHC: 34.6 g/dL (ref 30.0–36.0)
MCV: 101.4 fL — AB (ref 78.0–100.0)
Platelets: 265 10*3/uL (ref 150–400)
RBC: 3.62 MIL/uL — ABNORMAL LOW (ref 3.87–5.11)
RDW: 12.7 % (ref 11.5–15.5)
WBC: 8.8 10*3/uL (ref 4.0–10.5)

## 2017-06-05 LAB — I-STAT CG4 LACTIC ACID, ED
LACTIC ACID, VENOUS: 1.99 mmol/L — AB (ref 0.5–1.9)
LACTIC ACID, VENOUS: 1.84 mmol/L (ref 0.5–1.9)

## 2017-06-05 LAB — MAGNESIUM: Magnesium: 1.7 mg/dL (ref 1.7–2.4)

## 2017-06-05 LAB — LIPASE, BLOOD: LIPASE: 747 U/L — AB (ref 11–51)

## 2017-06-05 MED ORDER — MORPHINE SULFATE (PF) 4 MG/ML IV SOLN
6.0000 mg | Freq: Once | INTRAVENOUS | Status: AC
  Administered 2017-06-05: 6 mg via INTRAVENOUS
  Filled 2017-06-05: qty 2

## 2017-06-05 MED ORDER — IOPAMIDOL (ISOVUE-300) INJECTION 61%
100.0000 mL | Freq: Once | INTRAVENOUS | Status: AC | PRN
  Administered 2017-06-05: 100 mL via INTRAVENOUS

## 2017-06-05 MED ORDER — LORAZEPAM 2 MG/ML IJ SOLN: 0.5000 mg | Freq: Once | INTRAMUSCULAR | Status: DC

## 2017-06-05 MED ORDER — SODIUM CHLORIDE 0.9 % IV SOLN
Freq: Once | INTRAVENOUS | Status: AC
  Administered 2017-06-05: 19:00:00 via INTRAVENOUS

## 2017-06-05 MED ORDER — HYDROMORPHONE HCL 1 MG/ML IJ SOLN
1.0000 mg | INTRAMUSCULAR | Status: DC | PRN
Start: 2017-06-05 — End: 2017-06-09
  Administered 2017-06-05 – 2017-06-09 (×12): 1 mg via INTRAVENOUS
  Filled 2017-06-05 (×12): qty 1

## 2017-06-05 MED ORDER — IOPAMIDOL (ISOVUE-300) INJECTION 61%
INTRAVENOUS | Status: AC
  Filled 2017-06-05: qty 100

## 2017-06-05 MED ORDER — VITAMIN B-1 100 MG PO TABS: 100.0000 mg | ORAL_TABLET | Freq: Once | ORAL | Status: AC

## 2017-06-05 MED ORDER — THIAMINE HCL 100 MG PO TABS: 100.0000 mg | ORAL_TABLET | Freq: Every day | ORAL | Status: DC

## 2017-06-05 MED ORDER — ZONISAMIDE 100 MG PO CAPS
100.0000 mg | ORAL_CAPSULE | Freq: Two times a day (BID) | ORAL | Status: DC
  Administered 2017-06-05 – 2017-06-09 (×8): 100 mg via ORAL
  Filled 2017-06-05 (×9): qty 1

## 2017-06-05 MED ORDER — ONDANSETRON HCL 4 MG/2ML IJ SOLN
4.0000 mg | Freq: Once | INTRAMUSCULAR | Status: AC | PRN
  Administered 2017-06-05: 4 mg via INTRAVENOUS
  Filled 2017-06-05: qty 2

## 2017-06-05 MED ORDER — POTASSIUM CHLORIDE IN NACL 20-0.9 MEQ/L-% IV SOLN
INTRAVENOUS | Status: DC
  Administered 2017-06-05 – 2017-06-06 (×2): via INTRAVENOUS
  Filled 2017-06-05 (×3): qty 1000

## 2017-06-05 MED ORDER — FOLIC ACID 1 MG PO TABS
1.0000 mg | ORAL_TABLET | Freq: Every day | ORAL | Status: DC
  Administered 2017-06-06 – 2017-06-09 (×4): 1 mg via ORAL
  Filled 2017-06-05 (×4): qty 1

## 2017-06-05 MED ORDER — THIAMINE HCL 100 MG/ML IJ SOLN
100.0000 mg | Freq: Once | INTRAMUSCULAR | Status: AC
  Administered 2017-06-05: 100 mg via INTRAVENOUS
  Filled 2017-06-05: qty 2

## 2017-06-05 MED ORDER — SODIUM CHLORIDE 0.9 % IV BOLUS (SEPSIS)
1000.0000 mL | Freq: Once | INTRAVENOUS | Status: AC
  Administered 2017-06-05: 1000 mL via INTRAVENOUS

## 2017-06-05 MED ORDER — ENOXAPARIN SODIUM 40 MG/0.4ML ~~LOC~~ SOLN
40.0000 mg | SUBCUTANEOUS | Status: DC
  Administered 2017-06-06: 40 mg via SUBCUTANEOUS
  Filled 2017-06-05: qty 0.4

## 2017-06-05 MED ORDER — LORAZEPAM 2 MG/ML IJ SOLN
1.0000 mg | Freq: Four times a day (QID) | INTRAMUSCULAR | Status: AC | PRN
  Administered 2017-06-05: 1 mg via INTRAVENOUS
  Filled 2017-06-05: qty 1

## 2017-06-05 MED ORDER — ONDANSETRON HCL 4 MG/2ML IJ SOLN
4.0000 mg | Freq: Once | INTRAMUSCULAR | Status: AC | PRN
Start: 2017-06-05 — End: 2017-06-05
  Administered 2017-06-05: 4 mg via INTRAVENOUS
  Filled 2017-06-05: qty 2

## 2017-06-05 MED ORDER — GI COCKTAIL ~~LOC~~
30.0000 mL | Freq: Once | ORAL | Status: AC
  Administered 2017-06-05: 30 mL via ORAL
  Filled 2017-06-05: qty 30

## 2017-06-05 MED ORDER — LORAZEPAM 1 MG PO TABS
1.0000 mg | ORAL_TABLET | Freq: Four times a day (QID) | ORAL | Status: AC | PRN
  Administered 2017-06-06: 1 mg via ORAL
  Filled 2017-06-05: qty 1

## 2017-06-05 MED ORDER — VITAMIN B-1 100 MG PO TABS
100.0000 mg | ORAL_TABLET | Freq: Every day | ORAL | Status: DC
  Administered 2017-06-06 – 2017-06-09 (×4): 100 mg via ORAL
  Filled 2017-06-05 (×4): qty 1

## 2017-06-05 NOTE — ED Notes (Signed)
Prompted to provide urine sample 

## 2017-06-05 NOTE — ED Notes (Signed)
Attempted report x1. 

## 2017-06-05 NOTE — ED Provider Notes (Signed)
[facility] DEPT Provider Note   CSN: [id number] Arrival date & time: 06/05/17  1629     History   Chief Complaint Chief Complaint  Patient presents with  . Abdominal Pain    HPI Breta Demedeiros is a 56 y.o. female.  HPI Willow Shidler is a 56 y.o. female with history of anxiety, acid reflux, hypertension, seizures, presents to emergency department complaining of abdominal pain. Patient states that her pain started this morning around 10 AM. She states that it is hitting worse. Sharp severe pain comes and goes. It is located in the upper abdomen. It does not radiate. She reports associated nausea and vomiting. She states she was throwing up "green" earlier. Denies any bladder has no emesis. She has not had a bowel movement in several days which is not unusual for her. She denies any urinary symptoms. No fever or chills. She has history of hysterectomy, no other abdominal surgeries. She reports similar pain in the past but never this severe. Recent hospitalization for seizures. Does not feel like acid reflux.  Past Medical History:  Diagnosis Date  . Anxiety   . Childhood asthma   . Depression   . Fibroids   . GERD (gastroesophageal reflux disease)   . Headache    "monthly" (05/15/2017)  . High cholesterol   . Hypertension   . Pneumonia    "twice when I was young" (05/15/2017)  . PONV (postoperative nausea and vomiting)    in the past "used to get nauseous"  . Seizures ([place])    "I've had grand mal; usually hit the floor and wake up w/in a minute or so;last one was today, 05/15/2017"  . Subdural hematoma ([place]) 01/29/2013   Per MRI 01/2013; "had a seizure and busted my head open"    Patient Active Problem List   Diagnosis Date Noted  . Alcohol abuse with intoxication ([place]) 05/16/2017  . Seizure ([place]) 05/15/2017  . Dehydration 05/15/2017  . Protein-calorie malnutrition, severe 05/01/2017  . Hyponatremia 04/29/2017  . Syncope 04/29/2017  . Nausea & vomiting 04/29/2017   . Abdominal tenderness, epigastric 10/05/2016  . Esophageal reflux 10/05/2016  . S/P TAH (total abdominal hysterectomy) 09/07/2015  . Hypokalemia 01/29/2013  . Subdural hematoma ([place]) 01/29/2013  . Seizure disorder ([place]) 08/10/2010  . DENTAL CARIES 06/16/2010  . GANGLION CYST, WRIST, LEFT 01/26/2009  . TOBACCO ABUSE 11/19/2008  . LEUKOCYTOPENIA UNSPECIFIED 12/13/2007  . SKIN RASH 12/13/2007  . LIVER FUNCTION TESTS, ABNORMAL 12/13/2007  . HYPERCHOLESTEROLEMIA 10/31/2007  . HYPERTENSION, BENIGN ESSENTIAL 10/30/2007  . FIBROIDS, UTERUS 09/26/2007    Past Surgical History:  Procedure Laterality Date  . ABDOMINAL HYSTERECTOMY N/A 09/07/2015   Procedure: HYSTERECTOMY ABDOMINAL, CYSTOTOMY WITH BLADDER REPAIR;  Surgeon: Mora Bellman, MD;  Location: [facility];  Service: Gynecology;  Laterality: N/A;  Requested 09/07/15 @ 2:15p  . BILATERAL SALPINGECTOMY Bilateral 09/07/2015   Procedure: BILATERAL SALPINGECTOMY;  Surgeon: Mora Bellman, MD;  Location: [facility];  Service: Gynecology;  Laterality: Bilateral;  . DIAGNOSTIC LAPAROSCOPY    . DILATION AND CURETTAGE OF UTERUS    . ESOPHAGOGASTRODUODENOSCOPY N/A 10/05/2016   Procedure: ESOPHAGOGASTRODUODENOSCOPY (EGD);  Surgeon: Wilford Corner, MD;  Location: [facility];  Service: Endoscopy;  Laterality: N/A;  . FRACTURE SURGERY    . ORIF ULNAR FRACTURE  09/03/2011   Procedure: OPEN REDUCTION INTERNAL FIXATION (ORIF) ULNAR FRACTURE;  Surgeon: Meredith Pel;  Location: [facility];  Service: Orthopedics;  Laterality: Right;  . ORIF ULNAR FRACTURE  12/14/2011   Procedure: OPEN REDUCTION INTERNAL FIXATION (  ORIF) ULNAR FRACTURE;  Surgeon: Meredith Pel, MD;  Location: [facility];  Service: Orthopedics;  Laterality: Right;  Right ulnar shaft open reduction, internal fixation with right iliac crest bone graft  . TONSILLECTOMY      OB History    No data available       Home Medications    Prior to Admission medications   Medication Sig Start  Date End Date Taking? Authorizing Provider  Cholecalciferol (VITAMIN D3) 2000 UNITS TABS Take 2,000 Units by mouth daily.    Yes [provider]  folic acid (FOLVITE) 1 MG tablet Take 1 tablet (1 mg total) by mouth daily. 05/01/17  Yes Johnson, Clanford L, MD  omeprazole (PRILOSEC) 40 MG capsule Take 40 mg by mouth daily.   Yes [provider]  thiamine 100 MG tablet Take 1 tablet (100 mg total) by mouth daily. 05/02/17  Yes Johnson, Clanford L, MD  triamterene-hydrochlorothiazide (MAXZIDE-25) 37.5-25 MG tablet Take 1 tablet by mouth daily. 05/02/17  Yes [provider]  zonisamide (ZONEGRAN) 100 MG capsule By mouth, 100 mg daily for 2 weeks followed by 100 mg twice a day. 05/17/17  Yes Hongalgi, Lenis Dickinson, MD  metoprolol succinate (TOPROL-XL) 25 MG 24 hr tablet Take 25 mg by mouth daily.    [provider]  Multiple Vitamin (MULTIVITAMIN WITH MINERALS) TABS tablet Take 1 tablet by mouth daily. Patient not taking: Reported on 05/15/2017 05/02/17   Murlean Iba, MD    Family History Family History  Problem Relation Age of Onset  . Cirrhosis Mother   . Diabetes Mellitus I Mother   . Esophageal cancer Father     Social History Social History  Substance Use Topics  . Smoking status: Current Every Day Smoker    Packs/day: 0.10    Years: 35.00    Types: Cigarettes  . Smokeless tobacco: Never Used  . Alcohol use Yes     Comment: 05/15/2017 "last drink was ~2 wk ago"     Allergies   Oxycodone hcl and Acetaminophen   Review of Systems Review of Systems  Constitutional: Negative for chills and fever.  Respiratory: Negative for cough, chest tightness and shortness of breath.   Cardiovascular: Negative for chest pain, palpitations and leg swelling.  Gastrointestinal: Positive for abdominal pain, nausea and vomiting. Negative for diarrhea.  Genitourinary: Negative for dysuria, flank pain, pelvic pain, vaginal bleeding, vaginal discharge and vaginal  pain.  Musculoskeletal: Negative for arthralgias, myalgias, neck pain and neck stiffness.  Skin: Negative for rash.  Neurological: Negative for dizziness, weakness and headaches.  All other systems reviewed and are negative.    Physical Exam Updated Vital Signs BP 126/89 (BP Location: Right Arm)   Pulse 70   Temp 98.3 F (36.8 C) (Oral)   Resp (!) 25   Ht 5\' 8"  (1.727 m)   Wt 61.2 kg (135 lb)   LMP 07/12/2015 (Approximate)   SpO2 100%   BMI 20.53 kg/m   Physical Exam  Constitutional: She is oriented to person, place, and time. She appears well-developed and well-nourished.  Uncomfortable appearing  HENT:  Head: Normocephalic.  Eyes: Conjunctivae are normal.  Neck: Neck supple.  Cardiovascular: Normal rate, regular rhythm and normal heart sounds.   Pulmonary/Chest: Effort normal and breath sounds normal. No respiratory distress. She has no wheezes. She has no rales.  Abdominal: Soft. Bowel sounds are normal. She exhibits no distension. There is tenderness. There is no rebound.  Epigastric tenderness, left and right upper quadrant  tenderness  Musculoskeletal: She exhibits no edema.  Neurological: She is alert and oriented to person, place, and time.  Skin: Skin is warm and dry.  Psychiatric: She has a normal mood and affect. Her behavior is normal.  Nursing note and vitals reviewed.    ED Treatments / Results  Labs (all labs ordered are listed, but only abnormal results are displayed) Labs Reviewed  LIPASE, BLOOD - Abnormal; Notable for the following:       Result Value   Lipase 747 (*)    All other components within normal limits  COMPREHENSIVE METABOLIC PANEL - Abnormal; Notable for the following:    Potassium 3.2 (*)    Albumin 3.0 (*)    AST 118 (*)    Alkaline Phosphatase 221 (*)    Total Bilirubin 1.7 (*)    Anion gap 16 (*)    All other components within normal limits  CBC - Abnormal; Notable for the following:    RBC 3.62 (*)    MCV 101.4 (*)    MCH  35.1 (*)    All other components within normal limits  URINALYSIS, ROUTINE W REFLEX MICROSCOPIC - Abnormal; Notable for the following:    Specific Gravity, Urine 1.044 (*)    Ketones, ur 80 (*)    Protein, ur 30 (*)    Nitrite POSITIVE (*)    Leukocytes, UA TRACE (*)    Bacteria, UA MANY (*)    Squamous Epithelial / LPF 0-5 (*)    All other components within normal limits  I-STAT CG4 LACTIC ACID, ED - Abnormal; Notable for the following:    Lactic Acid, Venous 1.99 (*)    All other components within normal limits  URINE CULTURE  I-STAT TROPONIN, ED  I-STAT CG4 LACTIC ACID, ED    EKG  EKG Interpretation  Date/Time:  Tuesday June 05 2017 16:34:20 EDT Ventricular Rate:  68 PR Interval:    QRS Duration: 100 QT Interval:  502 QTC Calculation: 534 R Axis:   -2 Text Interpretation:  Sinus rhythm Low voltage, precordial leads Prolonged QT interval when compared to prior, longer QTc.  No STEMI Confirmed by Antony Blackbird [phone]) on 06/05/2017 5:11:26 PM       Radiology Ct Abdomen Pelvis W Contrast  Result Date: 06/05/2017 CLINICAL DATA:  Abdominal pain and vomiting. History of pancreatitis EXAM: CT ABDOMEN AND PELVIS WITH CONTRAST TECHNIQUE: Multidetector CT imaging of the abdomen and pelvis was performed using the standard protocol following bolus administration of intravenous contrast. CONTRAST:  173mL ISOVUE-300 IOPAMIDOL (ISOVUE-300) INJECTION 61% COMPARISON:  None. FINDINGS: Lower chest: Lung bases are clear. 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 Hepatobiliary: There is diffuse hepatic steatosis. No focal liver lesions are evident. There is cholelithiasis. Gallbladder wall does appreciably thickened. There is no biliary duct dilatation. Pancreas: There is peripancreatic fluid surrounding virtually the entire pancreas. Fluid extends into the left upper quadrant to abut a portion of the greater curvature of the stomach. There is mild loculated  fluid between the stomach and spleen in the left upper quadrant. Fluid tracks posteriorly on the left to the level of the anterior perinephric fascia. Fluid is also seen tracking into the lateral conal fascia on the left. There is subtle pancreatic edema throughout most of the pancreas. There is no pancreatic mass or pseudocyst. No pancreatic duct dilatation. Spleen: No splenic lesions are evident. There is a cleft in the lateral midportion of the spleen, either an anatomic  variant or residua of prior splenic trauma. Adrenals/Urinary tract: Adrenals appear normal bilaterally. Note that a small amount of peripancreatic fluid abuts the lateral limb of the left adrenal. There is a cyst arising from the posterior upper pole right kidney measuring 1.5 x 1.5 cm. There is a cyst in the medial right kidney upper portion measuring 1.0 x 1.0 cm. There is no hydronephrosis on either side. There is no renal or ureteral calculus on either side. Urinary bladder is midline with wall thickness within normal limits. Stomach/Bowel: There are scattered sigmoid diverticula without diverticulitis. There is no appreciable bowel wall or mesenteric thickening. No evident bowel obstruction. No free air or portal venous air. Vascular/Lymphatic: There are foci of atherosclerotic calcification in the aorta and common iliac arteries. No aneurysm evident. Major mesenteric vessels appear patent. There is no adenopathy in the abdomen or pelvis. Reproductive: Uterus is absent.  There is no evident pelvic mass. Other: Appendix appears unremarkable. There is no abscess in the abdomen or pelvis. Musculoskeletal: There is degenerative change in the lumbar spine with vacuum phenomenon at L4-5 and L5-S1. There are no blastic or lytic bone lesions. There is no intramuscular or abdominal wall lesions. IMPRESSION: 1. Acute pancreatitis with peripancreatic fluid and mild pancreatic edema. Fluid tracks into the left upper quadrant to abut the greater  curvature the stomach as well as demonstrated loculated fluid between the greater curvature of the stomach and spleen. Fluid tracks on the left into the retroperitoneum with fluid abutting the lateral limb of the adrenal and extending to the anterior perinephric fascia on the left. Fluid tracks more inferiorly into the left lateral conal fascia. There is no evident mass or pseudocyst. No appreciable pancreatic duct dilatation. 2.  Cholelithiasis.  No appreciable gallbladder wall thickening. 3.  Diffuse hepatic steatosis. 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 Question residua of old trauma versus anatomic variant. 5. No bowel obstruction. No abscess. Scattered sigmoid diverticula without diverticulitis. Appendix appears normal. 6.  No renal or ureteral calculus.  No hydronephrosis. 7.  Foci of aortoiliac atherosclerosis. Aortic Atherosclerosis (ICD10-I70.0). Electronically Signed   By: Lowella Grip III M.D.   On: 06/05/2017 19:49    Procedures Procedures (including critical care time)  Medications Ordered in ED Medications  sodium chloride 0.9 % bolus 1,000 mL (1,000 mLs Intravenous New Bag/Given 06/05/17 1728)  gi cocktail (Maalox,Lidocaine,Donnatal) (not administered)  ondansetron (ZOFRAN) injection 4 mg (4 mg Intravenous Given 06/05/17 1724)  morphine 4 MG/ML injection 6 mg (6 mg Intravenous Given 06/05/17 1743)     Initial Impression / Assessment and Plan / ED Course  I have reviewed the triage vital signs and the nursing notes.  Pertinent labs & imaging results that were available during my care of the patient were reviewed by me and considered in my medical decision making (see chart for details).     Patient emergency department with severe abdominal pain, nausea, vomiting. She appears to be very uncomfortable. Will give antiemetics, fluids. Will give pain medication. Labs ordered. Will get ultrasound, question whether this could be a biliary colic. If negative she will need CT scan for  further evaluation.  6:56 PM Patient's lipase elevated at 747. AST mildly elevated at 118. Bilirubin 1.7. Potassium 3.2. I went back and reassessed patient, she appears to be very shaky. We discussed alcohol use. Patient states that she used to drink heavily but now drinks less. She admits to drinking at least 1 drink daily. Sometimes more. I question whether she could have alcoholic  pancreatitis. We will check ultrasound to a CT scan. Her pain is improved with morphine. We'll continue IV fluids and monitor. We'll get CIWA.  8:47 PM CT showing pancreatitis. Gallbladder containing gallstones. Question gallstone pancreatitis vs alcoholic. Will admit to medicine for further evaluation and treament.   Vitals:   06/05/17 1634 06/05/17 1706 06/05/17 1858  BP: 126/89  (!) 140/93  Pulse: 70  73  Resp: (!) 25    Temp: 98.3 F (36.8 C)    TempSrc: Oral    SpO2: 100%    Weight:  61.2 kg (135 lb)   Height:  5\' 8"  (1.727 m)          Final Clinical Impressions(s) / ED Diagnoses   Final diagnoses:  Acute pancreatitis, unspecified complication status, unspecified pancreatitis type    New Prescriptions Current Discharge Medication List       Jeannett Senior, PA-C 06/05/17 2310    Tegeler, Gwenyth Allegra, MD 06/06/17 [phone]

## 2017-06-05 NOTE — ED Triage Notes (Signed)
Pt presents with onset of abdominal pain and vomiting that began at 10 this morning.  Pt reports gradual onset of chest pain that began later today.  Zofran 4mg  ODT given; PIV 20g L AC; NSR reported from EKG - EMS reports chest pain is reproducible.

## 2017-06-05 NOTE — Progress Notes (Signed)
Received report from ED RN, Raquel Sarna.

## 2017-06-05 NOTE — H&P (Signed)
History and Physical  Patient Name: Cynthia Welch     NWG:[id number]    DOB: [birth year]    DOA: 06/05/2017 PCP: Rogers Blocker, MD  Patient coming from: Home  Chief Complaint: Epigastric pain, vomiting      HPI: Cynthia Welch is a 56 y.o. female with a past medical history significant for alcoholism, seizures, and HTN who presents with acute epigastric pain and vomting.  The patient was recently admitted to the hospital with seizures again, her Keppra was changed to [place], which she has been taking as scheduled since then without problems.  Then this morning, she woke at 10AM with epigastic pain and nausea.  The pain was severe, radiated to the back, and constant. She had nonbilious, nonbloody vomiting numerous times over the course of the day, and the pain progressively worsened to the point that it was so severe she couldn't get up, so she called 9-1-1.  ED course: -Afebrile, heart rate 70, respirations 25, blood pressure 126/89, pulse ox 100% on room air -Na 141, K 3.2, Cr 0.85, WBC 8.8K, Hgb 12.7 and macrocytic -Lipase 747 -AST 118, alkaline phosphatase slightly elevated, total bilirubin 1.7 -Troponin negative -Lactate 1.99 -CT of abdomen and pelvis with contrast showed pancreatitis and cholelithiasis -ECG was unremarkable -She was given IV fluids and pain medicine and TRH was asked to evaluate for admission     She has been drinking again since discharge, states she and her husband share/finish a bottle of vodka daily.  She has started no new medicines besides zonisamide lately.  She states she has never had gallstones before.     ROS: Review of Systems  Constitutional: Negative for chills and fever.  Gastrointestinal: Positive for abdominal pain, nausea and vomiting.  All other systems reviewed and are negative.         Past Medical History:  Diagnosis Date  . Anxiety   . Childhood asthma   . Depression   . Fibroids   . GERD (gastroesophageal reflux  disease)   . Headache    "monthly" (05/15/2017)  . High cholesterol   . Hypertension   . Pneumonia    "twice when I was young" (05/15/2017)  . PONV (postoperative nausea and vomiting)    in the past "used to get nauseous"  . Seizures ([place])    "I've had grand mal; usually hit the floor and wake up w/in a minute or so;last one was today, 05/15/2017"  . Subdural hematoma ([place]) 01/29/2013   Per MRI 01/2013; "had a seizure and busted my head open"    Past Surgical History:  Procedure Laterality Date  . ABDOMINAL HYSTERECTOMY N/A 09/07/2015   Procedure: HYSTERECTOMY ABDOMINAL, CYSTOTOMY WITH BLADDER REPAIR;  Surgeon: Mora Bellman, MD;  Location: [facility];  Service: Gynecology;  Laterality: N/A;  Requested 09/07/15 @ 2:15p  . BILATERAL SALPINGECTOMY Bilateral 09/07/2015   Procedure: BILATERAL SALPINGECTOMY;  Surgeon: Mora Bellman, MD;  Location: [facility];  Service: Gynecology;  Laterality: Bilateral;  . DIAGNOSTIC LAPAROSCOPY    . DILATION AND CURETTAGE OF UTERUS    . ESOPHAGOGASTRODUODENOSCOPY N/A 10/05/2016   Procedure: ESOPHAGOGASTRODUODENOSCOPY (EGD);  Surgeon: Wilford Corner, MD;  Location: [facility]COPY;  Service: Endoscopy;  Laterality: N/A;  . FRACTURE SURGERY    . ORIF ULNAR FRACTURE  09/03/2011   Procedure: OPEN REDUCTION INTERNAL FIXATION (ORIF) ULNAR FRACTURE;  Surgeon: Meredith Pel;  Location: [facility];  Service: Orthopedics;  Laterality: Right;  . ORIF ULNAR FRACTURE  12/14/2011   Procedure: OPEN REDUCTION INTERNAL FIXATION (  ORIF) ULNAR FRACTURE;  Surgeon: Meredith Pel, MD;  Location: [facility];  Service: Orthopedics;  Laterality: Right;  Right ulnar shaft open reduction, internal fixation with right iliac crest bone graft  . TONSILLECTOMY      Social History: Patient lives with her husband.  The patient walks unassisted.  Smoker.  Drinks vodka daily.  From [place].  Disabled with seizures last three years.  Allergies  Allergen Reactions  . Oxycodone Hcl Itching  .  Acetaminophen Itching    Family history: family history includes Cirrhosis in her mother; Diabetes Mellitus I in her mother; Esophageal cancer in her father.  Prior to Admission medications   Medication Sig Start Date End Date Taking? Authorizing Provider  Cholecalciferol (VITAMIN D3) 2000 UNITS TABS Take 2,000 Units by mouth daily.    Yes [provider]  folic acid (FOLVITE) 1 MG tablet Take 1 tablet (1 mg total) by mouth daily. 05/01/17  Yes Johnson, Clanford L, MD  omeprazole (PRILOSEC) 40 MG capsule Take 40 mg by mouth daily.   Yes [provider]  thiamine 100 MG tablet Take 1 tablet (100 mg total) by mouth daily. 05/02/17  Yes Johnson, Clanford L, MD  triamterene-hydrochlorothiazide (MAXZIDE-25) 37.5-25 MG tablet Take 1 tablet by mouth daily. 05/02/17  Yes [provider]  zonisamide (ZONEGRAN) 100 MG capsule By mouth, 100 mg daily for 2 weeks followed by 100 mg twice a day. 05/17/17  Yes Hongalgi, Lenis Dickinson, MD       Physical Exam: BP (!) 138/96 (BP Location: Right Arm)   Pulse 72   Temp 98.1 F (36.7 C) (Oral)   Resp 17   Ht 5\' 8"  (1.727 m)   Wt 66 kg (145 lb 6.4 oz)   LMP 07/12/2015 (Approximate)   SpO2 100%   BMI 22.11 kg/m  General appearance: Well-developed, thin adult female, alert and in no acute distress, vomits occasionally.   Eyes: Anicteric, conjunctiva pink, lids and lashes normal. PERRL.    ENT: No nasal deformity, discharge, epistaxis.  Hearing normal. OP moist without lesions.   Neck: No neck masses.  Trachea midline.  No thyromegaly/tenderness. Lymph: No cervical or supraclavicular lymphadenopathy. Skin: Warm and dry.   No suspicious rashes or lesions. Cardiac: Tachycardic, regular, nl S1-S2, no murmurs appreciated.  Capillary refill is brisk.  JVP not visible.  No LE edema.  Radial and DP pulses 2+ and symmetric. Respiratory: Normal respiratory rate and rhythm.  CTAB without rales or wheezes. Abdomen: Abdomen soft.  Marked epigastric  and LUQ TTP, with voluntary guarding, no rebound. No ascites, distension, hepatosplenomegaly.   MSK: No deformities or effusions.  No cyanosis or clubbing. Neuro: Cranial nerves normal.  Sensation intact to light touch. Speech is fluent.  Muscle strength 5/5 and symmetric.  Mild tremor.    Psych: Sensorium intact and responding to questions, attention normal.  Behavior appropriate.  Affect normal.  Judgment and insight appear normal.     Labs on Admission:  I have personally reviewed following labs and imaging studies: CBC:  Recent Labs Lab 06/05/17 1736  WBC 8.8  HGB 12.7  HCT 36.7  MCV 101.4*  PLT 540   Basic Metabolic Panel:  Recent Labs Lab 06/05/17 1736  NA 141  K 3.2*  CL 103  CO2 22  GLUCOSE 81  BUN 10  CREATININE 0.85  CALCIUM 9.0   GFR: Estimated Creatinine Clearance: 76.3 mL/min (by C-G formula based on SCr of 0.85 mg/dL).  Liver Function Tests:  Recent Labs  Lab 06/05/17 1736  AST 118*  ALT 41  ALKPHOS 221*  BILITOT 1.7*  PROT 7.3  ALBUMIN 3.0*    Recent Labs Lab 06/05/17 1736  LIPASE 747*   Sepsis Labs: Lactic acid 1.99         Radiological Exams on Admission: Personally reviewed CT abdomen report: Ct Abdomen Pelvis W Contrast  Result Date: 06/05/2017 CLINICAL DATA:  Abdominal pain and vomiting. History of pancreatitis EXAM: CT ABDOMEN AND PELVIS WITH CONTRAST TECHNIQUE: Multidetector CT imaging of the abdomen and pelvis was performed using the standard protocol following bolus administration of intravenous contrast. CONTRAST:  189mL ISOVUE-300 IOPAMIDOL (ISOVUE-300) INJECTION 61% COMPARISON:  None. FINDINGS: Lower chest: Lung bases are clear. 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 Hepatobiliary: There is diffuse hepatic steatosis. No focal liver lesions are evident. There is cholelithiasis. Gallbladder wall does appreciably thickened. There is no biliary duct dilatation. Pancreas: There is  peripancreatic fluid surrounding virtually the entire pancreas. Fluid extends into the left upper quadrant to abut a portion of the greater curvature of the stomach. There is mild loculated fluid between the stomach and spleen in the left upper quadrant. Fluid tracks posteriorly on the left to the level of the anterior perinephric fascia. Fluid is also seen tracking into the lateral conal fascia on the left. There is subtle pancreatic edema throughout most of the pancreas. There is no pancreatic mass or pseudocyst. No pancreatic duct dilatation. Spleen: No splenic lesions are evident. There is a cleft in the lateral midportion of the spleen, either an anatomic variant or residua of prior splenic trauma. Adrenals/Urinary tract: Adrenals appear normal bilaterally. Note that a small amount of peripancreatic fluid abuts the lateral limb of the left adrenal. There is a cyst arising from the posterior upper pole right kidney measuring 1.5 x 1.5 cm. There is a cyst in the medial right kidney upper portion measuring 1.0 x 1.0 cm. There is no hydronephrosis on either side. There is no renal or ureteral calculus on either side. Urinary bladder is midline with wall thickness within normal limits. Stomach/Bowel: There are scattered sigmoid diverticula without diverticulitis. There is no appreciable bowel wall or mesenteric thickening. No evident bowel obstruction. No free air or portal venous air. Vascular/Lymphatic: There are foci of atherosclerotic calcification in the aorta and common iliac arteries. No aneurysm evident. Major mesenteric vessels appear patent. There is no adenopathy in the abdomen or pelvis. Reproductive: Uterus is absent.  There is no evident pelvic mass. Other: Appendix appears unremarkable. There is no abscess in the abdomen or pelvis. Musculoskeletal: There is degenerative change in the lumbar spine with vacuum phenomenon at L4-5 and L5-S1. There are no blastic or lytic bone lesions. There is no  intramuscular or abdominal wall lesions. IMPRESSION: 1. Acute pancreatitis with peripancreatic fluid and mild pancreatic edema. Fluid tracks into the left upper quadrant to abut the greater curvature the stomach as well as demonstrated loculated fluid between the greater curvature of the stomach and spleen. Fluid tracks on the left into the retroperitoneum with fluid abutting the lateral limb of the adrenal and extending to the anterior perinephric fascia on the left. Fluid tracks more inferiorly into the left lateral conal fascia. There is no evident mass or pseudocyst. No appreciable pancreatic duct dilatation. 2.  Cholelithiasis.  No appreciable gallbladder wall thickening. 3.  Diffuse hepatic steatosis. 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 Question residua of old trauma versus anatomic variant. 5. No bowel obstruction. No  abscess. Scattered sigmoid diverticula without diverticulitis. Appendix appears normal. 6.  No renal or ureteral calculus.  No hydronephrosis. 7.  Foci of aortoiliac atherosclerosis. Aortic Atherosclerosis (ICD10-I70.0). Electronically Signed   By: Lowella Grip III M.D.   On: 06/05/2017 19:49    EKG: Independently reviewed. Rate 68, QTc 534, no ST changes.  Echocardiogram July 2018: Report reviewed EF 55-60% Grade I DD  Endoscopy Dec 2017: Normal upper endoscopy, report reviewed      Assessment/Plan Principal Problem:   Acute pancreatitis Active Problems:   Elevated LFTs   Hypokalemia   Protein-calorie malnutrition, severe   Seizure ([place])   Alcohol abuse with intoxication ([place])   Macrocytic anemia  1. Acute pancreatitis, suspect mostly alcohol induced:  First episode.  Elevated LFTs.  Suspect cholelithiasis is incidental, but cannot rule out gallstones pancreatitis.  Zonegran not frequent cause of pancreatitis. -NPO and MIVF -Dilaudid IV for pain -Lorazepam PRN for now for nausea, given QTc -Alcohol and tobacco cessation recommended strongly -Obtain MRCP to  rule out gallstone -Trend LFTs   2. Hypokalemia:  From alcohol use. -Check mag -Supplelement K  3. Hypertension:  Normotensive on arrival.  States she has been taken off her antihypertensives.  4. Macrocytic anemia:  Likely from alcohol. Will check nutritional stores. -Check folate, B12  5. Prolonged QTc:  -Hold QT prolonging meds -Correct electrolytes -Repeat ECG tomrrow -Monitor on tele  6. Seizures:  No recent seizure activity -Continue Zonegran  7. Alcoholism:  -Cessation recommended, patient wants to quit, however, does not feel at all that she has dependence issues -SW consult for alcohol cessation resources -CIWA protocol with ondemand lorazepam -Thiamine and MVI          DVT prophylaxis: Lovenox  Code Status: FULL  Family Communication: None present  Disposition Plan: Anticipate IV fluids, conservative mgmt of pancreatitis.  Abstain from alcohol and rule out gallstone pancreatitis. Consults called: None Admission status: INPATIENT    Medical decision making: Patient seen at 8:45 PM on 06/05/2017.  The patient was discussed with Jeannett Senior, PA-C.  What exists of the patient's chart was reviewed in depth and summarized above.  Clinical condition: stable.        Edwin Dada [facility] Pager [phone]

## 2017-06-06 ENCOUNTER — Inpatient Hospital Stay (HOSPITAL_COMMUNITY): Payer: Self-pay

## 2017-06-06 DIAGNOSIS — R569 Unspecified convulsions: Secondary | ICD-10-CM

## 2017-06-06 DIAGNOSIS — F10129 Alcohol abuse with intoxication, unspecified: Secondary | ICD-10-CM

## 2017-06-06 DIAGNOSIS — K859 Acute pancreatitis without necrosis or infection, unspecified: Secondary | ICD-10-CM

## 2017-06-06 DIAGNOSIS — R7989 Other specified abnormal findings of blood chemistry: Secondary | ICD-10-CM

## 2017-06-06 DIAGNOSIS — D539 Nutritional anemia, unspecified: Secondary | ICD-10-CM

## 2017-06-06 DIAGNOSIS — K8020 Calculus of gallbladder without cholecystitis without obstruction: Secondary | ICD-10-CM

## 2017-06-06 DIAGNOSIS — E876 Hypokalemia: Secondary | ICD-10-CM

## 2017-06-06 LAB — COMPREHENSIVE METABOLIC PANEL
ALBUMIN: 2.4 g/dL — AB (ref 3.5–5.0)
ALT: 28 U/L (ref 14–54)
AST: 61 U/L — AB (ref 15–41)
Alkaline Phosphatase: 149 U/L — ABNORMAL HIGH (ref 38–126)
Anion gap: 10 (ref 5–15)
BUN: 11 mg/dL (ref 6–20)
CHLORIDE: 108 mmol/L (ref 101–111)
CO2: 24 mmol/L (ref 22–32)
CREATININE: 0.68 mg/dL (ref 0.44–1.00)
Calcium: 8.5 mg/dL — ABNORMAL LOW (ref 8.9–10.3)
GFR calc Af Amer: >60 mL/min (ref >60)
GFR calc non Af Amer: >60 mL/min (ref >60)
Glucose, Bld: 82 mg/dL (ref 65–99)
POTASSIUM: 3.2 mmol/L — AB (ref 3.5–5.1)
SODIUM: 142 mmol/L (ref 135–145)
Total Bilirubin: 1.3 mg/dL — ABNORMAL HIGH (ref 0.3–1.2)
Total Protein: 6 g/dL — ABNORMAL LOW (ref 6.5–8.1)

## 2017-06-06 LAB — CBC
HEMATOCRIT: 33.2 % — AB (ref 36.0–46.0)
HEMOGLOBIN: 10.9 g/dL — AB (ref 12.0–15.0)
MCH: 33.6 pg (ref 26.0–34.0)
MCHC: 32.8 g/dL (ref 30.0–36.0)
MCV: 102.5 fL — ABNORMAL HIGH (ref 78.0–100.0)
Platelets: 232 10*3/uL (ref 150–400)
RBC: 3.24 MIL/uL — ABNORMAL LOW (ref 3.87–5.11)
RDW: 12.9 % (ref 11.5–15.5)
WBC: 8.7 10*3/uL (ref 4.0–10.5)

## 2017-06-06 LAB — VITAMIN B12: Vitamin B-12: 984 pg/mL — ABNORMAL HIGH (ref 180–914)

## 2017-06-06 MED ORDER — ENOXAPARIN SODIUM 40 MG/0.4ML ~~LOC~~ SOLN
40.0000 mg | SUBCUTANEOUS | Status: DC
  Administered 2017-06-08 – 2017-06-09 (×2): 40 mg via SUBCUTANEOUS
  Filled 2017-06-06 (×2): qty 0.4

## 2017-06-06 MED ORDER — ADULT MULTIVITAMIN W/MINERALS CH
1.0000 | ORAL_TABLET | Freq: Every day | ORAL | Status: DC
  Administered 2017-06-06 – 2017-06-09 (×4): 1 via ORAL
  Filled 2017-06-06 (×4): qty 1

## 2017-06-06 MED ORDER — BOOST / RESOURCE BREEZE PO LIQD
1.0000 | Freq: Three times a day (TID) | ORAL | Status: DC
  Administered 2017-06-06 – 2017-06-09 (×7): 1 via ORAL

## 2017-06-06 MED ORDER — ENSURE ENLIVE PO LIQD: 237.0000 mL | Freq: Two times a day (BID) | ORAL | Status: DC

## 2017-06-06 MED ORDER — GADOBENATE DIMEGLUMINE 529 MG/ML IV SOLN
15.0000 mL | Freq: Once | INTRAVENOUS | Status: AC
  Administered 2017-06-06: 15 mL via INTRAVENOUS

## 2017-06-06 MED ORDER — POTASSIUM CHLORIDE IN NACL 40-0.9 MEQ/L-% IV SOLN
INTRAVENOUS | Status: DC
  Administered 2017-06-06 – 2017-06-07 (×4): 125 mL/h via INTRAVENOUS
  Administered 2017-06-08 – 2017-06-09 (×2): 75 mL/h via INTRAVENOUS
  Filled 2017-06-06 (×8): qty 1000

## 2017-06-06 NOTE — Progress Notes (Signed)
Initial Nutrition Assessment  DOCUMENTATION CODES:   Non-severe (moderate) malnutrition in context of chronic illness  INTERVENTION:   -Boost Breeze po TID, each supplement provides 250 kcal and 9 grams of protein -MVI daily -RD will follow for diet advancement and adjust supplement regimen as appropriate  NUTRITION DIAGNOSIS:   Malnutrition (MIld) related to chronic illness (ETOH abuse) as evidenced by percent weight loss, mild depletion of body fat, moderate depletion of body fat, mild depletion of muscle mass, moderate depletions of muscle mass, energy intake < 75% for > or equal to 1 month.  GOAL:   Patient will meet greater than or equal to 90% of their needs  MONITOR:   PO intake, Supplement acceptance, Diet advancement, Labs, Weight trends, Skin, I & O's  REASON FOR ASSESSMENT:   Malnutrition Screening Tool    ASSESSMENT:   Cynthia Welch is a 56 y.o. female with a past medical history significant for alcoholism, seizures, and HTN who presents with acute epigastric pain and vomting.  Pt admitted with acute pancreatitis.   Spoke with pt, who reports a general decline in health over the past 6 months. Pt shares that she no longer has the desire to eat solid foods and appetite has declined significantly. Pt shares that she mainly consumes liquids ("honestly, most of my calories come from beer").   Pt estimates an 80# wt loss over the past 6 months, related to poor oral intake. She estimated UBW of 175#. Reviewed wt hx, which revealed a 17.6% wt loss over the past 9 months, which is significant for time frame.   Pt's diet was just advanced to clear liquids and she is eager to eat. Pt reports consuming a soda prior to visit without difficulty. Pt recalls consuming Boost Breeze supplements during hospitalization and tolerating well. RD discussed importance of good meal completion and potential for diet advancement.   Nutrition-Focused physical exam completed. Findings are  mild to moderate fat depletion, mild to moderate muscle depletion, and no edema.   Medications reviewed and include folvite and vitamin B-1.  Labs reviewed: K: 3.2 (on IV supplementation).   Diet Order:  Diet clear liquid Room service appropriate? Yes; Fluid consistency: Thin Diet NPO time specified  Skin:  Reviewed, no issues  Last BM:  06/05/17  Height:   Ht Readings from Last 1 Encounters:  06/05/17 5\' 8"  (1.727 m)    Weight:   Wt Readings from Last 1 Encounters:  06/05/17 145 lb 6.4 oz (66 kg)    Ideal Body Weight:  63.6 kg  BMI:  Body mass index is 22.11 kg/m.  Estimated Nutritional Needs:   Kcal:  5697-9480  Protein:  90-105 grams  Fluid:  1.7-1.9 L  EDUCATION NEEDS:   Education needs addressed  Cynthia Welch A. Jimmye Norman, RD, LDN, CDE Pager: [phone] After hours Pager: [phone]

## 2017-06-06 NOTE — Progress Notes (Signed)
NURSING PROGRESS NOTE  Cynthia Welch [id number] Admission Data: 06/06/2017 12:59 AM Attending Provider: Edwin Dada, * NHA:[id number], Carolann Littler, MD Code Status: Full  Allergies:  Oxycodone hcl and Acetaminophen Past Medical History:   has a past medical history of Anxiety; Childhood asthma; Depression; Fibroids; GERD (gastroesophageal reflux disease); Headache; High cholesterol; Hypertension; Pneumonia; PONV (postoperative nausea and vomiting); Seizures ([place]); and Subdural hematoma ([place]) (01/29/2013). Past Surgical History:   has a past surgical history that includes ORIF ulnar fracture (09/03/2011); ORIF ulnar fracture (12/14/2011); Tonsillectomy; Diagnostic laparoscopy; Dilation and curettage of uterus; Abdominal hysterectomy (N/A, 09/07/2015); Bilateral salpingectomy (Bilateral, 09/07/2015); Esophagogastroduodenoscopy (N/A, 10/05/2016); and Fracture surgery. Social History:   reports that she has been smoking Cigarettes.  She has a 3.50 pack-year smoking history. She has never used smokeless tobacco. She reports that she drinks alcohol. She reports that she does not use drugs.  Cynthia Welch is a 56 y.o. female patient admitted from ED:   Last Documented Vital Signs: Blood pressure (!) 138/96, pulse 72, temperature 98.1 F (36.7 C), temperature source Oral, resp. rate 17, height 5\' 8"  (1.727 m), weight 66 kg (145 lb 6.4 oz), last menstrual period 07/12/2015, SpO2 100 %.  Cardiac Monitoring: Box # 6 in place. Cardiac monitor yields:normal sinus rhythm.  IV Fluids:  IV in place, occlusive dsg intact without redness, IV cath antecubital left, condition patent and no redness normal saline.   Skin: Appropriate for ethnicity, dry, and intact.  Patient orientated to room. Information packet given to patient. Admission inpatient armband information verified with patient to include name and date of birth and placed on patient arm. Side rails up x 2, fall assessment and education completed  with patient. Patient able to verbalize understanding of risk associated with falls and verbalized understanding to call for assistance before getting out of bed. Call light within reach. Patient able to voice and demonstrate understanding of unit orientation instructions.    Will continue to evaluate and treat per MD orders.  Clydell Hakim RN, BSN

## 2017-06-06 NOTE — Consult Note (Signed)
[facility] Surgery Consult Note  Cynthia Welch December 13, 1960  [id number].    Requesting MD: Starla Link  Chief Complaint/Reason for Consult: gallstone pancreatitis HPI: Patient is a 56 y/o female who presented to [facility] yesterday with severe epigastric pain radiating to the left side with bilious emesis and nausea. She has had a few other episodes of abdominal pain with nausea and vomiting that were less severe in the last 6 months. Over the last year has experienced decreased appetite and an unintentional weight loss of ~80 lbs. Denies fevers, chest pain, palpitations, SOB, diarrhea, blood in her stool, urinary symptoms. PMH significant for seizure disorder. She was taking Keppra, but recently switched her antiepileptic to zonisamide. Patient reports that she drinks 3-4 drinks with vodka daily, but has not been drinking the past few days. Smokes 1/2 ppd, denies illicit drug use.   Alk phos 149 from 221; AST 61 from 118; ALT 28 from 41; Tbili 1.3 from 1.7 Lipase 747 WBC 8.7, afebrile, VSS.   MRCP negative for choledocholithiasis; CT and MR show cholelithiasis without signs of cholecystitis  ROS: Review of Systems  Constitutional: Positive for chills and weight loss (80 lbs in the last yr unintentional ).  Respiratory: Negative for cough and shortness of breath.   Cardiovascular: Negative for chest pain and palpitations.  Gastrointestinal: Positive for abdominal pain, nausea and vomiting. Negative for blood in stool and diarrhea.  Genitourinary: Negative for dysuria, frequency and urgency.  Musculoskeletal: Positive for back pain.  Skin: Positive for itching.  Neurological: Positive for seizures (recently changed anti-epileptic medication). Negative for tingling and focal weakness.  Psychiatric/Behavioral: Positive for substance abuse (alcohol).  All other systems reviewed and are negative.   Family History  Problem Relation Age of Onset  . Cirrhosis Mother   . Diabetes Mellitus I Mother    . Esophageal cancer Father     Past Medical History:  Diagnosis Date  . Anxiety   . Childhood asthma   . Depression   . Fibroids   . GERD (gastroesophageal reflux disease)   . Headache    "monthly" (05/15/2017)  . High cholesterol   . Hypertension   . Pneumonia    "twice when I was young" (05/15/2017)  . PONV (postoperative nausea and vomiting)    in the past "used to get nauseous"  . Seizures ([place])    "I've had grand mal; usually hit the floor and wake up w/in a minute or so;last one was today, 05/15/2017"  . Subdural hematoma ([place]) 01/29/2013   Per MRI 01/2013; "had a seizure and busted my head open"    Past Surgical History:  Procedure Laterality Date  . ABDOMINAL HYSTERECTOMY N/A 09/07/2015   Procedure: HYSTERECTOMY ABDOMINAL, CYSTOTOMY WITH BLADDER REPAIR;  Surgeon: Mora Bellman, MD;  Location: [facility];  Service: Gynecology;  Laterality: N/A;  Requested 09/07/15 @ 2:15p  . BILATERAL SALPINGECTOMY Bilateral 09/07/2015   Procedure: BILATERAL SALPINGECTOMY;  Surgeon: Mora Bellman, MD;  Location: [facility];  Service: Gynecology;  Laterality: Bilateral;  . DIAGNOSTIC LAPAROSCOPY    . DILATION AND CURETTAGE OF UTERUS    . ESOPHAGOGASTRODUODENOSCOPY N/A 10/05/2016   Procedure: ESOPHAGOGASTRODUODENOSCOPY (EGD);  Surgeon: Wilford Corner, MD;  Location: [facility] ENDOSCOPY;  Service: Endoscopy;  Laterality: N/A;  . FRACTURE SURGERY    . ORIF ULNAR FRACTURE  09/03/2011   Procedure: OPEN REDUCTION INTERNAL FIXATION (ORIF) ULNAR FRACTURE;  Surgeon: Meredith Pel;  Location: [place];  Service: Orthopedics;  Laterality: Right;  . ORIF ULNAR FRACTURE  12/14/2011  Procedure: OPEN REDUCTION INTERNAL FIXATION (ORIF) ULNAR FRACTURE;  Surgeon: Meredith Pel, MD;  Location: [place];  Service: Orthopedics;  Laterality: Right;  Right ulnar shaft open reduction, internal fixation with right iliac crest bone graft  . TONSILLECTOMY      Social History:  reports that she has been smoking  Cigarettes.  She has a 3.50 pack-year smoking history. She has never used smokeless tobacco. She reports that she drinks alcohol. She reports that she does not use drugs.  Allergies:  Allergies  Allergen Reactions  . Oxycodone Hcl Itching  . Acetaminophen Itching    Medications Prior to Admission  Medication Sig Dispense Refill  . Cholecalciferol (VITAMIN D3) 2000 UNITS TABS Take 2,000 Units by mouth daily.     . folic acid (FOLVITE) 1 MG tablet Take 1 tablet (1 mg total) by mouth daily. 30 tablet 0  . omeprazole (PRILOSEC) 40 MG capsule Take 40 mg by mouth daily.    Marland Kitchen thiamine 100 MG tablet Take 1 tablet (100 mg total) by mouth daily. 30 tablet 0  . triamterene-hydrochlorothiazide (MAXZIDE-25) 37.5-25 MG tablet Take 1 tablet by mouth daily.  3  . zonisamide (ZONEGRAN) 100 MG capsule By mouth, 100 mg daily for 2 weeks followed by 100 mg twice a day. 60 capsule 0    Blood pressure 124/78, pulse 70, temperature (!) 97.5 F (36.4 C), temperature source Oral, resp. rate 17, height 5' 8"  (1.727 m), weight 66 kg (145 lb 6.4 oz), last menstrual period 07/12/2015, SpO2 100 %. Physical Exam: Physical Exam  Constitutional: She is oriented to person, place, and time. She appears well-developed and well-nourished. She is cooperative.  Non-toxic appearance. No distress.  HENT:  Head: Normocephalic and atraumatic.  Right Ear: External ear normal.  Left Ear: External ear normal.  Nose: Nose normal.  Mouth/Throat: Oropharynx is clear and moist and mucous membranes are normal.  Eyes: Pupils are equal, round, and reactive to light. Conjunctivae and lids are normal. No scleral icterus.  Neck: Normal range of motion. Neck supple. No thyromegaly present.  Cardiovascular: Normal rate, regular rhythm, S1 normal and S2 normal.   Pulses:      Radial pulses are 2+ on the right side, and 2+ on the left side.       Dorsalis pedis pulses are 2+ on the right side, and 2+ on the left side.  Pulmonary/Chest:  Effort normal and breath sounds normal.  Abdominal: Soft. Bowel sounds are normal. She exhibits no distension. There is no hepatosplenomegaly. There is tenderness in the epigastric area. There is no rigidity, no rebound, no guarding and negative Murphy's sign. No hernia.  Musculoskeletal:  Normal ROM in bilateral upper and lower extremities. No gross deformities.   Neurological: She is alert and oriented to person, place, and time. She has normal strength. No sensory deficit.  Skin: Skin is warm, dry and intact. No rash noted. She is not diaphoretic. No pallor.  Psychiatric: She has a normal mood and affect. Her behavior is normal. Judgment normal.    Results for orders placed or performed during the hospital encounter of 06/05/17 (from the past 48 hour(s))  Lipase, blood     Status: Abnormal   Collection Time: 06/05/17  5:36 PM  Result Value Ref Range   Lipase 747 (H) 11 - 51 U/L    Comment: RESULTS CONFIRMED BY MANUAL DILUTION  Comprehensive metabolic panel     Status: Abnormal   Collection Time: 06/05/17  5:36 PM  Result Value Ref  Range   Sodium 141 135 - 145 mmol/L   Potassium 3.2 (L) 3.5 - 5.1 mmol/L   Chloride 103 101 - 111 mmol/L   CO2 22 22 - 32 mmol/L   Glucose, Bld 81 65 - 99 mg/dL   BUN 10 6 - 20 mg/dL   Creatinine, Ser 0.85 0.44 - 1.00 mg/dL   Calcium 9.0 8.9 - 10.3 mg/dL   Total Protein 7.3 6.5 - 8.1 g/dL   Albumin 3.0 (L) 3.5 - 5.0 g/dL   AST 118 (H) 15 - 41 U/L   ALT 41 14 - 54 U/L   Alkaline Phosphatase 221 (H) 38 - 126 U/L   Total Bilirubin 1.7 (H) 0.3 - 1.2 mg/dL   GFR calc non Af Amer >60 >60 mL/min   GFR calc Af Amer >60 >60 mL/min    Comment: (NOTE) The eGFR has been calculated using the CKD EPI equation. This calculation has not been validated in all clinical situations. eGFR's persistently <60 mL/min signify possible Chronic Kidney Disease.    Anion gap 16 (H) 5 - 15  CBC     Status: Abnormal   Collection Time: 06/05/17  5:36 PM  Result Value Ref  Range   WBC 8.8 4.0 - 10.5 K/uL   RBC 3.62 (L) 3.87 - 5.11 MIL/uL   Hemoglobin 12.7 12.0 - 15.0 g/dL   HCT 36.7 36.0 - 46.0 %   MCV 101.4 (H) 78.0 - 100.0 fL   MCH 35.1 (H) 26.0 - 34.0 pg   MCHC 34.6 30.0 - 36.0 g/dL   RDW 12.7 11.5 - 15.5 %   Platelets 265 150 - 400 K/uL  Magnesium     Status: None   Collection Time: 06/05/17  5:36 PM  Result Value Ref Range   Magnesium 1.7 1.7 - 2.4 mg/dL  I-Stat Troponin, ED (not at [facility])     Status: None   Collection Time: 06/05/17  5:59 PM  Result Value Ref Range   Troponin i, poc 0.00 0.00 - 0.08 ng/mL   Comment 3            Comment: Due to the release kinetics of cTnI, a negative result within the first hours of the onset of symptoms does not rule out myocardial infarction with certainty. If myocardial infarction is still suspected, repeat the test at appropriate intervals.   I-Stat CG4 Lactic Acid, ED     Status: Abnormal   Collection Time: 06/05/17  6:01 PM  Result Value Ref Range   Lactic Acid, Venous 1.99 (H) 0.5 - 1.9 mmol/L  Urinalysis, Routine w reflex microscopic     Status: Abnormal   Collection Time: 06/05/17  8:05 PM  Result Value Ref Range   Color, Urine YELLOW YELLOW   APPearance CLEAR CLEAR   Specific Gravity, Urine 1.044 (H) 1.005 - 1.030   pH 6.0 5.0 - 8.0   Glucose, UA NEGATIVE NEGATIVE mg/dL   Hgb urine dipstick NEGATIVE NEGATIVE   Bilirubin Urine NEGATIVE NEGATIVE   Ketones, ur 80 (A) NEGATIVE mg/dL   Protein, ur 30 (A) NEGATIVE mg/dL   Nitrite POSITIVE (A) NEGATIVE   Leukocytes, UA TRACE (A) NEGATIVE   RBC / HPF 0-5 0 - 5 RBC/hpf   WBC, UA 6-30 0 - 5 WBC/hpf   Bacteria, UA MANY (A) NONE SEEN   Squamous Epithelial / LPF 0-5 (A) NONE SEEN   Mucous PRESENT   I-Stat CG4 Lactic Acid, ED     Status: None   Collection Time:  06/05/17  9:01 PM  Result Value Ref Range   Lactic Acid, Venous 1.84 0.5 - 1.9 mmol/L  CBC     Status: Abnormal   Collection Time: 06/06/17  6:02 AM  Result Value Ref Range   WBC 8.7 4.0 -  10.5 K/uL   RBC 3.24 (L) 3.87 - 5.11 MIL/uL   Hemoglobin 10.9 (L) 12.0 - 15.0 g/dL   HCT 33.2 (L) 36.0 - 46.0 %   MCV 102.5 (H) 78.0 - 100.0 fL   MCH 33.6 26.0 - 34.0 pg   MCHC 32.8 30.0 - 36.0 g/dL   RDW 12.9 11.5 - 15.5 %   Platelets 232 150 - 400 K/uL  Comprehensive metabolic panel     Status: Abnormal   Collection Time: 06/06/17  6:02 AM  Result Value Ref Range   Sodium 142 135 - 145 mmol/L   Potassium 3.2 (L) 3.5 - 5.1 mmol/L   Chloride 108 101 - 111 mmol/L   CO2 24 22 - 32 mmol/L   Glucose, Bld 82 65 - 99 mg/dL   BUN 11 6 - 20 mg/dL   Creatinine, Ser 0.68 0.44 - 1.00 mg/dL   Calcium 8.5 (L) 8.9 - 10.3 mg/dL   Total Protein 6.0 (L) 6.5 - 8.1 g/dL   Albumin 2.4 (L) 3.5 - 5.0 g/dL   AST 61 (H) 15 - 41 U/L   ALT 28 14 - 54 U/L   Alkaline Phosphatase 149 (H) 38 - 126 U/L   Total Bilirubin 1.3 (H) 0.3 - 1.2 mg/dL   GFR calc non Af Amer >60 >60 mL/min   GFR calc Af Amer >60 >60 mL/min    Comment: (NOTE) The eGFR has been calculated using the CKD EPI equation. This calculation has not been validated in all clinical situations. eGFR's persistently <60 mL/min signify possible Chronic Kidney Disease.    Anion gap 10 5 - 15  Vitamin B12     Status: Abnormal   Collection Time: 06/06/17  6:02 AM  Result Value Ref Range   Vitamin B-12 984 (H) 180 - 914 pg/mL    Comment: (NOTE) This assay is not validated for testing neonatal or myeloproliferative syndrome specimens for Vitamin B12 levels.    Ct Abdomen Pelvis W Contrast  Result Date: 06/05/2017 CLINICAL DATA:  Abdominal pain and vomiting. History of pancreatitis EXAM: CT ABDOMEN AND PELVIS WITH CONTRAST TECHNIQUE: Multidetector CT imaging of the abdomen and pelvis was performed using the standard protocol following bolus administration of intravenous contrast. CONTRAST:  128m ISOVUE-300 IOPAMIDOL (ISOVUE-300) INJECTION 61% COMPARISON:  None. FINDINGS: Lower chest: Lung bases are clear. There is a small foramen of [place]  hernia on the left containing only fat. Hepatobiliary: There is diffuse hepatic steatosis. No focal liver lesions are evident. There is cholelithiasis. Gallbladder wall does appreciably thickened. There is no biliary duct dilatation. Pancreas: There is peripancreatic fluid surrounding virtually the entire pancreas. Fluid extends into the left upper quadrant to abut a portion of the greater curvature of the stomach. There is mild loculated fluid between the stomach and spleen in the left upper quadrant. Fluid tracks posteriorly on the left to the level of the anterior perinephric fascia. Fluid is also seen tracking into the lateral conal fascia on the left. There is subtle pancreatic edema throughout most of the pancreas. There is no pancreatic mass or pseudocyst. No pancreatic duct dilatation. Spleen: No splenic lesions are evident. There is a cleft in the lateral midportion of the spleen, either an anatomic variant  or residua of prior splenic trauma. Adrenals/Urinary tract: Adrenals appear normal bilaterally. Note that a small amount of peripancreatic fluid abuts the lateral limb of the left adrenal. There is a cyst arising from the posterior upper pole right kidney measuring 1.5 x 1.5 cm. There is a cyst in the medial right kidney upper portion measuring 1.0 x 1.0 cm. There is no hydronephrosis on either side. There is no renal or ureteral calculus on either side. Urinary bladder is midline with wall thickness within normal limits. Stomach/Bowel: There are scattered sigmoid diverticula without diverticulitis. There is no appreciable bowel wall or mesenteric thickening. No evident bowel obstruction. No free air or portal venous air. Vascular/Lymphatic: There are foci of atherosclerotic calcification in the aorta and common iliac arteries. No aneurysm evident. Major mesenteric vessels appear patent. There is no adenopathy in the abdomen or pelvis. Reproductive: Uterus is absent.  There is no evident pelvic mass.  Other: Appendix appears unremarkable. There is no abscess in the abdomen or pelvis. Musculoskeletal: There is degenerative change in the lumbar spine with vacuum phenomenon at L4-5 and L5-S1. There are no blastic or lytic bone lesions. There is no intramuscular or abdominal wall lesions. IMPRESSION: 1. Acute pancreatitis with peripancreatic fluid and mild pancreatic edema. Fluid tracks into the left upper quadrant to abut the greater curvature the stomach as well as demonstrated loculated fluid between the greater curvature of the stomach and spleen. Fluid tracks on the left into the retroperitoneum with fluid abutting the lateral limb of the adrenal and extending to the anterior perinephric fascia on the left. Fluid tracks more inferiorly into the left lateral conal fascia. There is no evident mass or pseudocyst. No appreciable pancreatic duct dilatation. 2.  Cholelithiasis.  No appreciable gallbladder wall thickening. 3.  Diffuse hepatic steatosis. 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 Question residua of old trauma versus anatomic variant. 5. No bowel obstruction. No abscess. Scattered sigmoid diverticula without diverticulitis. Appendix appears normal. 6.  No renal or ureteral calculus.  No hydronephrosis. 7.  Foci of aortoiliac atherosclerosis. Aortic Atherosclerosis (ICD10-I70.0). Electronically Signed   By: Lowella Grip III M.D.   On: 06/05/2017 19:49   Mr 3d Recon At Scanner  Result Date: 06/06/2017 CLINICAL DATA:  Inpatient. Acute pancreatitis. Cholelithiasis. Abnormal liver function tests with mildly elevated total bilirubin level. EXAM: MRI ABDOMEN WITHOUT AND WITH CONTRAST (INCLUDING MRCP) TECHNIQUE: Multiplanar multisequence MR imaging of the abdomen was performed both before and after the administration of intravenous contrast. Heavily T2-weighted images of the biliary and pancreatic ducts were obtained, and three-dimensional MRCP images were rendered by post processing. CONTRAST:  17m MULTIHANCE  GADOBENATE DIMEGLUMINE 529 MG/ML IV SOLN COMPARISON:  06/05/2017 CT abdomen/pelvis. FINDINGS: Lower chest: Mild hypoventilatory changes in the dependent lung bases. Hepatobiliary: Normal liver size and configuration. Heterogeneous moderate to severe diffuse hepatic steatosis. Minimally complex 1.1 cm lateral segment left liver lobe cyst with thin internal septation. Additional scattered simple subcentimeter liver cysts. No suspicious liver masses. Multiple subcentimeter gallstones and sludge are seen layering in the nondistended gallbladder, with no gallbladder wall thickening or pericholecystic fluid. No biliary ductal dilatation. Common bile duct diameter 4 mm. No choledocholithiasis. No biliary strictures or masses. No ampullary mass. Pancreas: There is diffuse pancreatic parenchymal and peripancreatic edema, compatible with acute pancreatitis. No measurable peripancreatic fluid collections. Pancreatic parenchymal enhancement is preserved. No pancreatic mass or duct dilation. No pancreas divisum. Spleen: Normal size. No mass. Adrenals/Urinary Tract: Normal adrenals. No hydronephrosis. There is a 1.5 cm renal cortical mass  in the upper right kidney (series 1300/ image 46), which demonstrates relatively uniform precontrast T1 hyperintensity, heterogeneous T2 hypointensity and no convincing enhancement, compatible with a Bosniak category 2 hemorrhagic/ proteinaceous renal cyst. Additional scattered simple subcentimeter renal cysts are noted in the mid to upper right kidney. No suspicious renal masses. Stomach/Bowel: Grossly normal stomach. Visualized small and large bowel is normal caliber, with no bowel wall thickening. Vascular/Lymphatic: Atherosclerotic nonaneurysmal abdominal aorta. Patent portal, splenic, hepatic and renal veins. No pathologically enlarged lymph nodes in the abdomen. Other: Trace perihepatic and perisplenic ascites. Inflammatory edema tracks into the anterior paranephric retroperitoneal spaces  bilaterally. No focal fluid collections. Musculoskeletal: No aggressive appearing focal osseous lesions. IMPRESSION: 1. Acute uncomplicated pancreatitis. No measurable peripancreatic fluid collections. Trace perihepatic and perisplenic ascites. 2. Cholelithiasis. No evidence of acute cholecystitis. No biliary ductal dilatation. No choledocholithiasis. 3. Moderate to severe diffuse hepatic steatosis. 4. Small Bosniak category 2 hemorrhagic/proteinaceous upper right renal cyst. 5.  Aortic Atherosclerosis (ICD10-I70.0). Electronically Signed   By: Ilona Sorrel M.D.   On: 06/06/2017 11:58   Mr Abdomen Mrcp Moise Boring Contast  Result Date: 06/06/2017 CLINICAL DATA:  Inpatient. Acute pancreatitis. Cholelithiasis. Abnormal liver function tests with mildly elevated total bilirubin level. EXAM: MRI ABDOMEN WITHOUT AND WITH CONTRAST (INCLUDING MRCP) TECHNIQUE: Multiplanar multisequence MR imaging of the abdomen was performed both before and after the administration of intravenous contrast. Heavily T2-weighted images of the biliary and pancreatic ducts were obtained, and three-dimensional MRCP images were rendered by post processing. CONTRAST:  72m MULTIHANCE GADOBENATE DIMEGLUMINE 529 MG/ML IV SOLN COMPARISON:  06/05/2017 CT abdomen/pelvis. FINDINGS: Lower chest: Mild hypoventilatory changes in the dependent lung bases. Hepatobiliary: Normal liver size and configuration. Heterogeneous moderate to severe diffuse hepatic steatosis. Minimally complex 1.1 cm lateral segment left liver lobe cyst with thin internal septation. Additional scattered simple subcentimeter liver cysts. No suspicious liver masses. Multiple subcentimeter gallstones and sludge are seen layering in the nondistended gallbladder, with no gallbladder wall thickening or pericholecystic fluid. No biliary ductal dilatation. Common bile duct diameter 4 mm. No choledocholithiasis. No biliary strictures or masses. No ampullary mass. Pancreas: There is diffuse  pancreatic parenchymal and peripancreatic edema, compatible with acute pancreatitis. No measurable peripancreatic fluid collections. Pancreatic parenchymal enhancement is preserved. No pancreatic mass or duct dilation. No pancreas divisum. Spleen: Normal size. No mass. Adrenals/Urinary Tract: Normal adrenals. No hydronephrosis. There is a 1.5 cm renal cortical mass in the upper right kidney (series 1300/ image 46), which demonstrates relatively uniform precontrast T1 hyperintensity, heterogeneous T2 hypointensity and no convincing enhancement, compatible with a Bosniak category 2 hemorrhagic/ proteinaceous renal cyst. Additional scattered simple subcentimeter renal cysts are noted in the mid to upper right kidney. No suspicious renal masses. Stomach/Bowel: Grossly normal stomach. Visualized small and large bowel is normal caliber, with no bowel wall thickening. Vascular/Lymphatic: Atherosclerotic nonaneurysmal abdominal aorta. Patent portal, splenic, hepatic and renal veins. No pathologically enlarged lymph nodes in the abdomen. Other: Trace perihepatic and perisplenic ascites. Inflammatory edema tracks into the anterior paranephric retroperitoneal spaces bilaterally. No focal fluid collections. Musculoskeletal: No aggressive appearing focal osseous lesions. IMPRESSION: 1. Acute uncomplicated pancreatitis. No measurable peripancreatic fluid collections. Trace perihepatic and perisplenic ascites. 2. Cholelithiasis. No evidence of acute cholecystitis. No biliary ductal dilatation. No choledocholithiasis. 3. Moderate to severe diffuse hepatic steatosis. 4. Small Bosniak category 2 hemorrhagic/proteinaceous upper right renal cyst. 5.  Aortic Atherosclerosis (ICD10-I70.0). Electronically Signed   By: JIlona SorrelM.D.   On: 06/06/2017 11:58      Assessment/Plan Pancreatitis -  unsure of exact etiology - could be related to her gallstones vs. Alcoholism vs. New seizure medication - recheck a lipase - yesterday was  747; AM labs - NPO after midnight - will recheck in the AM and possibly post for laparoscopic cholecystectomy tomorrow Cholelithiasis - as above Hypokalemia HTN Macrocytic anemia Prolonged QTc Seizure disorder Alcohol abuse  FEN - NPO after MN; IVF VTE - hold lovenox tomorrow ID - no current abx   Brigid Re, [facility] Surgery 06/06/2017, 1:27 PM Pager: [phone] Consults: [phone] Mon-Fri 7:00 am-4:30 pm Sat-Sun 7:00 am-11:30 am

## 2017-06-06 NOTE — Progress Notes (Signed)
[name], female   DOB: 1961/04/20, 56 y.o.   MRN: [id number]  PROGRESS NOTE    Margareta Laureano  PZW:[id number] DOB: Feb 25, 1961 DOA: 06/05/2017 PCP: Rogers Blocker, MD   Brief Narrative:  56 year old female with history of alcohol abuse, seizures and hypertension presented with epigastric pain and vomiting. She was admitted with acute pancreatitis most recent alcohol started on IV fluids and IV analgesics. CT abdomen showed colitis and cholelithiasis. MRCP was ordered.   Assessment & Plan:   Principal Problem:   Acute pancreatitis Active Problems:   Elevated LFTs   Hypokalemia   Protein-calorie malnutrition, severe   Seizure (HCC)   Alcohol abuse with intoxication (HCC)   Macrocytic anemia   1. Acute pancreatitis, suspect mostly alcohol induced:  - CT abdomen showed cholelithiasis. MRCP has been ordered.   Zonegran not frequent cause of pancreatitis. -Continue IV fluids. Currently nothing by mouth. Might try to diagnose the evening if patient tolerates -Dilaudid IV for pain -Trend LFTs   2. Hypokalemia:  From alcohol use. -Supplelement potassium in IV fluids - Repeat a.m. Labs  3. Hypertension:  Monitor blood pressure.  States she has been taken off her antihypertensives.  4. Macrocytic anemia:  Likely from alcohol.  -Check folate, B12  5. Prolonged QTc:  -Hold QT prolonging meds - Monitor telemetry - Replace potassium. Repeat a.m. labs  6. Seizures:  No recent seizure activity -Continue Zonegran  7. Alcoholism:  -Cessation recommended, patient wants to quit, however, does not feel at all that she has dependence issues -SW consult for alcohol cessation resources -CIWA protocol with on demand lorazepam -Thiamine and folic acid  8. Cholelithiasis - General surgery consulted. Follow MRCP        DVT prophylaxis: Lovenox  Code Status: FULL  Family Communication: None present  Disposition Plan:  Home 1-2 days    Consultants:   general surgery Procedures:None  Antimicrobials: None Subjective: Patient seen and examined at bedside. He denies any current nausea or vomiting. She still complains of intermittent abdominal pain.  Objective: Vitals:   06/05/17 2230 06/05/17 2307 06/05/17 2338 06/06/17 0411  BP: 129/87 (!) 138/96  124/78  Pulse: 70 72  70  Resp: 17 17  17   Temp:  98.1 F (36.7 C)  (!) 97.5 F (36.4 C)  TempSrc:  Oral  Oral  SpO2: 100% 100%  100%  Weight:   66 kg (145 lb 6.4 oz)   Height:   5\' 8"  (1.727 m)     Intake/Output Summary (Last 24 hours) at 06/06/17 1319 Last data filed at 06/06/17 0440  Gross per 24 hour  Intake          2116.67 ml  Output                0 ml  Net          2116.67 ml   Filed Weights   06/05/17 1706 06/05/17 2338  Weight: 61.2 kg (135 lb) 66 kg (145 lb 6.4 oz)    Examination:  General exam: Appears calm and comfortable  Respiratory system: Bilateral decreased breath sound at bases  Cardiovascular system: S1 & S2 heard, Rate controlled  Gastrointestinal system: Abdomen is nondistended, soft and tender in the epigastric and right upper quadrant regions. Normal bowel sounds heard. Extremities: No cyanosis, clubbing, edema    Data Reviewed: I have personally reviewed following labs and imaging studies  CBC:  Recent Labs Lab 06/05/17 1736 06/06/17 0602  WBC 8.8 8.7  HGB  12.7 10.9*  HCT 36.7 33.2*  MCV 101.4* 102.5*  PLT 265 419   Basic Metabolic Panel:  Recent Labs Lab 06/05/17 1736 06/06/17 0602  NA 141 142  K 3.2* 3.2*  CL 103 108  CO2 22 24  GLUCOSE 81 82  BUN 10 11  CREATININE 0.85 0.68  CALCIUM 9.0 8.5*  MG 1.7  --    GFR: Estimated Creatinine Clearance: 81.1 mL/min (by C-G formula based on SCr of 0.68 mg/dL). Liver Function Tests:  Recent Labs Lab 06/05/17 1736 06/06/17 0602  AST 118* 61*  ALT 41 28  ALKPHOS 221* 149*  BILITOT 1.7* 1.3*  PROT 7.3 6.0*  ALBUMIN 3.0* 2.4*    Recent Labs Lab  06/05/17 1736  LIPASE 747*   No results for input(s): AMMONIA in the last 168 hours. Coagulation Profile: No results for input(s): INR, PROTIME in the last 168 hours. Cardiac Enzymes: No results for input(s): CKTOTAL, CKMB, CKMBINDEX, TROPONINI in the last 168 hours. BNP (last 3 results) No results for input(s): PROBNP in the last 8760 hours. HbA1C: No results for input(s): HGBA1C in the last 72 hours. CBG: No results for input(s): GLUCAP in the last 168 hours. Lipid Profile: No results for input(s): CHOL, HDL, LDLCALC, TRIG, CHOLHDL, LDLDIRECT in the last 72 hours. Thyroid Function Tests: No results for input(s): TSH, T4TOTAL, FREET4, T3FREE, THYROIDAB in the last 72 hours. Anemia Panel:  Recent Labs  06/06/17 0602  VITAMINB12 984*   Sepsis Labs:  Recent Labs Lab 06/05/17 1801 06/05/17 2101  LATICACIDVEN 1.99* 1.84    No results found for this or any previous visit (from the past 240 hour(s)).       Radiology Studies: Ct Abdomen Pelvis W Contrast  Result Date: 06/05/2017 CLINICAL DATA:  Abdominal pain and vomiting. History of pancreatitis EXAM: CT ABDOMEN AND PELVIS WITH CONTRAST TECHNIQUE: Multidetector CT imaging of the abdomen and pelvis was performed using the standard protocol following bolus administration of intravenous contrast. CONTRAST:  112mL ISOVUE-300 IOPAMIDOL (ISOVUE-300) INJECTION 61% COMPARISON:  None. FINDINGS: Lower chest: Lung bases are clear. 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 Hepatobiliary: There is diffuse hepatic steatosis. No focal liver lesions are evident. There is cholelithiasis. Gallbladder wall does appreciably thickened. There is no biliary duct dilatation. Pancreas: There is peripancreatic fluid surrounding virtually the entire pancreas. Fluid extends into the left upper quadrant to abut a portion of the greater curvature of the stomach. There is mild loculated fluid between the stomach and spleen  in the left upper quadrant. Fluid tracks posteriorly on the left to the level of the anterior perinephric fascia. Fluid is also seen tracking into the lateral conal fascia on the left. There is subtle pancreatic edema throughout most of the pancreas. There is no pancreatic mass or pseudocyst. No pancreatic duct dilatation. Spleen: No splenic lesions are evident. There is a cleft in the lateral midportion of the spleen, either an anatomic variant or residua of prior splenic trauma. Adrenals/Urinary tract: Adrenals appear normal bilaterally. Note that a small amount of peripancreatic fluid abuts the lateral limb of the left adrenal. There is a cyst arising from the posterior upper pole right kidney measuring 1.5 x 1.5 cm. There is a cyst in the medial right kidney upper portion measuring 1.0 x 1.0 cm. There is no hydronephrosis on either side. There is no renal or ureteral calculus on either side. Urinary bladder is midline with wall thickness within normal limits. Stomach/Bowel: There are  scattered sigmoid diverticula without diverticulitis. There is no appreciable bowel wall or mesenteric thickening. No evident bowel obstruction. No free air or portal venous air. Vascular/Lymphatic: There are foci of atherosclerotic calcification in the aorta and common iliac arteries. No aneurysm evident. Major mesenteric vessels appear patent. There is no adenopathy in the abdomen or pelvis. Reproductive: Uterus is absent.  There is no evident pelvic mass. Other: Appendix appears unremarkable. There is no abscess in the abdomen or pelvis. Musculoskeletal: There is degenerative change in the lumbar spine with vacuum phenomenon at L4-5 and L5-S1. There are no blastic or lytic bone lesions. There is no intramuscular or abdominal wall lesions. IMPRESSION: 1. Acute pancreatitis with peripancreatic fluid and mild pancreatic edema. Fluid tracks into the left upper quadrant to abut the greater curvature the stomach as well as demonstrated  loculated fluid between the greater curvature of the stomach and spleen. Fluid tracks on the left into the retroperitoneum with fluid abutting the lateral limb of the adrenal and extending to the anterior perinephric fascia on the left. Fluid tracks more inferiorly into the left lateral conal fascia. There is no evident mass or pseudocyst. No appreciable pancreatic duct dilatation. 2.  Cholelithiasis.  No appreciable gallbladder wall thickening. 3.  Diffuse hepatic steatosis. 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 Question residua of old trauma versus anatomic variant. 5. No bowel obstruction. No abscess. Scattered sigmoid diverticula without diverticulitis. Appendix appears normal. 6.  No renal or ureteral calculus.  No hydronephrosis. 7.  Foci of aortoiliac atherosclerosis. Aortic Atherosclerosis (ICD10-I70.0). Electronically Signed   By: Lowella Grip III M.D.   On: 06/05/2017 19:49   Mr 3d Recon At Scanner  Result Date: 06/06/2017 CLINICAL DATA:  Inpatient. Acute pancreatitis. Cholelithiasis. Abnormal liver function tests with mildly elevated total bilirubin level. EXAM: MRI ABDOMEN WITHOUT AND WITH CONTRAST (INCLUDING MRCP) TECHNIQUE: Multiplanar multisequence MR imaging of the abdomen was performed both before and after the administration of intravenous contrast. Heavily T2-weighted images of the biliary and pancreatic ducts were obtained, and three-dimensional MRCP images were rendered by post processing. CONTRAST:  70mL MULTIHANCE GADOBENATE DIMEGLUMINE 529 MG/ML IV SOLN COMPARISON:  06/05/2017 CT abdomen/pelvis. FINDINGS: Lower chest: Mild hypoventilatory changes in the dependent lung bases. Hepatobiliary: Normal liver size and configuration. Heterogeneous moderate to severe diffuse hepatic steatosis. Minimally complex 1.1 cm lateral segment left liver lobe cyst with thin internal septation. Additional scattered simple subcentimeter liver cysts. No suspicious liver masses. Multiple subcentimeter  gallstones and sludge are seen layering in the nondistended gallbladder, with no gallbladder wall thickening or pericholecystic fluid. No biliary ductal dilatation. Common bile duct diameter 4 mm. No choledocholithiasis. No biliary strictures or masses. No ampullary mass. Pancreas: There is diffuse pancreatic parenchymal and peripancreatic edema, compatible with acute pancreatitis. No measurable peripancreatic fluid collections. Pancreatic parenchymal enhancement is preserved. No pancreatic mass or duct dilation. No pancreas divisum. Spleen: Normal size. No mass. Adrenals/Urinary Tract: Normal adrenals. No hydronephrosis. There is a 1.5 cm renal cortical mass in the upper right kidney (series 1300/ image 46), which demonstrates relatively uniform precontrast T1 hyperintensity, heterogeneous T2 hypointensity and no convincing enhancement, compatible with a Bosniak category 2 hemorrhagic/ proteinaceous renal cyst. Additional scattered simple subcentimeter renal cysts are noted in the mid to upper right kidney. No suspicious renal masses. Stomach/Bowel: Grossly normal stomach. Visualized small and large bowel is normal caliber, with no bowel wall thickening. Vascular/Lymphatic: Atherosclerotic nonaneurysmal abdominal aorta. Patent portal, splenic, hepatic and renal veins. No pathologically enlarged lymph nodes in the abdomen. Other: Trace  perihepatic and perisplenic ascites. Inflammatory edema tracks into the anterior paranephric retroperitoneal spaces bilaterally. No focal fluid collections. Musculoskeletal: No aggressive appearing focal osseous lesions. IMPRESSION: 1. Acute uncomplicated pancreatitis. No measurable peripancreatic fluid collections. Trace perihepatic and perisplenic ascites. 2. Cholelithiasis. No evidence of acute cholecystitis. No biliary ductal dilatation. No choledocholithiasis. 3. Moderate to severe diffuse hepatic steatosis. 4. Small Bosniak category 2 hemorrhagic/proteinaceous upper right renal  cyst. 5.  Aortic Atherosclerosis (ICD10-I70.0). Electronically Signed   By: Ilona Sorrel M.D.   On: 06/06/2017 11:58   Mr Abdomen Mrcp Moise Boring Contast  Result Date: 06/06/2017 CLINICAL DATA:  Inpatient. Acute pancreatitis. Cholelithiasis. Abnormal liver function tests with mildly elevated total bilirubin level. EXAM: MRI ABDOMEN WITHOUT AND WITH CONTRAST (INCLUDING MRCP) TECHNIQUE: Multiplanar multisequence MR imaging of the abdomen was performed both before and after the administration of intravenous contrast. Heavily T2-weighted images of the biliary and pancreatic ducts were obtained, and three-dimensional MRCP images were rendered by post processing. CONTRAST:  33mL MULTIHANCE GADOBENATE DIMEGLUMINE 529 MG/ML IV SOLN COMPARISON:  06/05/2017 CT abdomen/pelvis. FINDINGS: Lower chest: Mild hypoventilatory changes in the dependent lung bases. Hepatobiliary: Normal liver size and configuration. Heterogeneous moderate to severe diffuse hepatic steatosis. Minimally complex 1.1 cm lateral segment left liver lobe cyst with thin internal septation. Additional scattered simple subcentimeter liver cysts. No suspicious liver masses. Multiple subcentimeter gallstones and sludge are seen layering in the nondistended gallbladder, with no gallbladder wall thickening or pericholecystic fluid. No biliary ductal dilatation. Common bile duct diameter 4 mm. No choledocholithiasis. No biliary strictures or masses. No ampullary mass. Pancreas: There is diffuse pancreatic parenchymal and peripancreatic edema, compatible with acute pancreatitis. No measurable peripancreatic fluid collections. Pancreatic parenchymal enhancement is preserved. No pancreatic mass or duct dilation. No pancreas divisum. Spleen: Normal size. No mass. Adrenals/Urinary Tract: Normal adrenals. No hydronephrosis. There is a 1.5 cm renal cortical mass in the upper right kidney (series 1300/ image 46), which demonstrates relatively uniform precontrast T1  hyperintensity, heterogeneous T2 hypointensity and no convincing enhancement, compatible with a Bosniak category 2 hemorrhagic/ proteinaceous renal cyst. Additional scattered simple subcentimeter renal cysts are noted in the mid to upper right kidney. No suspicious renal masses. Stomach/Bowel: Grossly normal stomach. Visualized small and large bowel is normal caliber, with no bowel wall thickening. Vascular/Lymphatic: Atherosclerotic nonaneurysmal abdominal aorta. Patent portal, splenic, hepatic and renal veins. No pathologically enlarged lymph nodes in the abdomen. Other: Trace perihepatic and perisplenic ascites. Inflammatory edema tracks into the anterior paranephric retroperitoneal spaces bilaterally. No focal fluid collections. Musculoskeletal: No aggressive appearing focal osseous lesions. IMPRESSION: 1. Acute uncomplicated pancreatitis. No measurable peripancreatic fluid collections. Trace perihepatic and perisplenic ascites. 2. Cholelithiasis. No evidence of acute cholecystitis. No biliary ductal dilatation. No choledocholithiasis. 3. Moderate to severe diffuse hepatic steatosis. 4. Small Bosniak category 2 hemorrhagic/proteinaceous upper right renal cyst. 5.  Aortic Atherosclerosis (ICD10-I70.0). Electronically Signed   By: Ilona Sorrel M.D.   On: 06/06/2017 11:58        Scheduled Meds: . enoxaparin (LOVENOX) injection  40 mg Subcutaneous Q24H  . folic acid  1 mg Oral Daily  . thiamine  100 mg Oral Daily  . zonisamide  100 mg Oral BID   Continuous Infusions: . 0.9 % NaCl with KCl 40 mEq / L 125 mL/hr (06/06/17 1141)     LOS: 1 day        Aline August, MD [facility] Pager [phone]  If 7PM-7AM, please contact night-coverage [email or web address] Password [email or web address] 06/06/2017, 1:19 PM

## 2017-06-07 DIAGNOSIS — E440 Moderate protein-calorie malnutrition: Secondary | ICD-10-CM | POA: Insufficient documentation

## 2017-06-07 DIAGNOSIS — K8500 Idiopathic acute pancreatitis without necrosis or infection: Secondary | ICD-10-CM

## 2017-06-07 LAB — FOLATE RBC
FOLATE, RBC: 1289 ng/mL (ref >498)
Folate, Hemolysate: 431.7 ng/mL
Hematocrit: 33.5 % — ABNORMAL LOW (ref 34.0–46.6)

## 2017-06-07 LAB — CBC WITH DIFFERENTIAL/PLATELET
BASOS ABS: 0 10*3/uL (ref 0.0–0.1)
Basophils Relative: 0 %
Eosinophils Absolute: 0.3 10*3/uL (ref 0.0–0.7)
Eosinophils Relative: 4 %
HEMATOCRIT: 28.3 % — AB (ref 36.0–46.0)
HEMOGLOBIN: 9.5 g/dL — AB (ref 12.0–15.0)
LYMPHS PCT: 16 %
Lymphs Abs: 1.3 10*3/uL (ref 0.7–4.0)
MCH: 34.5 pg — ABNORMAL HIGH (ref 26.0–34.0)
MCHC: 33.6 g/dL (ref 30.0–36.0)
MCV: 102.9 fL — AB (ref 78.0–100.0)
MONO ABS: 0.5 10*3/uL (ref 0.1–1.0)
Monocytes Relative: 6 %
NEUTROS ABS: 6.1 10*3/uL (ref 1.7–7.7)
Neutrophils Relative %: 74 %
Platelets: 187 10*3/uL (ref 150–400)
RBC: 2.75 MIL/uL — ABNORMAL LOW (ref 3.87–5.11)
RDW: 12.9 % (ref 11.5–15.5)
WBC: 8.2 10*3/uL (ref 4.0–10.5)

## 2017-06-07 LAB — COMPREHENSIVE METABOLIC PANEL
ALK PHOS: 124 U/L (ref 38–126)
ALT: 22 U/L (ref 14–54)
AST: 47 U/L — AB (ref 15–41)
Albumin: 2.1 g/dL — ABNORMAL LOW (ref 3.5–5.0)
Anion gap: 6 (ref 5–15)
BILIRUBIN TOTAL: 0.8 mg/dL (ref 0.3–1.2)
BUN: 5 mg/dL — AB (ref 6–20)
CALCIUM: 7.8 mg/dL — AB (ref 8.9–10.3)
CO2: 23 mmol/L (ref 22–32)
CREATININE: 0.56 mg/dL (ref 0.44–1.00)
Chloride: 107 mmol/L (ref 101–111)
GFR calc Af Amer: >60 mL/min (ref >60)
Glucose, Bld: 105 mg/dL — ABNORMAL HIGH (ref 65–99)
Potassium: 3.1 mmol/L — ABNORMAL LOW (ref 3.5–5.1)
Sodium: 136 mmol/L (ref 135–145)
TOTAL PROTEIN: 5.4 g/dL — AB (ref 6.5–8.1)

## 2017-06-07 LAB — LIPASE, BLOOD: Lipase: 136 U/L — ABNORMAL HIGH (ref 11–51)

## 2017-06-07 LAB — MAGNESIUM: MAGNESIUM: 1.4 mg/dL — AB (ref 1.7–2.4)

## 2017-06-07 MED ORDER — MAGNESIUM SULFATE 2 GM/50ML IV SOLN
2.0000 g | Freq: Once | INTRAVENOUS | Status: AC
Start: 2017-06-07 — End: 2017-06-07
  Administered 2017-06-07: 2 g via INTRAVENOUS
  Filled 2017-06-07: qty 50

## 2017-06-07 NOTE — Progress Notes (Signed)
[name], female   DOB: 08/11/1961, 56 y.o.   MRN: [id number]  PROGRESS NOTE    Ski Polich  YWV:[id number] DOB: [birth year] DOA: 06/05/2017 PCP: Rogers Blocker, MD   Brief Narrative:  56 year old female with history of alcohol abuse, seizures and hypertension presented with epigastric pain and vomiting. She was admitted with acute pancreatitis most recent alcohol started on IV fluids and IV analgesics. CT abdomen showed colitis and cholelithiasis. MRCP did not show any choledocholithiasis. Gen. surgery was consulted.  Assessment & Plan:   Principal Problem:   Acute pancreatitis Active Problems:   Elevated LFTs   Hypokalemia   Protein-calorie malnutrition, severe   Seizure (HCC)   Alcohol abuse with intoxication (HCC)   Macrocytic anemia   Malnutrition of moderate degree   1. Acute pancreatitis, suspect mostly alcohol induced: - CT abdomen showed cholelithiasis. MRCP did not show choledocholithiasis.  Zonegran not frequent cause of pancreatitis. -Continue IV fluids.  - Clear liquid diet. -Dilaudid IV for pain -Trend LFTs   2. Hypokalemia: From alcohol use. -Supplelement potassium in IV fluids - Repeat a.m. Labs  3. Hypertension: Monitor blood pressure. States she has been taken off her antihypertensives.  4. Macrocytic anemia: Likely from alcohol.    5. Prolonged QTc: -Hold QT prolonging meds - Monitor telemetry - Replace potassium. Repeat a.m. labs  6. Seizures: No recent seizure activity -Continue Zonegran  7. Alcoholism: -Cessation recommended, patient wants to quit, however, does not feel at all that she has dependence issues -SW consult for alcohol cessation resources -CIWA protocol with on demand lorazepam -Thiamine and folic acid  8. Cholelithiasis - General surgery consult appreciated. Probable plan for surgery tomorrow.  9. Hypomagnesemia - Replace and repeat a.m. labs  10. Probable asymptomatic  bacteriuria - Monitor off antibiotics  11. Probable moderate malnutrition - Follow nutrition recommendations      DVT prophylaxis:Lovenox Code Status:FULL Family Communication:None present Disposition Plan: Home  in 2-3 days once cleared by general surgery Consultants:  general surgery Procedures:None  Antimicrobials: None  Subjective: Patient seen and examined at bedside. She complains of mild the abdominal pain but no current nausea or vomiting. No overnight fever  Objective: Vitals:   06/06/17 0411 06/06/17 1533 06/07/17 0005 06/07/17 0550  BP: 124/78 131/86 118/80 112/79  Pulse: 70 82 80 77  Resp: 17 16 17 16   Temp: (!) 97.5 F (36.4 C)  98.2 F (36.8 C) 98 F (36.7 C)  TempSrc: Oral  Oral Oral  SpO2: 100% 100% 100% 100%  Weight:    70.6 kg (155 lb 11.2 oz)  Height:        Intake/Output Summary (Last 24 hours) at 06/07/17 1059 Last data filed at 06/07/17 0700  Gross per 24 hour  Intake          2694.58 ml  Output              100 ml  Net          2594.58 ml   Filed Weights   06/05/17 1706 06/05/17 2338 06/07/17 0550  Weight: 61.2 kg (135 lb) 66 kg (145 lb 6.4 oz) 70.6 kg (155 lb 11.2 oz)    Examination:  General exam: Appears calm and comfortable  Respiratory system: Bilateral decreased breath sound at bases Cardiovascular system: S1 & S2 heard, Rate controlled.  Gastrointestinal system: Abdomen is nondistended, soft and mildly tender in the epigastric and right upper quadrant regions. Normal bowel sounds heard. Extremities: No cyanosis, clubbing, edema  Data Reviewed: I have personally reviewed following labs and imaging studies  CBC:  Recent Labs Lab 06/05/17 1736 06/06/17 0602 06/07/17 0445  WBC 8.8 8.7 8.2  NEUTROABS  --   --  6.1  HGB 12.7 10.9* 9.5*  HCT 36.7 33.2* 28.3*  MCV 101.4* 102.5* 102.9*  PLT 265 232 161   Basic Metabolic Panel:  Recent Labs Lab 06/05/17 1736 06/06/17 0602 06/07/17 0445  NA 141 142 136    K 3.2* 3.2* 3.1*  CL 103 108 107  CO2 22 24 23   GLUCOSE 81 82 105*  BUN 10 11 5*  CREATININE 0.85 0.68 0.56  CALCIUM 9.0 8.5* 7.8*  MG 1.7  --  1.4*   GFR: Estimated Creatinine Clearance: 81.1 mL/min (by C-G formula based on SCr of 0.56 mg/dL). Liver Function Tests:  Recent Labs Lab 06/05/17 1736 06/06/17 0602 06/07/17 0445  AST 118* 61* 47*  ALT 41 28 22  ALKPHOS 221* 149* 124  BILITOT 1.7* 1.3* 0.8  PROT 7.3 6.0* 5.4*  ALBUMIN 3.0* 2.4* 2.1*    Recent Labs Lab 06/05/17 1736 06/07/17 0445  LIPASE 747* 136*   No results for input(s): AMMONIA in the last 168 hours. Coagulation Profile: No results for input(s): INR, PROTIME in the last 168 hours. Cardiac Enzymes: No results for input(s): CKTOTAL, CKMB, CKMBINDEX, TROPONINI in the last 168 hours. BNP (last 3 results) No results for input(s): PROBNP in the last 8760 hours. HbA1C: No results for input(s): HGBA1C in the last 72 hours. CBG: No results for input(s): GLUCAP in the last 168 hours. Lipid Profile: No results for input(s): CHOL, HDL, LDLCALC, TRIG, CHOLHDL, LDLDIRECT in the last 72 hours. Thyroid Function Tests: No results for input(s): TSH, T4TOTAL, FREET4, T3FREE, THYROIDAB in the last 72 hours. Anemia Panel:  Recent Labs  06/06/17 0602  VITAMINB12 984*   Sepsis Labs:  Recent Labs Lab 06/05/17 1801 06/05/17 2101  LATICACIDVEN 1.99* 1.84    Recent Results (from the past 240 hour(s))  Urine culture     Status: Abnormal (Preliminary result)   Collection Time: 06/05/17  8:05 PM  Result Value Ref Range Status   Specimen Description URINE, RANDOM  Final   Special Requests NONE  Final   Culture >=100,000 COLONIES/mL GRAM NEGATIVE RODS (A)  Final   Report Status PENDING  Incomplete         Radiology Studies: Ct Abdomen Pelvis W Contrast  Result Date: 06/05/2017 CLINICAL DATA:  Abdominal pain and vomiting. History of pancreatitis EXAM: CT ABDOMEN AND PELVIS WITH CONTRAST TECHNIQUE:  Multidetector CT imaging of the abdomen and pelvis was performed using the standard protocol following bolus administration of intravenous contrast. CONTRAST:  163mL ISOVUE-300 IOPAMIDOL (ISOVUE-300) INJECTION 61% COMPARISON:  None. FINDINGS: Lower chest: Lung bases are clear. 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 Hepatobiliary: There is diffuse hepatic steatosis. No focal liver lesions are evident. There is cholelithiasis. Gallbladder wall does appreciably thickened. There is no biliary duct dilatation. Pancreas: There is peripancreatic fluid surrounding virtually the entire pancreas. Fluid extends into the left upper quadrant to abut a portion of the greater curvature of the stomach. There is mild loculated fluid between the stomach and spleen in the left upper quadrant. Fluid tracks posteriorly on the left to the level of the anterior perinephric fascia. Fluid is also seen tracking into the lateral conal fascia on the left. There is subtle pancreatic edema throughout most of the pancreas. There is no pancreatic  mass or pseudocyst. No pancreatic duct dilatation. Spleen: No splenic lesions are evident. There is a cleft in the lateral midportion of the spleen, either an anatomic variant or residua of prior splenic trauma. Adrenals/Urinary tract: Adrenals appear normal bilaterally. Note that a small amount of peripancreatic fluid abuts the lateral limb of the left adrenal. There is a cyst arising from the posterior upper pole right kidney measuring 1.5 x 1.5 cm. There is a cyst in the medial right kidney upper portion measuring 1.0 x 1.0 cm. There is no hydronephrosis on either side. There is no renal or ureteral calculus on either side. Urinary bladder is midline with wall thickness within normal limits. Stomach/Bowel: There are scattered sigmoid diverticula without diverticulitis. There is no appreciable bowel wall or mesenteric thickening. No evident bowel obstruction. No  free air or portal venous air. Vascular/Lymphatic: There are foci of atherosclerotic calcification in the aorta and common iliac arteries. No aneurysm evident. Major mesenteric vessels appear patent. There is no adenopathy in the abdomen or pelvis. Reproductive: Uterus is absent.  There is no evident pelvic mass. Other: Appendix appears unremarkable. There is no abscess in the abdomen or pelvis. Musculoskeletal: There is degenerative change in the lumbar spine with vacuum phenomenon at L4-5 and L5-S1. There are no blastic or lytic bone lesions. There is no intramuscular or abdominal wall lesions. IMPRESSION: 1. Acute pancreatitis with peripancreatic fluid and mild pancreatic edema. Fluid tracks into the left upper quadrant to abut the greater curvature the stomach as well as demonstrated loculated fluid between the greater curvature of the stomach and spleen. Fluid tracks on the left into the retroperitoneum with fluid abutting the lateral limb of the adrenal and extending to the anterior perinephric fascia on the left. Fluid tracks more inferiorly into the left lateral conal fascia. There is no evident mass or pseudocyst. No appreciable pancreatic duct dilatation. 2.  Cholelithiasis.  No appreciable gallbladder wall thickening. 3.  Diffuse hepatic steatosis. 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 Question residua of old trauma versus anatomic variant. 5. No bowel obstruction. No abscess. Scattered sigmoid diverticula without diverticulitis. Appendix appears normal. 6.  No renal or ureteral calculus.  No hydronephrosis. 7.  Foci of aortoiliac atherosclerosis. Aortic Atherosclerosis (ICD10-I70.0). Electronically Signed   By: Lowella Grip III M.D.   On: 06/05/2017 19:49   Mr 3d Recon At Scanner  Result Date: 06/06/2017 CLINICAL DATA:  Inpatient. Acute pancreatitis. Cholelithiasis. Abnormal liver function tests with mildly elevated total bilirubin level. EXAM: MRI ABDOMEN WITHOUT AND WITH CONTRAST (INCLUDING MRCP)  TECHNIQUE: Multiplanar multisequence MR imaging of the abdomen was performed both before and after the administration of intravenous contrast. Heavily T2-weighted images of the biliary and pancreatic ducts were obtained, and three-dimensional MRCP images were rendered by post processing. CONTRAST:  17mL MULTIHANCE GADOBENATE DIMEGLUMINE 529 MG/ML IV SOLN COMPARISON:  06/05/2017 CT abdomen/pelvis. FINDINGS: Lower chest: Mild hypoventilatory changes in the dependent lung bases. Hepatobiliary: Normal liver size and configuration. Heterogeneous moderate to severe diffuse hepatic steatosis. Minimally complex 1.1 cm lateral segment left liver lobe cyst with thin internal septation. Additional scattered simple subcentimeter liver cysts. No suspicious liver masses. Multiple subcentimeter gallstones and sludge are seen layering in the nondistended gallbladder, with no gallbladder wall thickening or pericholecystic fluid. No biliary ductal dilatation. Common bile duct diameter 4 mm. No choledocholithiasis. No biliary strictures or masses. No ampullary mass. Pancreas: There is diffuse pancreatic parenchymal and peripancreatic edema, compatible with acute pancreatitis. No measurable peripancreatic fluid collections. Pancreatic parenchymal enhancement is preserved.  No pancreatic mass or duct dilation. No pancreas divisum. Spleen: Normal size. No mass. Adrenals/Urinary Tract: Normal adrenals. No hydronephrosis. There is a 1.5 cm renal cortical mass in the upper right kidney (series 1300/ image 46), which demonstrates relatively uniform precontrast T1 hyperintensity, heterogeneous T2 hypointensity and no convincing enhancement, compatible with a Bosniak category 2 hemorrhagic/ proteinaceous renal cyst. Additional scattered simple subcentimeter renal cysts are noted in the mid to upper right kidney. No suspicious renal masses. Stomach/Bowel: Grossly normal stomach. Visualized small and large bowel is normal caliber, with no bowel  wall thickening. Vascular/Lymphatic: Atherosclerotic nonaneurysmal abdominal aorta. Patent portal, splenic, hepatic and renal veins. No pathologically enlarged lymph nodes in the abdomen. Other: Trace perihepatic and perisplenic ascites. Inflammatory edema tracks into the anterior paranephric retroperitoneal spaces bilaterally. No focal fluid collections. Musculoskeletal: No aggressive appearing focal osseous lesions. IMPRESSION: 1. Acute uncomplicated pancreatitis. No measurable peripancreatic fluid collections. Trace perihepatic and perisplenic ascites. 2. Cholelithiasis. No evidence of acute cholecystitis. No biliary ductal dilatation. No choledocholithiasis. 3. Moderate to severe diffuse hepatic steatosis. 4. Small Bosniak category 2 hemorrhagic/proteinaceous upper right renal cyst. 5.  Aortic Atherosclerosis (ICD10-I70.0). Electronically Signed   By: Ilona Sorrel M.D.   On: 06/06/2017 11:58   Mr Abdomen Mrcp Moise Boring Contast  Result Date: 06/06/2017 CLINICAL DATA:  Inpatient. Acute pancreatitis. Cholelithiasis. Abnormal liver function tests with mildly elevated total bilirubin level. EXAM: MRI ABDOMEN WITHOUT AND WITH CONTRAST (INCLUDING MRCP) TECHNIQUE: Multiplanar multisequence MR imaging of the abdomen was performed both before and after the administration of intravenous contrast. Heavily T2-weighted images of the biliary and pancreatic ducts were obtained, and three-dimensional MRCP images were rendered by post processing. CONTRAST:  27mL MULTIHANCE GADOBENATE DIMEGLUMINE 529 MG/ML IV SOLN COMPARISON:  06/05/2017 CT abdomen/pelvis. FINDINGS: Lower chest: Mild hypoventilatory changes in the dependent lung bases. Hepatobiliary: Normal liver size and configuration. Heterogeneous moderate to severe diffuse hepatic steatosis. Minimally complex 1.1 cm lateral segment left liver lobe cyst with thin internal septation. Additional scattered simple subcentimeter liver cysts. No suspicious liver masses. Multiple  subcentimeter gallstones and sludge are seen layering in the nondistended gallbladder, with no gallbladder wall thickening or pericholecystic fluid. No biliary ductal dilatation. Common bile duct diameter 4 mm. No choledocholithiasis. No biliary strictures or masses. No ampullary mass. Pancreas: There is diffuse pancreatic parenchymal and peripancreatic edema, compatible with acute pancreatitis. No measurable peripancreatic fluid collections. Pancreatic parenchymal enhancement is preserved. No pancreatic mass or duct dilation. No pancreas divisum. Spleen: Normal size. No mass. Adrenals/Urinary Tract: Normal adrenals. No hydronephrosis. There is a 1.5 cm renal cortical mass in the upper right kidney (series 1300/ image 46), which demonstrates relatively uniform precontrast T1 hyperintensity, heterogeneous T2 hypointensity and no convincing enhancement, compatible with a Bosniak category 2 hemorrhagic/ proteinaceous renal cyst. Additional scattered simple subcentimeter renal cysts are noted in the mid to upper right kidney. No suspicious renal masses. Stomach/Bowel: Grossly normal stomach. Visualized small and large bowel is normal caliber, with no bowel wall thickening. Vascular/Lymphatic: Atherosclerotic nonaneurysmal abdominal aorta. Patent portal, splenic, hepatic and renal veins. No pathologically enlarged lymph nodes in the abdomen. Other: Trace perihepatic and perisplenic ascites. Inflammatory edema tracks into the anterior paranephric retroperitoneal spaces bilaterally. No focal fluid collections. Musculoskeletal: No aggressive appearing focal osseous lesions. IMPRESSION: 1. Acute uncomplicated pancreatitis. No measurable peripancreatic fluid collections. Trace perihepatic and perisplenic ascites. 2. Cholelithiasis. No evidence of acute cholecystitis. No biliary ductal dilatation. No choledocholithiasis. 3. Moderate to severe diffuse hepatic steatosis. 4. Small Bosniak category 2 hemorrhagic/proteinaceous  upper  right renal cyst. 5.  Aortic Atherosclerosis (ICD10-I70.0). Electronically Signed   By: Ilona Sorrel M.D.   On: 06/06/2017 11:58        Scheduled Meds: . [START ON 06/08/2017] enoxaparin (LOVENOX) injection  40 mg Subcutaneous Q24H  . feeding supplement  1 Container Oral TID BM  . folic acid  1 mg Oral Daily  . multivitamin with minerals  1 tablet Oral Daily  . thiamine  100 mg Oral Daily  . zonisamide  100 mg Oral BID   Continuous Infusions: . 0.9 % NaCl with KCl 40 mEq / L 125 mL/hr (06/07/17 0422)     LOS: 2 days        Aline August, MD [facility] Pager [phone]  If 7PM-7AM, please contact night-coverage [email or web address] Password [email or web address] 06/07/2017, 10:59 AM

## 2017-06-07 NOTE — Progress Notes (Signed)
Subjective/Chief Complaint: CC:  Epigastric/ LUQ pain  Still requiring IV pain meds Drank some Boost last night - no nausea No BM yet   Objective: Vital signs in last 24 hours: Temp:  [98 F (36.7 C)-98.2 F (36.8 C)] 98 F (36.7 C) (08/16 0550) Pulse Rate:  [77-82] 77 (08/16 0550) Resp:  [16-17] 16 (08/16 0550) BP: (112-131)/(79-86) 112/79 (08/16 0550) SpO2:  [100 %] 100 % (08/16 0550) Weight:  [70.6 kg (155 lb 11.2 oz)] 70.6 kg (155 lb 11.2 oz) (08/16 0550) Last BM Date: 06/05/17  Intake/Output from previous day: 08/15 0701 - 08/16 0700 In: 2694.6 [P.O.:280; I.V.:2414.6] Out: 100 [Urine:100] Intake/Output this shift: No intake/output data recorded.  General appearance: alert, cooperative and no distress Eyes: no scleral icterus GI: hypoactive bowel sounds, minimal distention; tender in epigastrium and LUQ  Lab Results:   Recent Labs  06/06/17 0602 06/07/17 0445  WBC 8.7 8.2  HGB 10.9* 9.5*  HCT 33.2* 28.3*  PLT 232 187   BMET  Recent Labs  06/06/17 0602 06/07/17 0445  NA 142 136  K 3.2* 3.1*  CL 108 107  CO2 24 23  GLUCOSE 82 105*  BUN 11 5*  CREATININE 0.68 0.56  CALCIUM 8.5* 7.8*   Hepatic Function Latest Ref Rng & Units 06/07/2017 06/06/2017 06/05/2017  Total Protein 6.5 - 8.1 g/dL 5.4(L) 6.0(L) 7.3  Albumin 3.5 - 5.0 g/dL 2.1(L) 2.4(L) 3.0(L)  AST 15 - 41 U/L 47(H) 61(H) 118(H)  ALT 14 - 54 U/L 22 28 41  Alk Phosphatase 38 - 126 U/L 124 149(H) 221(H)  Total Bilirubin 0.3 - 1.2 mg/dL 0.8 1.3(H) 1.7(H)  Bilirubin, Direct 0.1 - 0.5 mg/dL - - -   Lab Results  Component Value Date   LIPASE 136 (H) 06/07/2017    PT/INR No results for input(s): LABPROT, INR in the last 72 hours. ABG No results for input(s): PHART, HCO3 in the last 72 hours.  Invalid input(s): PCO2, PO2  Studies/Results: Ct Abdomen Pelvis W Contrast  Result Date: 06/05/2017 CLINICAL DATA:  Abdominal pain and vomiting. History of pancreatitis EXAM: CT ABDOMEN AND  PELVIS WITH CONTRAST TECHNIQUE: Multidetector CT imaging of the abdomen and pelvis was performed using the standard protocol following bolus administration of intravenous contrast. CONTRAST:  122m ISOVUE-300 IOPAMIDOL (ISOVUE-300) INJECTION 61% COMPARISON:  None. FINDINGS: Lower chest: Lung bases are clear. 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 Hepatobiliary: There is diffuse hepatic steatosis. No focal liver lesions are evident. There is cholelithiasis. Gallbladder wall does appreciably thickened. There is no biliary duct dilatation. Pancreas: There is peripancreatic fluid surrounding virtually the entire pancreas. Fluid extends into the left upper quadrant to abut a portion of the greater curvature of the stomach. There is mild loculated fluid between the stomach and spleen in the left upper quadrant. Fluid tracks posteriorly on the left to the level of the anterior perinephric fascia. Fluid is also seen tracking into the lateral conal fascia on the left. There is subtle pancreatic edema throughout most of the pancreas. There is no pancreatic mass or pseudocyst. No pancreatic duct dilatation. Spleen: No splenic lesions are evident. There is a cleft in the lateral midportion of the spleen, either an anatomic variant or residua of prior splenic trauma. Adrenals/Urinary tract: Adrenals appear normal bilaterally. Note that a small amount of peripancreatic fluid abuts the lateral limb of the left adrenal. There is a cyst arising from the posterior upper pole right kidney measuring 1.5  x 1.5 cm. There is a cyst in the medial right kidney upper portion measuring 1.0 x 1.0 cm. There is no hydronephrosis on either side. There is no renal or ureteral calculus on either side. Urinary bladder is midline with wall thickness within normal limits. Stomach/Bowel: There are scattered sigmoid diverticula without diverticulitis. There is no appreciable bowel wall or mesenteric thickening. No  evident bowel obstruction. No free air or portal venous air. Vascular/Lymphatic: There are foci of atherosclerotic calcification in the aorta and common iliac arteries. No aneurysm evident. Major mesenteric vessels appear patent. There is no adenopathy in the abdomen or pelvis. Reproductive: Uterus is absent.  There is no evident pelvic mass. Other: Appendix appears unremarkable. There is no abscess in the abdomen or pelvis. Musculoskeletal: There is degenerative change in the lumbar spine with vacuum phenomenon at L4-5 and L5-S1. There are no blastic or lytic bone lesions. There is no intramuscular or abdominal wall lesions. IMPRESSION: 1. Acute pancreatitis with peripancreatic fluid and mild pancreatic edema. Fluid tracks into the left upper quadrant to abut the greater curvature the stomach as well as demonstrated loculated fluid between the greater curvature of the stomach and spleen. Fluid tracks on the left into the retroperitoneum with fluid abutting the lateral limb of the adrenal and extending to the anterior perinephric fascia on the left. Fluid tracks more inferiorly into the left lateral conal fascia. There is no evident mass or pseudocyst. No appreciable pancreatic duct dilatation. 2.  Cholelithiasis.  No appreciable gallbladder wall thickening. 3.  Diffuse hepatic steatosis. 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 Question residua of old trauma versus anatomic variant. 5. No bowel obstruction. No abscess. Scattered sigmoid diverticula without diverticulitis. Appendix appears normal. 6.  No renal or ureteral calculus.  No hydronephrosis. 7.  Foci of aortoiliac atherosclerosis. Aortic Atherosclerosis (ICD10-I70.0). Electronically Signed   By: Lowella Grip III M.D.   On: 06/05/2017 19:49   Mr 3d Recon At Scanner  Result Date: 06/06/2017 CLINICAL DATA:  Inpatient. Acute pancreatitis. Cholelithiasis. Abnormal liver function tests with mildly elevated total bilirubin level. EXAM: MRI ABDOMEN WITHOUT AND  WITH CONTRAST (INCLUDING MRCP) TECHNIQUE: Multiplanar multisequence MR imaging of the abdomen was performed both before and after the administration of intravenous contrast. Heavily T2-weighted images of the biliary and pancreatic ducts were obtained, and three-dimensional MRCP images were rendered by post processing. CONTRAST:  63m MULTIHANCE GADOBENATE DIMEGLUMINE 529 MG/ML IV SOLN COMPARISON:  06/05/2017 CT abdomen/pelvis. FINDINGS: Lower chest: Mild hypoventilatory changes in the dependent lung bases. Hepatobiliary: Normal liver size and configuration. Heterogeneous moderate to severe diffuse hepatic steatosis. Minimally complex 1.1 cm lateral segment left liver lobe cyst with thin internal septation. Additional scattered simple subcentimeter liver cysts. No suspicious liver masses. Multiple subcentimeter gallstones and sludge are seen layering in the nondistended gallbladder, with no gallbladder wall thickening or pericholecystic fluid. No biliary ductal dilatation. Common bile duct diameter 4 mm. No choledocholithiasis. No biliary strictures or masses. No ampullary mass. Pancreas: There is diffuse pancreatic parenchymal and peripancreatic edema, compatible with acute pancreatitis. No measurable peripancreatic fluid collections. Pancreatic parenchymal enhancement is preserved. No pancreatic mass or duct dilation. No pancreas divisum. Spleen: Normal size. No mass. Adrenals/Urinary Tract: Normal adrenals. No hydronephrosis. There is a 1.5 cm renal cortical mass in the upper right kidney (series 1300/ image 46), which demonstrates relatively uniform precontrast T1 hyperintensity, heterogeneous T2 hypointensity and no convincing enhancement, compatible with a Bosniak category 2 hemorrhagic/ proteinaceous renal cyst. Additional scattered simple subcentimeter renal cysts are noted in  the mid to upper right kidney. No suspicious renal masses. Stomach/Bowel: Grossly normal stomach. Visualized small and large bowel is  normal caliber, with no bowel wall thickening. Vascular/Lymphatic: Atherosclerotic nonaneurysmal abdominal aorta. Patent portal, splenic, hepatic and renal veins. No pathologically enlarged lymph nodes in the abdomen. Other: Trace perihepatic and perisplenic ascites. Inflammatory edema tracks into the anterior paranephric retroperitoneal spaces bilaterally. No focal fluid collections. Musculoskeletal: No aggressive appearing focal osseous lesions. IMPRESSION: 1. Acute uncomplicated pancreatitis. No measurable peripancreatic fluid collections. Trace perihepatic and perisplenic ascites. 2. Cholelithiasis. No evidence of acute cholecystitis. No biliary ductal dilatation. No choledocholithiasis. 3. Moderate to severe diffuse hepatic steatosis. 4. Small Bosniak category 2 hemorrhagic/proteinaceous upper right renal cyst. 5.  Aortic Atherosclerosis (ICD10-I70.0). Electronically Signed   By: Ilona Sorrel M.D.   On: 06/06/2017 11:58   Mr Abdomen Mrcp Moise Boring Contast  Result Date: 06/06/2017 CLINICAL DATA:  Inpatient. Acute pancreatitis. Cholelithiasis. Abnormal liver function tests with mildly elevated total bilirubin level. EXAM: MRI ABDOMEN WITHOUT AND WITH CONTRAST (INCLUDING MRCP) TECHNIQUE: Multiplanar multisequence MR imaging of the abdomen was performed both before and after the administration of intravenous contrast. Heavily T2-weighted images of the biliary and pancreatic ducts were obtained, and three-dimensional MRCP images were rendered by post processing. CONTRAST:  21m MULTIHANCE GADOBENATE DIMEGLUMINE 529 MG/ML IV SOLN COMPARISON:  06/05/2017 CT abdomen/pelvis. FINDINGS: Lower chest: Mild hypoventilatory changes in the dependent lung bases. Hepatobiliary: Normal liver size and configuration. Heterogeneous moderate to severe diffuse hepatic steatosis. Minimally complex 1.1 cm lateral segment left liver lobe cyst with thin internal septation. Additional scattered simple subcentimeter liver cysts. No  suspicious liver masses. Multiple subcentimeter gallstones and sludge are seen layering in the nondistended gallbladder, with no gallbladder wall thickening or pericholecystic fluid. No biliary ductal dilatation. Common bile duct diameter 4 mm. No choledocholithiasis. No biliary strictures or masses. No ampullary mass. Pancreas: There is diffuse pancreatic parenchymal and peripancreatic edema, compatible with acute pancreatitis. No measurable peripancreatic fluid collections. Pancreatic parenchymal enhancement is preserved. No pancreatic mass or duct dilation. No pancreas divisum. Spleen: Normal size. No mass. Adrenals/Urinary Tract: Normal adrenals. No hydronephrosis. There is a 1.5 cm renal cortical mass in the upper right kidney (series 1300/ image 46), which demonstrates relatively uniform precontrast T1 hyperintensity, heterogeneous T2 hypointensity and no convincing enhancement, compatible with a Bosniak category 2 hemorrhagic/ proteinaceous renal cyst. Additional scattered simple subcentimeter renal cysts are noted in the mid to upper right kidney. No suspicious renal masses. Stomach/Bowel: Grossly normal stomach. Visualized small and large bowel is normal caliber, with no bowel wall thickening. Vascular/Lymphatic: Atherosclerotic nonaneurysmal abdominal aorta. Patent portal, splenic, hepatic and renal veins. No pathologically enlarged lymph nodes in the abdomen. Other: Trace perihepatic and perisplenic ascites. Inflammatory edema tracks into the anterior paranephric retroperitoneal spaces bilaterally. No focal fluid collections. Musculoskeletal: No aggressive appearing focal osseous lesions. IMPRESSION: 1. Acute uncomplicated pancreatitis. No measurable peripancreatic fluid collections. Trace perihepatic and perisplenic ascites. 2. Cholelithiasis. No evidence of acute cholecystitis. No biliary ductal dilatation. No choledocholithiasis. 3. Moderate to severe diffuse hepatic steatosis. 4. Small Bosniak  category 2 hemorrhagic/proteinaceous upper right renal cyst. 5.  Aortic Atherosclerosis (ICD10-I70.0). Electronically Signed   By: JIlona SorrelM.D.   On: 06/06/2017 11:58    Anti-infectives: Anti-infectives    None      Assessment/Plan: Pancreatitis - lipase trending down, but still elevated and patient still with tenderness T.bili back down to normal Possible etiologies for pancreatitis - gallstones/ new seizure meds/ EtOH  Will recheck labs in  AM.  If lipase close to normal and tenderness improved, then will proceed with lap chole tomorrow.   May sip on clear liquids today, npo p mn    LOS: 2 days    Celedonio Sortino K. 06/07/2017

## 2017-06-07 NOTE — Progress Notes (Signed)
CSW received referral regarding ETOH use. CSW spoke with patient. She reported that she has family at home to help her and no longer drinks alcohol due to a personal decision. She has applied for disability and financial counseling is helping her apply for Medicaid. Patient reported no other concerns.    CSW signing off.  Percell Locus Jude Naclerio LCSWA [phone]

## 2017-06-08 ENCOUNTER — Inpatient Hospital Stay (HOSPITAL_COMMUNITY): Payer: Self-pay | Admitting: Certified Registered Nurse Anesthetist

## 2017-06-08 ENCOUNTER — Encounter (HOSPITAL_COMMUNITY)
Admission: EM | Disposition: A | Discharge status: Home / Self Care | Payer: Self-pay | Source: Home / Self Care | Attending: Internal Medicine

## 2017-06-08 ENCOUNTER — Encounter (HOSPITAL_COMMUNITY): Payer: Self-pay | Admitting: Certified Registered Nurse Anesthetist

## 2017-06-08 ENCOUNTER — Inpatient Hospital Stay (HOSPITAL_COMMUNITY): Payer: Self-pay

## 2017-06-08 HISTORY — PX: CHOLECYSTECTOMY: SHX55

## 2017-06-08 LAB — COMPREHENSIVE METABOLIC PANEL
ALBUMIN: 2.1 g/dL — AB (ref 3.5–5.0)
ALT: 23 U/L (ref 14–54)
AST: 49 U/L — AB (ref 15–41)
Alkaline Phosphatase: 138 U/L — ABNORMAL HIGH (ref 38–126)
Anion gap: 7 (ref 5–15)
CHLORIDE: 108 mmol/L (ref 101–111)
CO2: 19 mmol/L — AB (ref 22–32)
CREATININE: 0.58 mg/dL (ref 0.44–1.00)
Calcium: 8.1 mg/dL — ABNORMAL LOW (ref 8.9–10.3)
GFR calc Af Amer: >60 mL/min (ref >60)
GFR calc non Af Amer: >60 mL/min (ref >60)
GLUCOSE: 98 mg/dL (ref 65–99)
Potassium: 3.3 mmol/L — ABNORMAL LOW (ref 3.5–5.1)
SODIUM: 134 mmol/L — AB (ref 135–145)
Total Bilirubin: 0.6 mg/dL (ref 0.3–1.2)
Total Protein: 5.5 g/dL — ABNORMAL LOW (ref 6.5–8.1)

## 2017-06-08 LAB — CBC
HCT: 30 % — ABNORMAL LOW (ref 36.0–46.0)
Hemoglobin: 10.1 g/dL — ABNORMAL LOW (ref 12.0–15.0)
MCH: 35.3 pg — ABNORMAL HIGH (ref 26.0–34.0)
MCHC: 33.7 g/dL (ref 30.0–36.0)
MCV: 104.9 fL — ABNORMAL HIGH (ref 78.0–100.0)
PLATELETS: 177 10*3/uL (ref 150–400)
RBC: 2.86 MIL/uL — AB (ref 3.87–5.11)
RDW: 12.8 % (ref 11.5–15.5)
WBC: 6.1 10*3/uL (ref 4.0–10.5)

## 2017-06-08 LAB — URINE CULTURE

## 2017-06-08 LAB — LIPASE, BLOOD: Lipase: 84 U/L — ABNORMAL HIGH (ref 11–51)

## 2017-06-08 LAB — MAGNESIUM: MAGNESIUM: 1.7 mg/dL (ref 1.7–2.4)

## 2017-06-08 SURGERY — LAPAROSCOPIC CHOLECYSTECTOMY WITH INTRAOPERATIVE CHOLANGIOGRAM
Anesthesia: General | Site: Abdomen

## 2017-06-08 MED ORDER — BUPIVACAINE-EPINEPHRINE (PF) 0.25% -1:200000 IJ SOLN
INTRAMUSCULAR | Status: AC
  Filled 2017-06-08: qty 30

## 2017-06-08 MED ORDER — METOCLOPRAMIDE HCL 5 MG/ML IJ SOLN: 10.0000 mg | Freq: Once | INTRAMUSCULAR | Status: DC | PRN

## 2017-06-08 MED ORDER — MIDAZOLAM HCL 5 MG/5ML IJ SOLN
INTRAMUSCULAR | Status: DC | PRN
  Administered 2017-06-08: 2 mg via INTRAVENOUS

## 2017-06-08 MED ORDER — MIDAZOLAM HCL 2 MG/2ML IJ SOLN
INTRAMUSCULAR | Status: AC
  Filled 2017-06-08: qty 2

## 2017-06-08 MED ORDER — FENTANYL CITRATE (PF) 100 MCG/2ML IJ SOLN: 25.0000 ug | INTRAMUSCULAR | Status: DC | PRN

## 2017-06-08 MED ORDER — CEFAZOLIN SODIUM-DEXTROSE 2-4 GM/100ML-% IV SOLN
2.0000 g | INTRAVENOUS | Status: AC
  Administered 2017-06-08: 2 g via INTRAVENOUS
  Filled 2017-06-08 (×2): qty 100

## 2017-06-08 MED ORDER — LIDOCAINE HCL (CARDIAC) 20 MG/ML IV SOLN
INTRAVENOUS | Status: DC | PRN
  Administered 2017-06-08: 70 mg via INTRAVENOUS

## 2017-06-08 MED ORDER — IOPAMIDOL (ISOVUE-300) INJECTION 61%
INTRAVENOUS | Status: AC
  Filled 2017-06-08: qty 50

## 2017-06-08 MED ORDER — DEXAMETHASONE SODIUM PHOSPHATE 10 MG/ML IJ SOLN
INTRAMUSCULAR | Status: DC | PRN
  Administered 2017-06-08: 5 mg via INTRAVENOUS

## 2017-06-08 MED ORDER — SODIUM CHLORIDE 0.9 % IR SOLN
Status: DC | PRN
  Administered 2017-06-08: 1000 mL

## 2017-06-08 MED ORDER — TRAMADOL HCL 50 MG PO TABS: 50.0000 mg | ORAL_TABLET | Freq: Four times a day (QID) | ORAL | Status: DC | PRN

## 2017-06-08 MED ORDER — ONDANSETRON HCL 4 MG/2ML IJ SOLN
INTRAMUSCULAR | Status: DC | PRN
  Administered 2017-06-08: 4 mg via INTRAVENOUS

## 2017-06-08 MED ORDER — MEPERIDINE HCL 25 MG/ML IJ SOLN: 6.2500 mg | INTRAMUSCULAR | Status: DC | PRN

## 2017-06-08 MED ORDER — 0.9 % SODIUM CHLORIDE (POUR BTL) OPTIME
TOPICAL | Status: DC | PRN
  Administered 2017-06-08: 1000 mL

## 2017-06-08 MED ORDER — LACTATED RINGERS IV SOLN: INTRAVENOUS | Status: DC

## 2017-06-08 MED ORDER — SUGAMMADEX SODIUM 200 MG/2ML IV SOLN
INTRAVENOUS | Status: DC | PRN
  Administered 2017-06-08: 150 mg via INTRAVENOUS

## 2017-06-08 MED ORDER — SODIUM CHLORIDE 0.9 % IV SOLN
INTRAVENOUS | Status: DC | PRN
  Administered 2017-06-08: 4 mL

## 2017-06-08 MED ORDER — PROPOFOL 10 MG/ML IV BOLUS
INTRAVENOUS | Status: AC
  Filled 2017-06-08: qty 20

## 2017-06-08 MED ORDER — BUPIVACAINE-EPINEPHRINE 0.25% -1:200000 IJ SOLN
INTRAMUSCULAR | Status: DC | PRN
  Administered 2017-06-08: 17 mL

## 2017-06-08 MED ORDER — PROPOFOL 10 MG/ML IV BOLUS
INTRAVENOUS | Status: DC | PRN
  Administered 2017-06-08: 200 mg via INTRAVENOUS

## 2017-06-08 MED ORDER — LACTATED RINGERS IV SOLN
INTRAVENOUS | Status: DC
  Administered 2017-06-08: 08:00:00 via INTRAVENOUS

## 2017-06-08 MED ORDER — FENTANYL CITRATE (PF) 250 MCG/5ML IJ SOLN
INTRAMUSCULAR | Status: AC
  Filled 2017-06-08: qty 5

## 2017-06-08 MED ORDER — ROCURONIUM BROMIDE 100 MG/10ML IV SOLN
INTRAVENOUS | Status: DC | PRN
  Administered 2017-06-08: 50 mg via INTRAVENOUS

## 2017-06-08 MED ORDER — FENTANYL CITRATE (PF) 100 MCG/2ML IJ SOLN
INTRAMUSCULAR | Status: DC | PRN
  Administered 2017-06-08 (×2): 50 ug via INTRAVENOUS
  Administered 2017-06-08: 100 ug via INTRAVENOUS
  Administered 2017-06-08 (×6): 50 ug via INTRAVENOUS

## 2017-06-08 MED ORDER — SCOPOLAMINE 1 MG/3DAYS TD PT72
MEDICATED_PATCH | TRANSDERMAL | Status: DC | PRN
  Administered 2017-06-08: 1 via TRANSDERMAL

## 2017-06-08 SURGICAL SUPPLY — 44 items
APL SKNCLS STERI-STRIP NONHPOA (GAUZE/BANDAGES/DRESSINGS) ×1
APPLIER CLIP ROT 10 11.4 M/L (STAPLE) ×3
APR CLP MED LRG 11.4X10 (STAPLE) ×1
BAG SPEC RTRVL LRG 6X4 10 (ENDOMECHANICALS) ×1
BENZOIN TINCTURE PRP APPL 2/3 (GAUZE/BANDAGES/DRESSINGS) ×3 IMPLANT
CANISTER SUCT 3000ML PPV (MISCELLANEOUS) ×3 IMPLANT
CHLORAPREP W/TINT 26ML (MISCELLANEOUS) ×3 IMPLANT
CLIP APPLIE ROT 10 11.4 M/L (STAPLE) ×1 IMPLANT
CLOSURE WOUND 1/2 X4 (GAUZE/BANDAGES/DRESSINGS) ×1
COVER MAYO STAND STRL (DRAPES) ×3 IMPLANT
COVER SURGICAL LIGHT HANDLE (MISCELLANEOUS) ×3 IMPLANT
DECANTER SPIKE VIAL GLASS SM (MISCELLANEOUS) ×2 IMPLANT
DRAPE C-ARM 42X72 X-RAY (DRAPES) ×3 IMPLANT
DRSG TEGADERM 2-3/8X2-3/4 SM (GAUZE/BANDAGES/DRESSINGS) ×9 IMPLANT
DRSG TEGADERM 4X4.75 (GAUZE/BANDAGES/DRESSINGS) ×3 IMPLANT
ELECT REM PT RETURN 9FT ADLT (ELECTROSURGICAL) ×3
ELECTRODE REM PT RTRN 9FT ADLT (ELECTROSURGICAL) ×1 IMPLANT
FILTER SMOKE EVAC LAPAROSHD (FILTER) ×3 IMPLANT
GAUZE SPONGE 2X2 8PLY STRL LF (GAUZE/BANDAGES/DRESSINGS) ×1 IMPLANT
GLOVE BIO SURGEON STRL SZ7 (GLOVE) ×3 IMPLANT
GLOVE BIOGEL PI IND STRL 7.5 (GLOVE) ×1 IMPLANT
GLOVE BIOGEL PI INDICATOR 7.5 (GLOVE) ×2
GOWN STRL REUS W/ TWL LRG LVL3 (GOWN DISPOSABLE) ×3 IMPLANT
GOWN STRL REUS W/TWL LRG LVL3 (GOWN DISPOSABLE) ×9
KIT BASIN OR (CUSTOM PROCEDURE TRAY) ×3 IMPLANT
KIT ROOM TURNOVER OR (KITS) ×3 IMPLANT
NS IRRIG 1000ML POUR BTL (IV SOLUTION) ×3 IMPLANT
PAD ARMBOARD 7.5X6 YLW CONV (MISCELLANEOUS) ×3 IMPLANT
POUCH SPECIMEN RETRIEVAL 10MM (ENDOMECHANICALS) ×3 IMPLANT
SCISSORS LAP 5X35 DISP (ENDOMECHANICALS) ×3 IMPLANT
SET CHOLANGIOGRAPH 5 50 .035 (SET/KITS/TRAYS/PACK) ×3 IMPLANT
SET IRRIG TUBING LAPAROSCOPIC (IRRIGATION / IRRIGATOR) ×3 IMPLANT
SLEEVE ENDOPATH XCEL 5M (ENDOMECHANICALS) ×3 IMPLANT
SPECIMEN JAR SMALL (MISCELLANEOUS) ×3 IMPLANT
SPONGE GAUZE 2X2 STER 10/PKG (GAUZE/BANDAGES/DRESSINGS) ×2
STRIP CLOSURE SKIN 1/2X4 (GAUZE/BANDAGES/DRESSINGS) ×2 IMPLANT
SUT MNCRL AB 4-0 PS2 18 (SUTURE) ×3 IMPLANT
TOWEL OR 17X24 6PK STRL BLUE (TOWEL DISPOSABLE) ×3 IMPLANT
TOWEL OR 17X26 10 PK STRL BLUE (TOWEL DISPOSABLE) ×3 IMPLANT
TRAY LAPAROSCOPIC MC (CUSTOM PROCEDURE TRAY) ×3 IMPLANT
TROCAR XCEL BLUNT TIP 100MML (ENDOMECHANICALS) ×3 IMPLANT
TROCAR XCEL NON-BLD 11X100MML (ENDOMECHANICALS) ×3 IMPLANT
TROCAR XCEL NON-BLD 5MMX100MML (ENDOMECHANICALS) ×3 IMPLANT
TUBING INSUFFLATION (TUBING) ×3 IMPLANT

## 2017-06-08 NOTE — Anesthesia Procedure Notes (Addendum)
Procedure Name: Intubation Date/Time: 06/08/2017 8:54 AM Performed by: Salli Quarry Nzinga Ferran Pre-anesthesia Checklist: Patient identified, Emergency Drugs available, Suction available and Patient being monitored Patient Re-evaluated:Patient Re-evaluated prior to induction Oxygen Delivery Method: Circle System Utilized Preoxygenation: Pre-oxygenation with 100% oxygen Induction Type: IV induction Ventilation: Mask ventilation without difficulty Laryngoscope Size: Mac and 3 Grade View: Grade I Tube type: Oral Tube size: 7.0 mm Number of attempts: 1 Airway Equipment and Method: Stylet Placement Confirmation: positive ETCO2,  ETT inserted through vocal cords under direct vision and breath sounds checked- equal and bilateral Secured at: 22 cm Tube secured with: Tape Dental Injury: Teeth and Oropharynx as per pre-operative assessment

## 2017-06-08 NOTE — Op Note (Signed)
Laparoscopic Cholecystectomy with IOC Procedure Note  Indications: This patient presents with symptomatic gallbladder disease and will undergo laparoscopic cholecystectomy.  Pre-operative Diagnosis: Biliary pancreatitis  Post-operative Diagnosis: Same  Surgeon: Jeniel Slauson K.   Assistants: none  Anesthesia: General endotracheal anesthesia  ASA Class: 1  Procedure Details  The patient was seen again in the Holding Room. The risks, benefits, complications, treatment options, and expected outcomes were discussed with the patient. The possibilities of reaction to medication, pulmonary aspiration, perforation of viscus, bleeding, recurrent infection, finding a normal gallbladder, the need for additional procedures, failure to diagnose a condition, the possible need to convert to an open procedure, and creating a complication requiring transfusion or operation were discussed with the patient. The likelihood of improving the patient's symptoms with return to their baseline status is good.  The patient and/or family concurred with the proposed plan, giving informed consent. The site of surgery properly noted. The patient was taken to Operating Room, identified as Cynthia Welch and the procedure verified as Laparoscopic Cholecystectomy with Intraoperative Cholangiogram. A Time Out was held and the above information confirmed.  Prior to the induction of general anesthesia, antibiotic prophylaxis was administered. General endotracheal anesthesia was then administered and tolerated well. After the induction, the abdomen was prepped with Chloraprep and draped in the sterile fashion. The patient was positioned in the supine position.  Local anesthetic agent was injected into the skin below the umbilicus and an incision made. We dissected down to the abdominal fascia with blunt dissection.  The fascia was incised vertically and we entered the peritoneal cavity bluntly.  A pursestring suture of 0-Vicryl  was placed around the fascial opening.  The Hasson cannula was inserted and secured with the stay suture.  Pneumoperitoneum was then created with CO2 and tolerated well without any adverse changes in the patient's vital signs. An 11-mm port was placed in the subxiphoid position.  Two 5-mm ports were placed in the right upper quadrant. All skin incisions were infiltrated with a local anesthetic agent before making the incision and placing the trocars.    We positioned the patient in reverse Trendelenburg, tilted slightly to the patient's left.  The gallbladder was identified, the fundus grasped and retracted cephalad. Adhesions were lysed bluntly and with the electrocautery where indicated, taking care not to injure any adjacent organs or viscus. The infundibulum was grasped and retracted laterally, exposing the peritoneum overlying the triangle of Calot. This was then divided and exposed in a blunt fashion. A critical view of the cystic duct and cystic artery was obtained.  The cystic duct was clearly identified and bluntly dissected circumferentially. The cystic duct was ligated with a clip distally.   An incision was made in the cystic duct and the [facility] cholangiogram catheter introduced. The catheter was secured using a clip. A cholangiogram was then obtained which showed good visualization of the distal and proximal biliary tree with no sign of filling defects or obstruction.  Contrast flowed easily into the duodenum. The catheter was then removed.   The cystic duct was then ligated with clips and divided. The cystic artery was identified, dissected free, ligated with clips and divided as well.   The gallbladder was dissected from the liver bed in retrograde fashion with the electrocautery. The gallbladder was removed and placed in an Endocatch sac. The liver bed was irrigated and inspected. Hemostasis was achieved with the electrocautery. Copious irrigation was utilized and was repeatedly aspirated until  clear.  The gallbladder and Endocatch sac were then  removed through the umbilical port site.  The pursestring suture was used to close the umbilical fascia.    We again inspected the right upper quadrant for hemostasis.  Pneumoperitoneum was released as we removed the trocars.  4-0 Monocryl was used to close the skin.   Benzoin, steri-strips, and clean dressings were applied. The patient was then extubated and brought to the recovery room in stable condition. Instrument, sponge, and needle counts were correct at closure and at the conclusion of the case.   Findings: Chronic cholecystitis with Cholelithiasis  Estimated Blood Loss: Minimal         Drains: none         Specimens: Gallbladder           Complications: None; patient tolerated the procedure well.         Disposition: PACU - hemodynamically stable.         Condition: stable  Cynthia Welch. Cynthia Dover, MD, [facility] Surgery  General/ Trauma Surgery  06/08/2017 9:54 AM

## 2017-06-08 NOTE — Anesthesia Preprocedure Evaluation (Signed)
Anesthesia Evaluation  Patient identified by MRN, date of birth, ID band Patient awake    Reviewed: Allergy & Precautions, NPO status , Patient's Chart, lab work & pertinent test results  History of Anesthesia Complications (+) PONV  Airway Mallampati: II  TM Distance: >3 FB Neck ROM: Full    Dental no notable dental hx. (+) Missing, Poor Dentition   Pulmonary Current Smoker,    Pulmonary exam normal breath sounds clear to auscultation       Cardiovascular hypertension, Pt. on medications negative cardio ROS Normal cardiovascular exam Rhythm:Regular Rate:Normal     Neuro/Psych Seizures -,  negative psych ROS   GI/Hepatic negative GI ROS, Neg liver ROS,   Endo/Other  negative endocrine ROS  Renal/GU negative Renal ROS  negative genitourinary   Musculoskeletal negative musculoskeletal ROS (+)   Abdominal   Peds negative pediatric ROS (+)  Hematology negative hematology ROS (+)   Anesthesia Other Findings   Reproductive/Obstetrics negative OB ROS                             Anesthesia Physical Anesthesia Plan  ASA: II  Anesthesia Plan: General   Post-op Pain Management:    Induction: Intravenous  PONV Risk Score and Plan: 4 or greater and Ondansetron, Dexamethasone, Midazolam, Scopolamine patch - Pre-op and Treatment may vary due to age or medical condition  Airway Management Planned: Oral ETT  Additional Equipment:   Intra-op Plan:   Post-operative Plan: Extubation in OR  Informed Consent: I have reviewed the patients History and Physical, chart, labs and discussed the procedure including the risks, benefits and alternatives for the proposed anesthesia with the patient or authorized representative who has indicated his/her understanding and acceptance.   Dental advisory given  Plan Discussed with: CRNA  Anesthesia Plan Comments:         Anesthesia Quick  Evaluation

## 2017-06-08 NOTE — Progress Notes (Signed)
[name], female   DOB: 1961/01/25, 56 y.o.   MRN: [id number]  PROGRESS NOTE    Cynthia Welch  AUQ:[id number] DOB: 10-Aug-1961 DOA: 06/05/2017 PCP: Rogers Blocker, MD   Brief Narrative:  56 year old female with history of alcohol abuse, seizures and hypertension presented with epigastric pain and vomiting. She was admitted with acute pancreatitis most recent alcohol started on IV fluids and IV analgesics. CT abdomen showed colitis and cholelithiasis. MRCP did not show any choledocholithiasis. Patient underwent laparoscopic cholecystectomy today  Assessment & Plan:   Principal Problem:   Acute pancreatitis Active Problems:   Elevated LFTs   Hypokalemia   Protein-calorie malnutrition, severe   Seizure (HCC)   Alcohol abuse with intoxication ([place])   Macrocytic anemia   Malnutrition of moderate degree    Acute pancreatitis, suspect mostly alcohol induced: - CT abdomen showed cholelithiasis. MRCP did not show choledocholithiasis. Zonegran not frequent cause of pancreatitis. -Pain management. Advance diet as tolerated and/or as per surgery recommendations  Cholelithiasis - Status post Open cholecystectomy today. Follow further recommendations from general surgery  Hypokalemia: From alcohol use. -Supplelementpotassium in IV fluids - Repeat a.m. Labs  Hypertension: Monitor blood pressure. States she has been taken off her antihypertensives.  Macrocytic anemia: Likely from alcohol.   Seizures: No recent seizure activity -Continue Zonegran   Alcoholism: -Cessation recommended, patient wants to quit, however, does not feel at all that she has dependence issues -No signs of withdrawal currently. -CIWA protocol with on demand lorazepam -Thiamine andfolic acid  Hypomagnesemia -Improved. Repeat a.m. labs  Probable asymptomatic bacteriuria - Monitor off antibiotics  Probable moderate malnutrition - Follow nutrition  recommendations      DVT prophylaxis:Lovenox Code Status:FULL Family Communication:None present Disposition Plan:Home in 1-2 days once cleared by general surgery Consultants: general surgery Procedures:None  Antimicrobials: None  Subjective: Patient seen and examined at bedside. No overnight fever, nausea or vomiting. She complains of mild epigastric pain  Objective: Vitals:   06/08/17 1044 06/08/17 1045 06/08/17 1052 06/08/17 1428  BP: 126/86  127/82 104/80  Pulse: 69 72 70 86  Resp: 12 15 14 18   Temp:   97.8 F (36.6 C) 98.5 F (36.9 C)  TempSrc:    Oral  SpO2: 98% 97% 97% 100%  Weight:      Height:        Intake/Output Summary (Last 24 hours) at 06/08/17 1454 Last data filed at 06/08/17 1300  Gross per 24 hour  Intake             1387 ml  Output               20 ml  Net             1367 ml   Filed Weights   06/05/17 1706 06/05/17 2338 06/07/17 0550  Weight: 61.2 kg (135 lb) 66 kg (145 lb 6.4 oz) 70.6 kg (155 lb 11.2 oz)    Examination:  General exam: Appears calm and comfortable  Respiratory system: Bilateral decreased breath sound at bases Cardiovascular system: S1 & S2 heard, Rate controlled Gastrointestinal system: Abdomen is nondistended, soft and mildly tender in the epigastric region. Normal bowel sounds heard. Extremities: No cyanosis, clubbing, edema     Data Reviewed: I have personally reviewed following labs and imaging studies  CBC:  Recent Labs Lab 06/05/17 1736 06/06/17 0602 06/07/17 0445 06/08/17 0528  WBC 8.8 8.7 8.2 6.1  NEUTROABS  --   --  6.1  --   HGB  12.7 10.9* 9.5* 10.1*  HCT 36.7 33.2*  33.5* 28.3* 30.0*  MCV 101.4* 102.5* 102.9* 104.9*  PLT 265 232 187 681   Basic Metabolic Panel:  Recent Labs Lab 06/05/17 1736 06/06/17 0602 06/07/17 0445 06/08/17 0528  NA 141 142 136 134*  K 3.2* 3.2* 3.1* 3.3*  CL 103 108 107 108  CO2 22 24 23  19*  GLUCOSE 81 82 105* 98  BUN 10 11 5* <5*  CREATININE  0.85 0.68 0.56 0.58  CALCIUM 9.0 8.5* 7.8* 8.1*  MG 1.7  --  1.4* 1.7   GFR: Estimated Creatinine Clearance: 81.1 mL/min (by C-G formula based on SCr of 0.58 mg/dL). Liver Function Tests:  Recent Labs Lab 06/05/17 1736 06/06/17 0602 06/07/17 0445 06/08/17 0528  AST 118* 61* 47* 49*  ALT 41 28 22 23   ALKPHOS 221* 149* 124 138*  BILITOT 1.7* 1.3* 0.8 0.6  PROT 7.3 6.0* 5.4* 5.5*  ALBUMIN 3.0* 2.4* 2.1* 2.1*    Recent Labs Lab 06/05/17 1736 06/07/17 0445 06/08/17 0528  LIPASE 747* 136* 84*   No results for input(s): AMMONIA in the last 168 hours. Coagulation Profile: No results for input(s): INR, PROTIME in the last 168 hours. Cardiac Enzymes: No results for input(s): CKTOTAL, CKMB, CKMBINDEX, TROPONINI in the last 168 hours. BNP (last 3 results) No results for input(s): PROBNP in the last 8760 hours. HbA1C: No results for input(s): HGBA1C in the last 72 hours. CBG: No results for input(s): GLUCAP in the last 168 hours. Lipid Profile: No results for input(s): CHOL, HDL, LDLCALC, TRIG, CHOLHDL, LDLDIRECT in the last 72 hours. Thyroid Function Tests: No results for input(s): TSH, T4TOTAL, FREET4, T3FREE, THYROIDAB in the last 72 hours. Anemia Panel:  Recent Labs  06/06/17 0602  VITAMINB12 984*   Sepsis Labs:  Recent Labs Lab 06/05/17 1801 06/05/17 2101  LATICACIDVEN 1.99* 1.84    Recent Results (from the past 240 hour(s))  Urine culture     Status: Abnormal   Collection Time: 06/05/17  8:05 PM  Result Value Ref Range Status   Specimen Description URINE, RANDOM  Final   Special Requests NONE  Final   Culture >=100,000 COLONIES/mL ESCHERICHIA COLI (A)  Final   Report Status 06/08/2017 FINAL  Final   Organism ID, Bacteria ESCHERICHIA COLI (A)  Final      Susceptibility   Escherichia coli - MIC*    AMPICILLIN 4 SENSITIVE Sensitive     CEFAZOLIN <=4 SENSITIVE Sensitive     CEFTRIAXONE <=1 SENSITIVE Sensitive     CIPROFLOXACIN <=0.25 SENSITIVE Sensitive      GENTAMICIN <=1 SENSITIVE Sensitive     IMIPENEM <=0.25 SENSITIVE Sensitive     NITROFURANTOIN <=16 SENSITIVE Sensitive     TRIMETH/SULFA <=20 SENSITIVE Sensitive     AMPICILLIN/SULBACTAM <=2 SENSITIVE Sensitive     PIP/TAZO <=4 SENSITIVE Sensitive     Extended ESBL NEGATIVE Sensitive     * >=100,000 COLONIES/mL ESCHERICHIA COLI         Radiology Studies: Dg Cholangiogram Operative  Result Date: 06/08/2017 CLINICAL DATA:  56 year old female with gallstone pancreatitis. EXAM: INTRAOPERATIVE CHOLANGIOGRAM TECHNIQUE: Cholangiographic images from the C-arm fluoroscopic device were submitted for interpretation post-operatively. Please see the procedural report for the amount of contrast and the fluoroscopy time utilized. COMPARISON:  MRI of the abdomen 06/06/2017 FINDINGS: Cine clip obtained during intraoperative cholangiogram at the time of laparoscopic cholecystectomy submitted for evaluation. The images demonstrate cannulation of the cystic duct remanent and opacification of the biliary tree. No  evidence of biliary ductal dilatation, stenosis, stricture or choledocholithiasis. Contrast material passes freely into the duodenum. IMPRESSION: Negative intraoperative cholangiogram. Electronically Signed   By: Jacqulynn Cadet M.D.   On: 06/08/2017 09:54        Scheduled Meds: . enoxaparin (LOVENOX) injection  40 mg Subcutaneous Q24H  . feeding supplement  1 Container Oral TID BM  . folic acid  1 mg Oral Daily  . multivitamin with minerals  1 tablet Oral Daily  . thiamine  100 mg Oral Daily  . zonisamide  100 mg Oral BID   Continuous Infusions: . 0.9 % NaCl with KCl 40 mEq / L 125 mL/hr (06/08/17 1125)  . lactated ringers Stopped (06/08/17 1120)     LOS: 3 days        Aline August, MD [facility] Pager [phone]  If 7PM-7AM, please contact night-coverage [email or web address] Password [email or web address] 06/08/2017, 2:54 PM

## 2017-06-08 NOTE — Progress Notes (Signed)
Day of Surgery   Subjective/Chief Complaint: Patient less tender today, but still has some epigastric/ LUQ discomfort  Lipase continues to decrease - almost normal   Objective: Vital signs in last 24 hours: Temp:  [98 F (36.7 C)-98.6 F (37 C)] 98.6 F (37 C) (08/17 0554) Pulse Rate:  [69-72] 71 (08/17 0554) Resp:  [17-18] 18 (08/17 0554) BP: (122-139)/(79-87) 122/79 (08/17 0554) SpO2:  [100 %] 100 % (08/17 0554) Last BM Date: 06/05/17  Intake/Output from previous day: 08/16 0701 - 08/17 0700 In: 120 [P.O.:120] Out: -  Intake/Output this shift: No intake/output data recorded.  General appearance: alert, cooperative and no distress GI: soft, mild epigastric/ LUQ tenderness  Lab Results:   Recent Labs  06/07/17 0445 06/08/17 0528  WBC 8.2 6.1  HGB 9.5* 10.1*  HCT 28.3* 30.0*  PLT 187 177   BMET  Recent Labs  06/07/17 0445 06/08/17 0528  NA 136 134*  K 3.1* 3.3*  CL 107 108  CO2 23 19*  GLUCOSE 105* 98  BUN 5* <5*  CREATININE 0.56 0.58  CALCIUM 7.8* 8.1*   Hepatic Function Latest Ref Rng & Units 06/08/2017 06/07/2017 06/06/2017  Total Protein 6.5 - 8.1 g/dL 5.5(L) 5.4(L) 6.0(L)  Albumin 3.5 - 5.0 g/dL 2.1(L) 2.1(L) 2.4(L)  AST 15 - 41 U/L 49(H) 47(H) 61(H)  ALT 14 - 54 U/L 23 22 28   Alk Phosphatase 38 - 126 U/L 138(H) 124 149(H)  Total Bilirubin 0.3 - 1.2 mg/dL 0.6 0.8 1.3(H)  Bilirubin, Direct 0.1 - 0.5 mg/dL - - -   Lipase     Component Value Date/Time   LIPASE 84 (H) 06/08/2017 0528    PT/INR No results for input(s): LABPROT, INR in the last 72 hours. ABG No results for input(s): PHART, HCO3 in the last 72 hours.  Invalid input(s): PCO2, PO2  Studies/Results: Mr 3d Recon At Scanner  Result Date: 06/06/2017 CLINICAL DATA:  Inpatient. Acute pancreatitis. Cholelithiasis. Abnormal liver function tests with mildly elevated total bilirubin level. EXAM: MRI ABDOMEN WITHOUT AND WITH CONTRAST (INCLUDING MRCP) TECHNIQUE: Multiplanar multisequence  MR imaging of the abdomen was performed both before and after the administration of intravenous contrast. Heavily T2-weighted images of the biliary and pancreatic ducts were obtained, and three-dimensional MRCP images were rendered by post processing. CONTRAST:  15m MULTIHANCE GADOBENATE DIMEGLUMINE 529 MG/ML IV SOLN COMPARISON:  06/05/2017 CT abdomen/pelvis. FINDINGS: Lower chest: Mild hypoventilatory changes in the dependent lung bases. Hepatobiliary: Normal liver size and configuration. Heterogeneous moderate to severe diffuse hepatic steatosis. Minimally complex 1.1 cm lateral segment left liver lobe cyst with thin internal septation. Additional scattered simple subcentimeter liver cysts. No suspicious liver masses. Multiple subcentimeter gallstones and sludge are seen layering in the nondistended gallbladder, with no gallbladder wall thickening or pericholecystic fluid. No biliary ductal dilatation. Common bile duct diameter 4 mm. No choledocholithiasis. No biliary strictures or masses. No ampullary mass. Pancreas: There is diffuse pancreatic parenchymal and peripancreatic edema, compatible with acute pancreatitis. No measurable peripancreatic fluid collections. Pancreatic parenchymal enhancement is preserved. No pancreatic mass or duct dilation. No pancreas divisum. Spleen: Normal size. No mass. Adrenals/Urinary Tract: Normal adrenals. No hydronephrosis. There is a 1.5 cm renal cortical mass in the upper right kidney (series 1300/ image 46), which demonstrates relatively uniform precontrast T1 hyperintensity, heterogeneous T2 hypointensity and no convincing enhancement, compatible with a Bosniak category 2 hemorrhagic/ proteinaceous renal cyst. Additional scattered simple subcentimeter renal cysts are noted in the mid to upper right kidney. No suspicious renal masses. Stomach/Bowel:  Grossly normal stomach. Visualized small and large bowel is normal caliber, with no bowel wall thickening. Vascular/Lymphatic:  Atherosclerotic nonaneurysmal abdominal aorta. Patent portal, splenic, hepatic and renal veins. No pathologically enlarged lymph nodes in the abdomen. Other: Trace perihepatic and perisplenic ascites. Inflammatory edema tracks into the anterior paranephric retroperitoneal spaces bilaterally. No focal fluid collections. Musculoskeletal: No aggressive appearing focal osseous lesions. IMPRESSION: 1. Acute uncomplicated pancreatitis. No measurable peripancreatic fluid collections. Trace perihepatic and perisplenic ascites. 2. Cholelithiasis. No evidence of acute cholecystitis. No biliary ductal dilatation. No choledocholithiasis. 3. Moderate to severe diffuse hepatic steatosis. 4. Small Bosniak category 2 hemorrhagic/proteinaceous upper right renal cyst. 5.  Aortic Atherosclerosis (ICD10-I70.0). Electronically Signed   By: Ilona Sorrel M.D.   On: 06/06/2017 11:58   Mr Abdomen Mrcp Moise Boring Contast  Result Date: 06/06/2017 CLINICAL DATA:  Inpatient. Acute pancreatitis. Cholelithiasis. Abnormal liver function tests with mildly elevated total bilirubin level. EXAM: MRI ABDOMEN WITHOUT AND WITH CONTRAST (INCLUDING MRCP) TECHNIQUE: Multiplanar multisequence MR imaging of the abdomen was performed both before and after the administration of intravenous contrast. Heavily T2-weighted images of the biliary and pancreatic ducts were obtained, and three-dimensional MRCP images were rendered by post processing. CONTRAST:  54m MULTIHANCE GADOBENATE DIMEGLUMINE 529 MG/ML IV SOLN COMPARISON:  06/05/2017 CT abdomen/pelvis. FINDINGS: Lower chest: Mild hypoventilatory changes in the dependent lung bases. Hepatobiliary: Normal liver size and configuration. Heterogeneous moderate to severe diffuse hepatic steatosis. Minimally complex 1.1 cm lateral segment left liver lobe cyst with thin internal septation. Additional scattered simple subcentimeter liver cysts. No suspicious liver masses. Multiple subcentimeter gallstones and sludge are  seen layering in the nondistended gallbladder, with no gallbladder wall thickening or pericholecystic fluid. No biliary ductal dilatation. Common bile duct diameter 4 mm. No choledocholithiasis. No biliary strictures or masses. No ampullary mass. Pancreas: There is diffuse pancreatic parenchymal and peripancreatic edema, compatible with acute pancreatitis. No measurable peripancreatic fluid collections. Pancreatic parenchymal enhancement is preserved. No pancreatic mass or duct dilation. No pancreas divisum. Spleen: Normal size. No mass. Adrenals/Urinary Tract: Normal adrenals. No hydronephrosis. There is a 1.5 cm renal cortical mass in the upper right kidney (series 1300/ image 46), which demonstrates relatively uniform precontrast T1 hyperintensity, heterogeneous T2 hypointensity and no convincing enhancement, compatible with a Bosniak category 2 hemorrhagic/ proteinaceous renal cyst. Additional scattered simple subcentimeter renal cysts are noted in the mid to upper right kidney. No suspicious renal masses. Stomach/Bowel: Grossly normal stomach. Visualized small and large bowel is normal caliber, with no bowel wall thickening. Vascular/Lymphatic: Atherosclerotic nonaneurysmal abdominal aorta. Patent portal, splenic, hepatic and renal veins. No pathologically enlarged lymph nodes in the abdomen. Other: Trace perihepatic and perisplenic ascites. Inflammatory edema tracks into the anterior paranephric retroperitoneal spaces bilaterally. No focal fluid collections. Musculoskeletal: No aggressive appearing focal osseous lesions. IMPRESSION: 1. Acute uncomplicated pancreatitis. No measurable peripancreatic fluid collections. Trace perihepatic and perisplenic ascites. 2. Cholelithiasis. No evidence of acute cholecystitis. No biliary ductal dilatation. No choledocholithiasis. 3. Moderate to severe diffuse hepatic steatosis. 4. Small Bosniak category 2 hemorrhagic/proteinaceous upper right renal cyst. 5.  Aortic  Atherosclerosis (ICD10-I70.0). Electronically Signed   By: JIlona SorrelM.D.   On: 06/06/2017 11:58    Anti-infectives: Anti-infectives    Start     Dose/Rate Route Frequency Ordered Stop   06/08/17 0815  ceFAZolin (ANCEF) IVPB 2g/100 mL premix     2 g 200 mL/hr over 30 Minutes Intravenous On call to O.R. 06/08/17 0812 06/09/17 0559      Assessment/Plan:  Pancreatitis - lipase  trending down T.bili back down to normal Possible etiologies for pancreatitis - gallstones/ new seizure meds/ EtOH  Will plan laparoscopic cholecystectomy with cholangiogram today.  The surgical procedure has been discussed with the patient.  Potential risks, benefits, alternative treatments, and expected outcomes have been explained.  All of the patient's questions at this time have been answered.  The likelihood of reaching the patient's treatment goal is good.  The patient understand the proposed surgical procedure and wishes to proceed.   LOS: 3 days    Cynthia Charles K. 06/08/2017

## 2017-06-08 NOTE — Transfer of Care (Signed)
Immediate Anesthesia Transfer of Care Note  Patient: Cynthia Welch  Procedure(s) Performed: Procedure(s): LAPAROSCOPIC CHOLECYSTECTOMY WITH INTRAOPERATIVE CHOLANGIOGRAM (N/A)  Patient Location: PACU  Anesthesia Type:General  Level of Consciousness: awake, alert  and patient cooperative  Airway & Oxygen Therapy: Patient Spontanous Breathing  Post-op Assessment: Report given to RN and Post -op Vital signs reviewed and stable  Post vital signs: Reviewed and stable  Last Vitals:  Vitals:   06/07/17 2125 06/08/17 0554  BP: 131/87 122/79  Pulse: 72 71  Resp: 18 18  Temp: 36.8 C 37 C  SpO2: 100% 100%    Last Pain:  Vitals:   06/08/17 0554  TempSrc: Oral  PainSc:       Patients Stated Pain Goal: 2 (82/41/75 3010)  Complications: No apparent anesthesia complications

## 2017-06-08 NOTE — Anesthesia Postprocedure Evaluation (Signed)
Anesthesia Post Note  Patient: Cynthia Welch  Procedure(s) Performed: Procedure(s) (LRB): LAPAROSCOPIC CHOLECYSTECTOMY WITH INTRAOPERATIVE CHOLANGIOGRAM (N/A)     Patient location during evaluation: PACU Anesthesia Type: General Level of consciousness: awake and alert Pain management: pain level controlled Vital Signs Assessment: post-procedure vital signs reviewed and stable Respiratory status: spontaneous breathing, nonlabored ventilation, respiratory function stable and patient connected to nasal cannula oxygen Cardiovascular status: blood pressure returned to baseline and stable Postop Assessment: no signs of nausea or vomiting Anesthetic complications: no    Last Vitals:  Vitals:   06/08/17 1045 06/08/17 1052  BP:  127/82  Pulse: 72 70  Resp: 15 14  Temp:  36.6 C  SpO2: 97% 97%    Last Pain:  Vitals:   06/08/17 1308  TempSrc:   PainSc: 7                  Claira Jeter P Neiman Roots

## 2017-06-09 ENCOUNTER — Encounter (HOSPITAL_COMMUNITY): Payer: Self-pay | Admitting: Surgery

## 2017-06-09 LAB — COMPREHENSIVE METABOLIC PANEL
ALBUMIN: 2 g/dL — AB (ref 3.5–5.0)
ALK PHOS: 153 U/L — AB (ref 38–126)
ALT: 35 U/L (ref 14–54)
ANION GAP: 6 (ref 5–15)
AST: 95 U/L — AB (ref 15–41)
BILIRUBIN TOTAL: 0.6 mg/dL (ref 0.3–1.2)
CALCIUM: 8.3 mg/dL — AB (ref 8.9–10.3)
CO2: 20 mmol/L — AB (ref 22–32)
CREATININE: 0.53 mg/dL (ref 0.44–1.00)
Chloride: 110 mmol/L (ref 101–111)
GFR calc Af Amer: >60 mL/min (ref >60)
GFR calc non Af Amer: >60 mL/min (ref >60)
GLUCOSE: 109 mg/dL — AB (ref 65–99)
Potassium: 3.5 mmol/L (ref 3.5–5.1)
SODIUM: 136 mmol/L (ref 135–145)
TOTAL PROTEIN: 5.2 g/dL — AB (ref 6.5–8.1)

## 2017-06-09 LAB — CBC WITH DIFFERENTIAL/PLATELET
BASOS ABS: 0 10*3/uL (ref 0.0–0.1)
BASOS PCT: 0 %
EOS ABS: 0.1 10*3/uL (ref 0.0–0.7)
Eosinophils Relative: 2 %
HEMATOCRIT: 27.9 % — AB (ref 36.0–46.0)
HEMOGLOBIN: 9.2 g/dL — AB (ref 12.0–15.0)
Lymphocytes Relative: 22 %
Lymphs Abs: 1.5 10*3/uL (ref 0.7–4.0)
MCH: 34.1 pg — ABNORMAL HIGH (ref 26.0–34.0)
MCHC: 33 g/dL (ref 30.0–36.0)
MCV: 103.3 fL — ABNORMAL HIGH (ref 78.0–100.0)
MONOS PCT: 10 %
Monocytes Absolute: 0.7 10*3/uL (ref 0.1–1.0)
NEUTROS ABS: 4.4 10*3/uL (ref 1.7–7.7)
NEUTROS PCT: 66 %
Platelets: 182 10*3/uL (ref 150–400)
RBC: 2.7 MIL/uL — AB (ref 3.87–5.11)
RDW: 12.4 % (ref 11.5–15.5)
WBC: 6.8 10*3/uL (ref 4.0–10.5)

## 2017-06-09 LAB — MAGNESIUM: Magnesium: 1.7 mg/dL (ref 1.7–2.4)

## 2017-06-09 MED ORDER — ALUM & MAG HYDROXIDE-SIMETH 200-200-20 MG/5ML PO SUSP
30.0000 mL | Freq: Four times a day (QID) | ORAL | Status: DC | PRN
  Administered 2017-06-09: 30 mL via ORAL
  Filled 2017-06-09: qty 30

## 2017-06-09 MED ORDER — TRAMADOL HCL 50 MG PO TABS: 50.0000 mg | ORAL_TABLET | Freq: Four times a day (QID) | ORAL | Status: DC | PRN

## 2017-06-09 MED ORDER — ADULT MULTIVITAMIN W/MINERALS CH: 1.0000 | ORAL_TABLET | Freq: Every day | ORAL | 0 refills | Status: AC

## 2017-06-09 MED ORDER — FAMOTIDINE 20 MG PO TABS
20.0000 mg | ORAL_TABLET | Freq: Once | ORAL | Status: AC
  Administered 2017-06-09: 20 mg via ORAL
  Filled 2017-06-09: qty 1

## 2017-06-09 MED ORDER — TRAMADOL HCL 50 MG PO TABS: 50.0000 mg | ORAL_TABLET | Freq: Four times a day (QID) | ORAL | 0 refills | Status: DC | PRN

## 2017-06-09 NOTE — Progress Notes (Signed)
Pt asked for med for heart burn, paged the on call put an order in for PO pepcid, went to pt room and was asleep will continue to monitor pt and give it if needed

## 2017-06-09 NOTE — Progress Notes (Signed)
[facility] Progress Note  1 Day Post-Op  Subjective: CC:  Abdominal pain improved - pain now more generalized soreness from surgery and less epigastric burning. Tolerating clears. Mobilizing.  Still taking IV dilaudid and not using PO meds - reports ULTRAM has worked in past. Objective: Vital signs in last 24 hours: Temp:  [97.6 F (36.4 C)-98.5 F (36.9 C)] 97.6 F (36.4 C) (08/18 0504) Pulse Rate:  [69-94] 69 (08/18 0504) Resp:  [12-18] 18 (08/18 0504) BP: (104-127)/(72-86) 123/77 (08/18 0504) SpO2:  [97 %-100 %] 100 % (08/18 0504) Weight:  [73.9 kg (163 lb)] 73.9 kg (163 lb) (08/18 0504) Last BM Date: 06/05/17  Intake/Output from previous day: 08/17 0701 - 08/18 0700 In: 5690 [P.O.:830; I.V.:4623] Out: 20 [Blood:20] Intake/Output this shift: No intake/output data recorded.  PE: Gen:  Alert, NAD, pleasant Pulm:  Normal effort Abd: Soft, appropriately tender, non-distended, bowel sounds present in all 4 quadrants, incisions C/D/I Skin: warm and dry, no rashes  Psych: A&Ox3   Lab Results:   Recent Labs  06/08/17 0528 06/09/17 0451  WBC 6.1 6.8  HGB 10.1* 9.2*  HCT 30.0* 27.9*  PLT 177 182   BMET  Recent Labs  06/08/17 0528 06/09/17 0451  NA 134* 136  K 3.3* 3.5  CL 108 110  CO2 19* 20*  GLUCOSE 98 109*  BUN <5* <5*  CREATININE 0.58 0.53  CALCIUM 8.1* 8.3*   PT/INR No results for input(s): LABPROT, INR in the last 72 hours. CMP     Component Value Date/Time   NA 136 06/09/2017 0451   K 3.5 06/09/2017 0451   CL 110 06/09/2017 0451   CO2 20 (L) 06/09/2017 0451   GLUCOSE 109 (H) 06/09/2017 0451   BUN <5 (L) 06/09/2017 0451   CREATININE 0.53 06/09/2017 0451   CALCIUM 8.3 (L) 06/09/2017 0451   PROT 5.2 (L) 06/09/2017 0451   ALBUMIN 2.0 (L) 06/09/2017 0451   AST 95 (H) 06/09/2017 0451   ALT 35 06/09/2017 0451   ALKPHOS 153 (H) 06/09/2017 0451   BILITOT 0.6 06/09/2017 0451   GFRNONAA >60 06/09/2017 0451   GFRAA >60 06/09/2017  0451   Lipase     Component Value Date/Time   LIPASE 84 (H) 06/08/2017 0528       Studies/Results: Dg Cholangiogram Operative  Result Date: 06/08/2017 CLINICAL DATA:  56 year old female with gallstone pancreatitis. EXAM: INTRAOPERATIVE CHOLANGIOGRAM TECHNIQUE: Cholangiographic images from the C-arm fluoroscopic device were submitted for interpretation post-operatively. Please see the procedural report for the amount of contrast and the fluoroscopy time utilized. COMPARISON:  MRI of the abdomen 06/06/2017 FINDINGS: Cine clip obtained during intraoperative cholangiogram at the time of laparoscopic cholecystectomy submitted for evaluation. The images demonstrate cannulation of the cystic duct remanent and opacification of the biliary tree. No evidence of biliary ductal dilatation, stenosis, stricture or choledocholithiasis. Contrast material passes freely into the duodenum. IMPRESSION: Negative intraoperative cholangiogram. Electronically Signed   By: Jacqulynn Cadet M.D.   On: 06/08/2017 09:54    Anti-infectives: Anti-infectives    Start     Dose/Rate Route Frequency Ordered Stop   06/08/17 0815  ceFAZolin (ANCEF) IVPB 2g/100 mL premix     2 g 200 mL/hr over 30 Minutes Intravenous On call to O.R. 06/08/17 0812 06/08/17 0858     Assessment/Plan Biliary pancreatitis  S/P Laparoscopic cholecystectomy 06/08/17, Dr. Donnie Mesa  Total bilirubin 0.6 post-operatively  Tolerating clears - advance to low fat diet as tolerated   IS/pulmonary toilet and OOB/ambulate  PO pain control with PRN ULTRAM (allergic to tylenol and oxy)  FEN: clears, advance as tolerated to low fat ID: perioperative ancef VTE: SCD's, lovenox  Plan -- stable for discharge this afternoon from surgical perspective if tolerating SOFT/low fat diet and pain controlled on PO meds Follow-up and discharge instructions provided.     LOS: 4 days    [facility] 06/09/2017, 8:01  AM Pager: [phone] Consults: [phone] Mon-Fri 7:00 am-4:30 pm Sat-Sun 7:00 am-11:30 am

## 2017-06-09 NOTE — Care Management Note (Signed)
Case Management Note  Patient Details  Name: Cynthia Welch MRN: [id number] Date of Birth: 03/07/1961  Subjective/Objective:   Pt admitted with acute epigastric pain and vomiting                 Action/Plan:  PTA from home independent.  Pt was recently admitted for  seizures - pt recently changed medication to [place].  Pt states [facility] , and she is able to afford prescriptions through a program with [facility] - pt denied needing medication assistance.  NO CM needs determined at time this note was written - if new needs arise please contact CM department     Expected Discharge Date:  06/09/17               Expected Discharge Plan:  Home/Self Care  In-House Referral:     Discharge planning Services  CM Consult  Post Acute Care Choice:    Choice offered to:     DME Arranged:    DME Agency:     HH Arranged:    [place] Agency:     Status of Service:  Completed, signed off  If discussed at [employer] of Stay Meetings, dates discussed:    Additional Comments:  Maryclare Labrador, RN 06/09/2017, 11:20 AM

## 2017-06-09 NOTE — Discharge Instructions (Signed)

## 2017-06-09 NOTE — Discharge Summary (Signed)
Physician Discharge Summary  Araiya Tilmon ZWC:[id number] DOB: 1961/04/15 DOA: 06/05/2017  PCP: Rogers Blocker, MD  Admit date: 06/05/2017 Discharge date: 06/09/2017  Admitted From: Home Disposition:  Home  Recommendations for Outpatient Follow-up:  1. Follow up with PCP in 1week 2. Follow-up with general surgery in 1week 3. Soft/low fat diet until seen by general surgery as an outpatient  Home Health: No  Equipment/Devices: None  Discharge Condition: Stable CODE STATUS: Full Diet recommendation: Heart Healthy / soft/low fat diet  Brief/Interim Summary: 56 year old female with history of alcohol abuse, seizures and hypertension presented with epigastric pain and vomiting. She was admitted with acute pancreatitis most recent alcohol started on IV fluids and IV analgesics. CT abdomen showed colitis and cholelithiasis. MRCP did not show any choledocholithiasis. Patient underwent laparoscopic cholecystectomy on 06/08/2017. Surgery has cleared the patient for discharge.   Discharge Diagnoses:  Principal Problem:   Acute pancreatitis Active Problems:   Elevated LFTs   Hypokalemia   Protein-calorie malnutrition, severe   Seizure (HCC)   Alcohol abuse with intoxication (HCC)   Macrocytic anemia   Malnutrition of moderate degree   Acute pancreatitis, suspect mostly alcohol induced: - CT abdomen showed cholelithiasis. MRCP did not show choledocholithiasis. Zonegran not frequent cause of pancreatitis. -Patient is tolerating diet. Counseled about alcohol cessation  Cholelithiasis - Status post laparoscopic cholecystectomy on 06/08/2017.  - Patient has been cleared for discharge by general surgery if she tolerates soft diet for lunch. Outpatient follow-up with general surgery  Hypokalemia: From alcohol use. Resolved  Hypertension: Blood pressure on the lower side. Hold antihypertensives for now and outpatient follow-up with primary care provider  Macrocytic  anemia: Likely from alcohol.   Seizures: No recent seizure activity -Continue Zonegran   Alcoholism: -Cessation recommended, patient wants to quit, however, does not feel at all that she has dependence issues -No signs of withdrawal currently. -Continue outpatient thiamine, folic acid, multivitamin  Hypomagnesemia -Improved.  Probable asymptomatic bacteriuria - Monitor off antibiotics  Probable moderate malnutrition - Follow nutrition recommendations   Discharge Instructions  Discharge Instructions    Call MD for:  difficulty breathing, headache or visual disturbances    Complete by:  As directed    Call MD for:  extreme fatigue    Complete by:  As directed    Call MD for:  hives    Complete by:  As directed    Call MD for:  persistant dizziness or light-headedness    Complete by:  As directed    Call MD for:  persistant nausea and vomiting    Complete by:  As directed    Call MD for:  severe uncontrolled pain    Complete by:  As directed    Call MD for:  temperature >100.4    Complete by:  As directed    Diet - low sodium heart healthy    Complete by:  As directed    Increase activity slowly    Complete by:  As directed      Allergies as of 06/09/2017      Reactions   Oxycodone Hcl Itching   Acetaminophen Itching      Medication List    STOP taking these medications   triamterene-hydrochlorothiazide 37.5-25 MG tablet Commonly known as:  MAXZIDE-25     TAKE these medications   folic acid 1 MG tablet Commonly known as:  FOLVITE Take 1 tablet (1 mg total) by mouth daily.   multivitamin with minerals Tabs tablet Take 1 tablet by mouth  daily.   omeprazole 40 MG capsule Commonly known as:  PRILOSEC Take 40 mg by mouth daily.   thiamine 100 MG tablet Take 1 tablet (100 mg total) by mouth daily.   traMADol 50 MG tablet Commonly known as:  ULTRAM Take 1 tablet (50 mg total) by mouth every 6 (six) hours as needed for moderate pain or severe  pain.   Vitamin D3 2000 units Tabs Take 2,000 Units by mouth daily.   zonisamide 100 MG capsule Commonly known as:  ZONEGRAN By mouth, 100 mg daily for 2 weeks followed by 100 mg twice a day.       Follow-up [facility], [place]. Call.   Specialty:  General Surgery Why:  as soon as possible to confirm post-operative follow up appointment date/time.  Contact information: [address] [place] [phone]       Rogers Blocker, MD Follow up in 1 week(s).   Specialty:  Internal Medicine Contact information: [place] [address] [phone]          Allergies  Allergen Reactions  . Oxycodone Hcl Itching  . Acetaminophen Itching    Consultations:  General surgery   Procedures/Studies: Dg Chest 2 View  Result Date: 05/15/2017 CLINICAL DATA:  Chest pain and shortness of breath for 2 days. EXAM: CHEST  2 VIEW COMPARISON:  Chest x-ray dated April 29, 2017. FINDINGS: The cardiomediastinal silhouette is normal in size. Normal pulmonary vascularity. No focal consolidation, pleural effusion, or pneumothorax. No acute osseous abnormality. IMPRESSION: No active cardiopulmonary disease. Electronically Signed   By: Titus Dubin M.D.   On: 05/15/2017 13:11   Dg Cholangiogram Operative  Result Date: 06/08/2017 CLINICAL DATA:  56 year old female with gallstone pancreatitis. EXAM: INTRAOPERATIVE CHOLANGIOGRAM TECHNIQUE: Cholangiographic images from the C-arm fluoroscopic device were submitted for interpretation post-operatively. Please see the procedural report for the amount of contrast and the fluoroscopy time utilized. COMPARISON:  MRI of the abdomen 06/06/2017 FINDINGS: Cine clip obtained during intraoperative cholangiogram at the time of laparoscopic cholecystectomy submitted for evaluation. The images demonstrate cannulation of the cystic duct remanent and opacification of the biliary  tree. No evidence of biliary ductal dilatation, stenosis, stricture or choledocholithiasis. Contrast material passes freely into the duodenum. IMPRESSION: Negative intraoperative cholangiogram. Electronically Signed   By: Jacqulynn Cadet M.D.   On: 06/08/2017 09:54   Mr Jeri Cos UY Contrast  Result Date: 05/16/2017 CLINICAL DATA:  Seizure disorder. EXAM: MRI HEAD WITHOUT AND WITH CONTRAST TECHNIQUE: Multiplanar, multiecho pulse sequences of the brain and surrounding structures were obtained without and with intravenous contrast. CONTRAST:  10mL MULTIHANCE GADOBENATE DIMEGLUMINE 529 MG/ML IV SOLN COMPARISON:  CT head without contrast 04/29/2017. MRI brain 01/28/2013 FINDINGS: Brain: Previously noted extra-axial collections have resolved. Mild atrophy and white matter changes are evident. No acute infarct, hemorrhage, or mass lesion is present. Dedicated imaging of the temporal lobes demonstrates normal size and signal of the hippocampal structures bilaterally. No mass lesion is present. The postcontrast images demonstrate no pathologic enhancement. Vascular: Normal flow voids. Skull and upper cervical spine: The skullbase is within normal limits. The craniocervical junction is normal. The upper cervical spine demonstrates mild degenerative change at C4-5. Marrow signal is normal. Midline sagittal structures are within normal limits. Sinuses/Orbits: The paranasal sinuses and mastoid air cells are clear. The globes and orbits are within normal limits. IMPRESSION: 1. Interval resolution of bilateral subdural collections. 2. Stable mild atrophy and white  matter disease. This is mildly advanced for age. This may be related to alcohol abuse or chronic microvascular ischemia. 3. No acute intracranial abnormality. 4. No discrete seizure focus. Electronically Signed   By: San Morelle M.D.   On: 05/16/2017 11:07   Ct Abdomen Pelvis W Contrast  Result Date: 06/05/2017 CLINICAL DATA:  Abdominal pain and  vomiting. History of pancreatitis EXAM: CT ABDOMEN AND PELVIS WITH CONTRAST TECHNIQUE: Multidetector CT imaging of the abdomen and pelvis was performed using the standard protocol following bolus administration of intravenous contrast. CONTRAST:  168mL ISOVUE-300 IOPAMIDOL (ISOVUE-300) INJECTION 61% COMPARISON:  None. FINDINGS: Lower chest: Lung bases are clear. 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 Hepatobiliary: There is diffuse hepatic steatosis. No focal liver lesions are evident. There is cholelithiasis. Gallbladder wall does appreciably thickened. There is no biliary duct dilatation. Pancreas: There is peripancreatic fluid surrounding virtually the entire pancreas. Fluid extends into the left upper quadrant to abut a portion of the greater curvature of the stomach. There is mild loculated fluid between the stomach and spleen in the left upper quadrant. Fluid tracks posteriorly on the left to the level of the anterior perinephric fascia. Fluid is also seen tracking into the lateral conal fascia on the left. There is subtle pancreatic edema throughout most of the pancreas. There is no pancreatic mass or pseudocyst. No pancreatic duct dilatation. Spleen: No splenic lesions are evident. There is a cleft in the lateral midportion of the spleen, either an anatomic variant or residua of prior splenic trauma. Adrenals/Urinary tract: Adrenals appear normal bilaterally. Note that a small amount of peripancreatic fluid abuts the lateral limb of the left adrenal. There is a cyst arising from the posterior upper pole right kidney measuring 1.5 x 1.5 cm. There is a cyst in the medial right kidney upper portion measuring 1.0 x 1.0 cm. There is no hydronephrosis on either side. There is no renal or ureteral calculus on either side. Urinary bladder is midline with wall thickness within normal limits. Stomach/Bowel: There are scattered sigmoid diverticula without diverticulitis. There is  no appreciable bowel wall or mesenteric thickening. No evident bowel obstruction. No free air or portal venous air. Vascular/Lymphatic: There are foci of atherosclerotic calcification in the aorta and common iliac arteries. No aneurysm evident. Major mesenteric vessels appear patent. There is no adenopathy in the abdomen or pelvis. Reproductive: Uterus is absent.  There is no evident pelvic mass. Other: Appendix appears unremarkable. There is no abscess in the abdomen or pelvis. Musculoskeletal: There is degenerative change in the lumbar spine with vacuum phenomenon at L4-5 and L5-S1. There are no blastic or lytic bone lesions. There is no intramuscular or abdominal wall lesions. IMPRESSION: 1. Acute pancreatitis with peripancreatic fluid and mild pancreatic edema. Fluid tracks into the left upper quadrant to abut the greater curvature the stomach as well as demonstrated loculated fluid between the greater curvature of the stomach and spleen. Fluid tracks on the left into the retroperitoneum with fluid abutting the lateral limb of the adrenal and extending to the anterior perinephric fascia on the left. Fluid tracks more inferiorly into the left lateral conal fascia. There is no evident mass or pseudocyst. No appreciable pancreatic duct dilatation. 2.  Cholelithiasis.  No appreciable gallbladder wall thickening. 3.  Diffuse hepatic steatosis. 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 Question residua of old trauma versus anatomic variant. 5. No bowel obstruction. No abscess. Scattered sigmoid diverticula without diverticulitis. Appendix appears normal. 6.  No renal or ureteral calculus.  No hydronephrosis. 7.  Foci of aortoiliac atherosclerosis. Aortic Atherosclerosis (ICD10-I70.0). Electronically Signed   By: Lowella Grip III M.D.   On: 06/05/2017 19:49   Mr 3d Recon At Scanner  Result Date: 06/06/2017 CLINICAL DATA:  Inpatient. Acute pancreatitis. Cholelithiasis. Abnormal liver function tests with mildly elevated  total bilirubin level. EXAM: MRI ABDOMEN WITHOUT AND WITH CONTRAST (INCLUDING MRCP) TECHNIQUE: Multiplanar multisequence MR imaging of the abdomen was performed both before and after the administration of intravenous contrast. Heavily T2-weighted images of the biliary and pancreatic ducts were obtained, and three-dimensional MRCP images were rendered by post processing. CONTRAST:  82mL MULTIHANCE GADOBENATE DIMEGLUMINE 529 MG/ML IV SOLN COMPARISON:  06/05/2017 CT abdomen/pelvis. FINDINGS: Lower chest: Mild hypoventilatory changes in the dependent lung bases. Hepatobiliary: Normal liver size and configuration. Heterogeneous moderate to severe diffuse hepatic steatosis. Minimally complex 1.1 cm lateral segment left liver lobe cyst with thin internal septation. Additional scattered simple subcentimeter liver cysts. No suspicious liver masses. Multiple subcentimeter gallstones and sludge are seen layering in the nondistended gallbladder, with no gallbladder wall thickening or pericholecystic fluid. No biliary ductal dilatation. Common bile duct diameter 4 mm. No choledocholithiasis. No biliary strictures or masses. No ampullary mass. Pancreas: There is diffuse pancreatic parenchymal and peripancreatic edema, compatible with acute pancreatitis. No measurable peripancreatic fluid collections. Pancreatic parenchymal enhancement is preserved. No pancreatic mass or duct dilation. No pancreas divisum. Spleen: Normal size. No mass. Adrenals/Urinary Tract: Normal adrenals. No hydronephrosis. There is a 1.5 cm renal cortical mass in the upper right kidney (series 1300/ image 46), which demonstrates relatively uniform precontrast T1 hyperintensity, heterogeneous T2 hypointensity and no convincing enhancement, compatible with a Bosniak category 2 hemorrhagic/ proteinaceous renal cyst. Additional scattered simple subcentimeter renal cysts are noted in the mid to upper right kidney. No suspicious renal masses. Stomach/Bowel: Grossly  normal stomach. Visualized small and large bowel is normal caliber, with no bowel wall thickening. Vascular/Lymphatic: Atherosclerotic nonaneurysmal abdominal aorta. Patent portal, splenic, hepatic and renal veins. No pathologically enlarged lymph nodes in the abdomen. Other: Trace perihepatic and perisplenic ascites. Inflammatory edema tracks into the anterior paranephric retroperitoneal spaces bilaterally. No focal fluid collections. Musculoskeletal: No aggressive appearing focal osseous lesions. IMPRESSION: 1. Acute uncomplicated pancreatitis. No measurable peripancreatic fluid collections. Trace perihepatic and perisplenic ascites. 2. Cholelithiasis. No evidence of acute cholecystitis. No biliary ductal dilatation. No choledocholithiasis. 3. Moderate to severe diffuse hepatic steatosis. 4. Small Bosniak category 2 hemorrhagic/proteinaceous upper right renal cyst. 5.  Aortic Atherosclerosis (ICD10-I70.0). Electronically Signed   By: Ilona Sorrel M.D.   On: 06/06/2017 11:58   Mr Abdomen Mrcp Moise Boring Contast  Result Date: 06/06/2017 CLINICAL DATA:  Inpatient. Acute pancreatitis. Cholelithiasis. Abnormal liver function tests with mildly elevated total bilirubin level. EXAM: MRI ABDOMEN WITHOUT AND WITH CONTRAST (INCLUDING MRCP) TECHNIQUE: Multiplanar multisequence MR imaging of the abdomen was performed both before and after the administration of intravenous contrast. Heavily T2-weighted images of the biliary and pancreatic ducts were obtained, and three-dimensional MRCP images were rendered by post processing. CONTRAST:  44mL MULTIHANCE GADOBENATE DIMEGLUMINE 529 MG/ML IV SOLN COMPARISON:  06/05/2017 CT abdomen/pelvis. FINDINGS: Lower chest: Mild hypoventilatory changes in the dependent lung bases. Hepatobiliary: Normal liver size and configuration. Heterogeneous moderate to severe diffuse hepatic steatosis. Minimally complex 1.1 cm lateral segment left liver lobe cyst with thin internal septation. Additional  scattered simple subcentimeter liver cysts. No suspicious liver masses. Multiple subcentimeter gallstones and sludge are seen layering in the nondistended gallbladder, with no gallbladder  wall thickening or pericholecystic fluid. No biliary ductal dilatation. Common bile duct diameter 4 mm. No choledocholithiasis. No biliary strictures or masses. No ampullary mass. Pancreas: There is diffuse pancreatic parenchymal and peripancreatic edema, compatible with acute pancreatitis. No measurable peripancreatic fluid collections. Pancreatic parenchymal enhancement is preserved. No pancreatic mass or duct dilation. No pancreas divisum. Spleen: Normal size. No mass. Adrenals/Urinary Tract: Normal adrenals. No hydronephrosis. There is a 1.5 cm renal cortical mass in the upper right kidney (series 1300/ image 46), which demonstrates relatively uniform precontrast T1 hyperintensity, heterogeneous T2 hypointensity and no convincing enhancement, compatible with a Bosniak category 2 hemorrhagic/ proteinaceous renal cyst. Additional scattered simple subcentimeter renal cysts are noted in the mid to upper right kidney. No suspicious renal masses. Stomach/Bowel: Grossly normal stomach. Visualized small and large bowel is normal caliber, with no bowel wall thickening. Vascular/Lymphatic: Atherosclerotic nonaneurysmal abdominal aorta. Patent portal, splenic, hepatic and renal veins. No pathologically enlarged lymph nodes in the abdomen. Other: Trace perihepatic and perisplenic ascites. Inflammatory edema tracks into the anterior paranephric retroperitoneal spaces bilaterally. No focal fluid collections. Musculoskeletal: No aggressive appearing focal osseous lesions. IMPRESSION: 1. Acute uncomplicated pancreatitis. No measurable peripancreatic fluid collections. Trace perihepatic and perisplenic ascites. 2. Cholelithiasis. No evidence of acute cholecystitis. No biliary ductal dilatation. No choledocholithiasis. 3. Moderate to severe  diffuse hepatic steatosis. 4. Small Bosniak category 2 hemorrhagic/proteinaceous upper right renal cyst. 5.  Aortic Atherosclerosis (ICD10-I70.0). Electronically Signed   By: Ilona Sorrel M.D.   On: 06/06/2017 11:58    Laparoscopic cholecystectomy on 06/08/2017   Subjective: Patient seen and examined at bedside. She feels much better. She denies any overnight fever, nausea or vomiting.  Discharge Exam: Vitals:   06/08/17 2110 06/09/17 0504  BP: 125/86 123/77  Pulse: 80 69  Resp: 18 18  Temp: 97.7 F (36.5 C) 97.6 F (36.4 C)  SpO2: 100% 100%   Vitals:   06/08/17 1052 06/08/17 1428 06/08/17 2110 06/09/17 0504  BP: 127/82 104/80 125/86 123/77  Pulse: 70 86 80 69  Resp: 14 18 18 18   Temp: 97.8 F (36.6 C) 98.5 F (36.9 C) 97.7 F (36.5 C) 97.6 F (36.4 C)  TempSrc:  Oral Oral Oral  SpO2: 97% 100% 100% 100%  Weight:    73.9 kg (163 lb)  Height:        General: Pt is alert, awake, not in acute distress Cardiovascular: Rate controlled, S1/S2 + Respiratory: Bilateral decreased breath sounds at bases Abdominal: Soft, NT, ND, bowel sounds + Extremities: no edema, no cyanosis    The results of significant diagnostics from this hospitalization (including imaging, microbiology, ancillary and laboratory) are listed below for reference.     Microbiology: Recent Results (from the past 240 hour(s))  Urine culture     Status: Abnormal   Collection Time: 06/05/17  8:05 PM  Result Value Ref Range Status   Specimen Description URINE, RANDOM  Final   Special Requests NONE  Final   Culture >=100,000 COLONIES/mL ESCHERICHIA COLI (A)  Final   Report Status 06/08/2017 FINAL  Final   Organism ID, Bacteria ESCHERICHIA COLI (A)  Final      Susceptibility   Escherichia coli - MIC*    AMPICILLIN 4 SENSITIVE Sensitive     CEFAZOLIN <=4 SENSITIVE Sensitive     CEFTRIAXONE <=1 SENSITIVE Sensitive     CIPROFLOXACIN <=0.25 SENSITIVE Sensitive     GENTAMICIN <=1 SENSITIVE Sensitive      IMIPENEM <=0.25 SENSITIVE Sensitive     NITROFURANTOIN <=16 SENSITIVE  Sensitive     TRIMETH/SULFA <=20 SENSITIVE Sensitive     AMPICILLIN/SULBACTAM <=2 SENSITIVE Sensitive     PIP/TAZO <=4 SENSITIVE Sensitive     Extended ESBL NEGATIVE Sensitive     * >=100,000 COLONIES/mL ESCHERICHIA COLI     Labs: BNP (last 3 results) No results for input(s): BNP in the last 8760 hours. Basic Metabolic Panel:  Recent Labs Lab 06/05/17 1736 06/06/17 0602 06/07/17 0445 06/08/17 0528 06/09/17 0451  NA 141 142 136 134* 136  K 3.2* 3.2* 3.1* 3.3* 3.5  CL 103 108 107 108 110  CO2 22 24 23  19* 20*  GLUCOSE 81 82 105* 98 109*  BUN 10 11 5* <5* <5*  CREATININE 0.85 0.68 0.56 0.58 0.53  CALCIUM 9.0 8.5* 7.8* 8.1* 8.3*  MG 1.7  --  1.4* 1.7 1.7   Liver Function Tests:  Recent Labs Lab 06/05/17 1736 06/06/17 0602 06/07/17 0445 06/08/17 0528 06/09/17 0451  AST 118* 61* 47* 49* 95*  ALT 41 28 22 23  35  ALKPHOS 221* 149* 124 138* 153*  BILITOT 1.7* 1.3* 0.8 0.6 0.6  PROT 7.3 6.0* 5.4* 5.5* 5.2*  ALBUMIN 3.0* 2.4* 2.1* 2.1* 2.0*    Recent Labs Lab 06/05/17 1736 06/07/17 0445 06/08/17 0528  LIPASE 747* 136* 84*   No results for input(s): AMMONIA in the last 168 hours. CBC:  Recent Labs Lab 06/05/17 1736 06/06/17 0602 06/07/17 0445 06/08/17 0528 06/09/17 0451  WBC 8.8 8.7 8.2 6.1 6.8  NEUTROABS  --   --  6.1  --  4.4  HGB 12.7 10.9* 9.5* 10.1* 9.2*  HCT 36.7 33.2*  33.5* 28.3* 30.0* 27.9*  MCV 101.4* 102.5* 102.9* 104.9* 103.3*  PLT 265 232 187 177 182   Cardiac Enzymes: No results for input(s): CKTOTAL, CKMB, CKMBINDEX, TROPONINI in the last 168 hours. BNP: Invalid input(s): POCBNP CBG: No results for input(s): GLUCAP in the last 168 hours. D-Dimer No results for input(s): DDIMER in the last 72 hours. Hgb A1c No results for input(s): HGBA1C in the last 72 hours. Lipid Profile No results for input(s): CHOL, HDL, LDLCALC, TRIG, CHOLHDL, LDLDIRECT in the last 72  hours. Thyroid function studies No results for input(s): TSH, T4TOTAL, T3FREE, THYROIDAB in the last 72 hours.  Invalid input(s): FREET3 Anemia work up No results for input(s): VITAMINB12, FOLATE, FERRITIN, TIBC, IRON, RETICCTPCT in the last 72 hours. Urinalysis    Component Value Date/Time   COLORURINE YELLOW 06/05/2017 2005   APPEARANCEUR CLEAR 06/05/2017 2005   LABSPEC 1.044 (H) 06/05/2017 2005   PHURINE 6.0 06/05/2017 2005   GLUCOSEU NEGATIVE 06/05/2017 2005   HGBUR NEGATIVE 06/05/2017 2005   HGBUR negative 08/26/2010 1059   BILIRUBINUR NEGATIVE 06/05/2017 2005   KETONESUR 80 (A) 06/05/2017 2005   PROTEINUR 30 (A) 06/05/2017 2005   UROBILINOGEN 0.2 05/18/2015 2104   NITRITE POSITIVE (A) 06/05/2017 2005   LEUKOCYTESUR TRACE (A) 06/05/2017 2005   Sepsis Labs Invalid input(s): PROCALCITONIN,  WBC,  LACTICIDVEN Microbiology Recent Results (from the past 240 hour(s))  Urine culture     Status: Abnormal   Collection Time: 06/05/17  8:05 PM  Result Value Ref Range Status   Specimen Description URINE, RANDOM  Final   Special Requests NONE  Final   Culture >=100,000 COLONIES/mL ESCHERICHIA COLI (A)  Final   Report Status 06/08/2017 FINAL  Final   Organism ID, Bacteria ESCHERICHIA COLI (A)  Final      Susceptibility   Escherichia coli - MIC*    AMPICILLIN  4 SENSITIVE Sensitive     CEFAZOLIN <=4 SENSITIVE Sensitive     CEFTRIAXONE <=1 SENSITIVE Sensitive     CIPROFLOXACIN <=0.25 SENSITIVE Sensitive     GENTAMICIN <=1 SENSITIVE Sensitive     IMIPENEM <=0.25 SENSITIVE Sensitive     NITROFURANTOIN <=16 SENSITIVE Sensitive     TRIMETH/SULFA <=20 SENSITIVE Sensitive     AMPICILLIN/SULBACTAM <=2 SENSITIVE Sensitive     PIP/TAZO <=4 SENSITIVE Sensitive     Extended ESBL NEGATIVE Sensitive     * >=100,000 COLONIES/mL ESCHERICHIA COLI     Time coordinating discharge: 35 minutes  SIGNED:   Aline August, MD  [facility] 06/09/2017, 10:10 AM Pager: [phone]  If  7PM-7AM, please contact night-coverage [email or web address] Password [email or web address]

## 2017-06-09 NOTE — Progress Notes (Signed)
[name] to be D/C'd Home per MD order.  Discussed with the patient and all questions fully answered.  VSS, Skin clean, dry and intact without evidence of skin break down, no evidence of skin tears noted. IV catheter discontinued intact. Site without signs and symptoms of complications. Dressing and pressure applied.  An After Visit Summary was printed and given to the patient. Patient received prescription.  D/c education completed with patient/family including follow up instructions, medication list, d/c activities limitations if indicated, with other d/c instructions as indicated by MD - patient able to verbalize understanding, all questions fully answered.   Patient instructed to return to ED, call 911, or call MD for any changes in condition.   Patient escorted via [place], and D/C home via private auto.  Christoper Fabian Keasia Dubose 06/09/2017 3:09 PM

## 2017-07-18 ENCOUNTER — Ambulatory Visit (INDEPENDENT_AMBULATORY_CARE_PROVIDER_SITE_OTHER): Payer: Self-pay | Admitting: Neurology

## 2017-07-18 ENCOUNTER — Encounter: Payer: Self-pay | Admitting: Neurology

## 2017-07-18 VITALS — BP 138/91 | HR 86 | Ht 68.0 in | Wt 149.8 lb

## 2017-07-18 DIAGNOSIS — Z87898 Personal history of other specified conditions: Secondary | ICD-10-CM

## 2017-07-18 DIAGNOSIS — S065XAA Traumatic subdural hemorrhage with loss of consciousness status unknown, initial encounter: Secondary | ICD-10-CM

## 2017-07-18 DIAGNOSIS — S065X9A Traumatic subdural hemorrhage with loss of consciousness of unspecified duration, initial encounter: Secondary | ICD-10-CM

## 2017-07-18 DIAGNOSIS — G40909 Epilepsy, unspecified, not intractable, without status epilepticus: Secondary | ICD-10-CM

## 2017-07-18 DIAGNOSIS — I6200 Nontraumatic subdural hemorrhage, unspecified: Secondary | ICD-10-CM

## 2017-07-18 MED ORDER — ZONISAMIDE 100 MG PO CAPS: ORAL_CAPSULE | ORAL | 11 refills | Status: DC

## 2017-07-18 NOTE — Progress Notes (Signed)
PATIENT: Cynthia Welch DOB: [birth year]  Chief Complaint  Patient presents with  . Seizures    She had her first seizure 4-5 years ago.  Her most recent seizures were in July 2018.  At that time, her Keppra was discontinued and she was placed on zonisamide. Her currently taking zonisamide 100mg , BID.  She has not had any further events since being on this medication.  Marland Kitchen PCP    Rogers Blocker, MD (referred from hospital)     HISTORICAL  Cynthia Welch is a 56 years old female seen in refer by her primary care doctor Rogers Blocker for evaluation of seizure, initial evaluation was on July 18 2017. She is alone at today's clinical visit, she was brought in by her friend.  She has past medical history of heavy alcohol abuse, hypertension, macrocytic anemia, malnutrition of moderate degree, abnormal liver functional test,  I was able to review multiple hospital admissions since July 2018, she was admitted to the hospital from July 8 210 2018 after found unresponsive by her spouse at home, she was diagnosed with seizure versus binge alcohol use, alcohol level was 260 upon admission, critically low potassium less than 2, sodium was 120, echocardiogram and ultrasound of carotid artery showed no significant abnormality.  She was discharged home with Keppra 500 mg twice a day, but she complained of intractable nausea vomiting, was not able to keep any food and drink, readmission at the end of July 2018, CT abdomen showed colitis,cholelithiasis,  had laparoscopic cholecystectomy, had abnormal lipase level,  She is now taking zonisamide 100 mg twice a day, had no recurrent seizure,  She reported a history of seizure since 2013, no warning signs, sudden loss of consciousness, sometimes bumped her head, require stitches, MRI in 2014 showed evidence of subdural hematoma. She stated she has recurrent seizure spells, even while she was not drinking,  She used to work heavy labor job, because of  her multiple recurrent seizures, she is in the process of applying for disability, living with her spouse, not driving, last seizure was in early July 2018.  I personally reviewed and compared MRIs on May 16 2017 to previous MRI in 2014, mild generalized atrophy no acute abnormalities, she had shallow bilateral subdural collections in the right side most consistent with right subdural hematoma 2014, which has resolved at MRI in July 2018.  Most recent laboratory evaluations, in August 2018 magnesium 1.7, CMP, total protein was 5.2, albumin was 2.0, elevated AST 95, alkaline phosphate 153, vitamin B12 984, elevated lipase 747, normal TSH   REVIEW OF SYSTEMS: Full 14 system review of systems performed and notable only for memory loss, weakness, seizure  ALLERGIES: Allergies  Allergen Reactions  . Acetaminophen Itching    HOME MEDICATIONS: Current Outpatient Prescriptions  Medication Sig Dispense Refill  . Cholecalciferol (VITAMIN D3) 2000 UNITS TABS Take 2,000 Units by mouth daily.     . folic acid (FOLVITE) 1 MG tablet Take 1 tablet (1 mg total) by mouth daily. 30 tablet 0  . Multiple Vitamin (MULTIVITAMIN WITH MINERALS) TABS tablet Take 1 tablet by mouth daily. 30 tablet 0  . omeprazole (PRILOSEC) 40 MG capsule Take 40 mg by mouth daily.    Marland Kitchen POTASSIUM PO Take by mouth daily.    Marland Kitchen thiamine 100 MG tablet Take 1 tablet (100 mg total) by mouth daily. 30 tablet 0  . traMADol (ULTRAM) 50 MG tablet Take 1 tablet (50 mg total) by mouth every 6 (six) hours  as needed for moderate pain or severe pain. 20 tablet 0  . zonisamide (ZONEGRAN) 100 MG capsule By mouth, 100 mg daily for 2 weeks followed by 100 mg twice a day. 60 capsule 0   No current facility-administered medications for this visit.     PAST MEDICAL HISTORY: Past Medical History:  Diagnosis Date  . Anxiety   . Childhood asthma   . Depression   . Fibroids   . GERD (gastroesophageal reflux disease)   . Headache    "monthly"  (05/15/2017)  . High cholesterol   . Hypertension   . Pneumonia    "twice when I was young" (05/15/2017)  . PONV (postoperative nausea and vomiting)    in the past "used to get nauseous"  . Seizures ([place])    "I've had grand mal; usually hit the floor and wake up w/in a minute or so;last one was today, 05/15/2017"  . Subdural hematoma ([place]) 01/29/2013   Per MRI 01/2013; "had a seizure and busted my head open"    PAST SURGICAL HISTORY: Past Surgical History:  Procedure Laterality Date  . ABDOMINAL HYSTERECTOMY N/A 09/07/2015   Procedure: HYSTERECTOMY ABDOMINAL, CYSTOTOMY WITH BLADDER REPAIR;  Surgeon: Mora Bellman, MD;  Location: [facility];  Service: Gynecology;  Laterality: N/A;  Requested 09/07/15 @ 2:15p  . BILATERAL SALPINGECTOMY Bilateral 09/07/2015   Procedure: BILATERAL SALPINGECTOMY;  Surgeon: Mora Bellman, MD;  Location: [facility];  Service: Gynecology;  Laterality: Bilateral;  . CHOLECYSTECTOMY N/A 06/08/2017   Procedure: LAPAROSCOPIC CHOLECYSTECTOMY WITH INTRAOPERATIVE CHOLANGIOGRAM;  Surgeon: Donnie Mesa, MD;  Location: [facility];  Service: General;  Laterality: N/A;  . DIAGNOSTIC LAPAROSCOPY    . DILATION AND CURETTAGE OF UTERUS    . ESOPHAGOGASTRODUODENOSCOPY N/A 10/05/2016   Procedure: ESOPHAGOGASTRODUODENOSCOPY (EGD);  Surgeon: Wilford Corner, MD;  Location: [facility];  Service: Endoscopy;  Laterality: N/A;  . FRACTURE SURGERY    . ORIF ULNAR FRACTURE  09/03/2011   Procedure: OPEN REDUCTION INTERNAL FIXATION (ORIF) ULNAR FRACTURE;  Surgeon: Meredith Pel;  Location: [facility];  Service: Orthopedics;  Laterality: Right;  . ORIF ULNAR FRACTURE  12/14/2011   Procedure: OPEN REDUCTION INTERNAL FIXATION (ORIF) ULNAR FRACTURE;  Surgeon: Meredith Pel, MD;  Location: [facility];  Service: Orthopedics;  Laterality: Right;  Right ulnar shaft open reduction, internal fixation with right iliac crest bone graft  . TONSILLECTOMY      FAMILY HISTORY: Family History  Problem Relation  Age of Onset  . Cirrhosis Mother   . Diabetes Mellitus I Mother   . Esophageal cancer Father     SOCIAL HISTORY:  Social History   Social History  . Marital status: Married    Spouse name: N/A  . Number of children: 0  . Years of education: HS   Occupational History  . Unemployed    Social History Main Topics  . Smoking status: Current Every Day Smoker    Packs/day: 0.10    Years: 35.00    Types: Cigarettes  . Smokeless tobacco: Never Used  . Alcohol use No     Comment: 05/15/2017 - last drink  . Drug use: No  . Sexual activity: No   Other Topics Concern  . Not on file   Social History Narrative   Lives at home with boyfriend.   Right-handed.   1 cup caffeine per day.     PHYSICAL EXAM   Vitals:   07/18/17 0949  BP: (!) 138/91  Pulse: 86  Weight: 149 lb 12 oz (67.9  kg)  Height: 5\' 8"  (1.727 m)    Not recorded      Body mass index is 22.77 kg/m.  PHYSICAL EXAMNIATION:  Gen: NAD, conversant, well nourised, obese, well groomed                     Cardiovascular: Regular rate rhythm, no peripheral edema, warm, nontender. Eyes: Conjunctivae clear without exudates or hemorrhage Neck: Supple, no carotid bruits. Pulmonary: Clear to auscultation bilaterally   NEUROLOGICAL EXAM:  MENTAL STATUS: Speech:    Speech is normal; fluent and spontaneous with normal comprehension.  Cognition:     Orientation to time, place and person     Normal recent and remote memory     Normal Attention span and concentration     Normal Language, naming, repeating,spontaneous speech     Fund of knowledge   CRANIAL NERVES: CN II: Visual fields are full to confrontation. Fundoscopic exam is normal with sharp discs and no vascular changes. Pupils are round equal and briskly reactive to light. CN III, IV, VI: extraocular movement are normal. No ptosis. CN V: Facial sensation is intact to pinprick in all 3 divisions bilaterally. Corneal responses are intact.  CN VII: Face is  symmetric with normal eye closure and smile. CN VIII: Hearing is normal to rubbing fingers CN IX, X: Palate elevates symmetrically. Phonation is normal. CN XI: Head turning and shoulder shrug are intact CN XII: Tongue is midline with normal movements and no atrophy.  MOTOR: There is no pronator drift of out-stretched arms. Muscle bulk and tone are normal. Muscle strength is normal.  REFLEXES: Reflexes are 2+ and symmetric at the biceps, triceps, knees, and ankles. Plantar responses are flexor.  SENSORY: Intact to light touch, pinprick, positional sensation and vibratory sensation are intact in fingers and toes.  COORDINATION: Rapid alternating movements and fine finger movements are intact. There is no dysmetria on finger-to-nose and heel-knee-shin.    GAIT/STANCE: Posture is normal. Gait is steady with normal steps, base, arm swing, and turning. Heel and toe walking are normal. Tandem gait is normal.  Romberg is absent.   DIAGNOSTIC DATA (LABS, IMAGING, TESTING) - I reviewed patient records, labs, notes, testing and imaging myself where available.   ASSESSMENT AND PLAN  Cynthia Welch is a 56 y.o. female   Recurrent seizure, most recent was in July 2018,  Could not tolerate Keppra, now taking zonisamide 100 mg twice a day  Repeat EEG  History of heavy alcohol abuse,  Alcohol level upon admission in July 2018 was 260  History of subdural hematoma in 2014   Marcial Pacas, M.D. Ph.D.  [facility] [address], [place],  [address] Ph: [phone] Fax: [phone]  CC: Rogers Blocker, MD

## 2017-08-07 ENCOUNTER — Ambulatory Visit (INDEPENDENT_AMBULATORY_CARE_PROVIDER_SITE_OTHER): Payer: Self-pay | Admitting: Neurology

## 2017-08-07 DIAGNOSIS — G40909 Epilepsy, unspecified, not intractable, without status epilepticus: Secondary | ICD-10-CM

## 2017-08-07 DIAGNOSIS — S065X9A Traumatic subdural hemorrhage with loss of consciousness of unspecified duration, initial encounter: Secondary | ICD-10-CM

## 2017-08-07 DIAGNOSIS — Z87898 Personal history of other specified conditions: Secondary | ICD-10-CM

## 2017-08-07 DIAGNOSIS — S065XAA Traumatic subdural hemorrhage with loss of consciousness status unknown, initial encounter: Secondary | ICD-10-CM

## 2017-08-08 NOTE — Procedures (Signed)
   HISTORY: 56 years old female, with history of seizure  TECHNIQUE:  16 channel EEG was performed based on standard 10-16 international system. One channel was dedicated to EKG, which has demonstrates normal sinus rhythm of 78 beats per minutes.  Upon awakening, the posterior background activity was well-developed, in alpha range, 10 Hz reactive to eye opening and closure.  There was no evidence of epileptiform discharge.  Photic stimulation was performed, which induced a symmetric photic driving.  Hyperventilation was performed, there was no abnormality elicit.  No sleep was achieved.  CONCLUSION: This is a  normal awake EEG.  There is no electrodiagnostic evidence of epileptiform discharge.  Marcial Pacas, M.D. Ph.D.  [facility] [place], [place] [address] Phone: [phone] Fax:      [phone]

## 2017-08-09 ENCOUNTER — Telehealth: Payer: Self-pay | Admitting: *Deleted

## 2017-08-09 NOTE — Telephone Encounter (Signed)
Left patient a detailed message, with results, on her voicemail (ok per DPR).  Provided our number to call back with any questions.  

## 2017-08-09 NOTE — Telephone Encounter (Signed)
-----   Message from Marcial Pacas, MD sent at 08/09/2017  8:20 AM EDT ----- Please call patient for normal eeg

## 2017-08-24 ENCOUNTER — Other Ambulatory Visit: Payer: Self-pay | Admitting: Obstetrics and Gynecology

## 2017-08-24 DIAGNOSIS — Z1231 Encounter for screening mammogram for malignant neoplasm of breast: Secondary | ICD-10-CM

## 2017-09-20 ENCOUNTER — Encounter (HOSPITAL_COMMUNITY): Payer: Self-pay

## 2017-09-20 ENCOUNTER — Ambulatory Visit
Admission: RE | Admit: 2017-09-20 | Discharge: 2017-09-20 | Disposition: A | Discharge status: Home / Self Care | Payer: No Typology Code available for payment source | Source: Ambulatory Visit | Attending: Obstetrics and Gynecology | Admitting: Obstetrics and Gynecology

## 2017-09-20 ENCOUNTER — Ambulatory Visit (HOSPITAL_COMMUNITY)
Admission: RE | Admit: 2017-09-20 | Discharge: 2017-09-20 | Disposition: A | Discharge status: Home / Self Care | Payer: No Typology Code available for payment source | Source: Ambulatory Visit | Attending: Obstetrics and Gynecology | Admitting: Obstetrics and Gynecology

## 2017-09-20 VITALS — BP 116/82 | Temp 98.0°F | Ht 68.0 in | Wt 159.0 lb

## 2017-09-20 DIAGNOSIS — Z1231 Encounter for screening mammogram for malignant neoplasm of breast: Secondary | ICD-10-CM

## 2017-09-20 DIAGNOSIS — Z1239 Encounter for other screening for malignant neoplasm of breast: Secondary | ICD-10-CM

## 2017-09-20 NOTE — Patient Instructions (Signed)
Explained breast self awareness with [name]. Patient did not need a Pap smear today due to patient has a history of a hysterectomy for benign reasons. Let patient know that she doesn't need any further Pap smears due to her history of a hysterectomy for benign reasons. Referred patient to the [facility] for a screening mammogram. Appointment scheduled for Thursday, September 20, 2017 at 0910. Let patient know the [facility] will follow up with her within the next couple weeks with results of mammogram by letter or phone. Discussed smoking cessation with patient. Referred patient to the [facility] Quitline and gave resources for free smoking cessation classes at [facility]. Nasira Weiher verbalized understanding.  [place], Arvil Chaco, RN 8:25 AM

## 2017-09-20 NOTE — Progress Notes (Signed)
No complaints today.   Pap Smear: Pap smear not completed today. Last Pap smear was 08/05/2015 at [facility] and normal with positive HPV. Per patient has no history of an abnormal Pap smear. Patient has a history of a hysterectomy 09/07/2015 for fibroids. Patient no longer needs Pap smears due to her history of a hysterectomy for benign reasons per BCCCP and ACOG guidelines. Last Pap smear and hysterectomy reports are in Epic.  Physical exam: Breasts Breasts symmetrical. No skin abnormalities bilateral breasts. No nipple retraction bilateral breasts. No nipple discharge bilateral breasts. No lymphadenopathy. No lumps palpated bilateral breasts. No complaints of pain or tenderness on exam. Referred patient to the [facility] for a screening mammogram. Appointment scheduled for Thursday, September 20, 2017 at 0910.        Pelvic/Bimanual No Pap smear completed today since patient has a history of a hysterectomy for benign reasons. Pap smear not indicated per BCCCP guidelines.   Smoking History: Patient is a current smoker. Discussed smoking cessation with patient. Referred patient to the [facility] Quitline and gave resources for free smoking cessation classes at [facility].  Patient Navigation: Patient education provided. Access to services provided for patient through [facility] program.   Colorectal Cancer Screening: Per patient had a colonoscopy completed 10 years ago and did a stool card last year. No complaints today. FIT Test given to patient to complete and return to BCCCP.

## 2017-09-21 ENCOUNTER — Encounter (HOSPITAL_COMMUNITY): Payer: Self-pay | Admitting: *Deleted

## 2018-01-15 ENCOUNTER — Ambulatory Visit: Payer: Self-pay | Admitting: Nurse Practitioner

## 2018-01-29 ENCOUNTER — Other Ambulatory Visit: Payer: Self-pay

## 2018-01-31 LAB — FECAL OCCULT BLOOD, IMMUNOCHEMICAL: FECAL OCCULT BLD: NEGATIVE

## 2018-01-31 LAB — SPECIMEN STATUS REPORT

## 2018-02-04 NOTE — Progress Notes (Signed)
[facility]  PATIENT: Cynthia Welch DOB: 10-27-1960   REASON FOR VISIT: Follow-up for seizure disorder HISTORY FROM: Patient alone at visit    HISTORY OF PRESENT ILLNESS: 07/18/17 Cynthia Welch is a 57 years old female seen in refer by her primary care doctor Cynthia Welch for evaluation of seizure, initial evaluation was on July 18 2017. She is alone at today's clinical visit, she was brought in by her friend.  She has past medical history of heavy alcohol abuse, hypertension, macrocytic anemia, malnutrition of moderate degree, abnormal liver functional test,  I was able to review multiple hospital admissions since July 2018, she was admitted to the hospital from July 8 210 2018 after found unresponsive by her spouse at home, she was diagnosed with seizure versus binge alcohol use, alcohol level was 260 upon admission, critically low potassium less than 2, sodium was 120, echocardiogram and ultrasound of carotid artery showed no significant abnormality.  She was discharged home with Keppra 500 mg twice a day, but she complained of intractable nausea vomiting, was not able to keep any food and drink, readmission at the end of July 2018, CT abdomen showed colitis,cholelithiasis,  had laparoscopic cholecystectomy, had abnormal lipase level,  She is now taking zonisamide 100 mg twice a day, had no recurrent seizure,  She reported a history of seizure since 2013, no warning signs, sudden loss of consciousness, sometimes bumped her head, require stitches, MRI in 2014 showed evidence of subdural hematoma. She stated she has recurrent seizure spells, even while she was not drinking,  She used to work heavy labor job, because of her multiple recurrent seizures, she is in the process of applying for disability, living with her spouse, not driving, last seizure was in early July 2018.  I personally reviewed and compared MRIs on May 16 2017 to previous MRI in  2014, mild generalized atrophy no acute abnormalities, she had shallow bilateral subdural collections in the right side most consistent with right subdural hematoma 2014, which has resolved at MRI in July 2018.  Most recent laboratory evaluations, in August 2018 magnesium 1.7, CMP, total protein was 5.2, albumin was 2.0, elevated AST 95, alkaline phosphate 153, vitamin B12 984, elevated lipase 747, normal TSH UPDATE 4/16/2019[name], 57 year old female returns for follow-up with a history of seizure disorder, subdural hematoma in the past and history of heavy alcohol abuse however she tells me today she has stopped drinking.  She is currently on Zonegran 200 mg daily.  Most recent EEG done 08/07/2017 was normal.  Patient reports today that her husband says she still has episodes of zoning out for seconds at a time 2-3 times a week.  She is previously failed Keppra.  She does not drive.  She is independent in activities of daily living.  She returns for reevaluation REVIEW OF SYSTEMS: Full 14 system review of systems performed and notable only for those listed, all others are neg:  Constitutional: neg  Cardiovascular: neg Ear/Nose/Throat: neg  Skin: neg Eyes: neg Respiratory: neg Gastroitestinal: neg  Hematology/Lymphatic: neg  Endocrine: Intolerance to heat Musculoskeletal:neg Allergy/Immunology: Environmental allergies Neurological: History of seizure disorder Psychiatric: Depression Sleep : neg   ALLERGIES: Allergies  Allergen Reactions  . Acetaminophen Itching    HOME MEDICATIONS: Outpatient Medications Prior to Visit  Medication Sig Dispense Refill  . Cholecalciferol (VITAMIN D3) 2000 UNITS TABS Take 2,000 Units by mouth daily.     . folic acid (FOLVITE) 1 MG tablet Take 1 tablet (1 mg total)  by mouth daily. 30 tablet 0  . metoprolol succinate (TOPROL-XL) 25 MG 24 hr tablet Take 25 mg by mouth daily.  2  . Multiple Vitamin (MULTIVITAMIN WITH MINERALS) TABS tablet Take 1  tablet by mouth daily. 30 tablet 0  . POTASSIUM PO Take by mouth daily.    Marland Kitchen thiamine 100 MG tablet Take 1 tablet (100 mg total) by mouth daily. 30 tablet 0  . zonisamide (ZONEGRAN) 100 MG capsule By mouth, 100 mg daily for 2 weeks followed by 100 mg twice a day. 60 capsule 11  . omeprazole (PRILOSEC) 40 MG capsule Take 40 mg by mouth daily.    . traMADol (ULTRAM) 50 MG tablet Take 1 tablet (50 mg total) by mouth every 6 (six) hours as needed for moderate pain or severe pain. 20 tablet 0   No facility-administered medications prior to visit.     PAST MEDICAL HISTORY: Past Medical History:  Diagnosis Date  . Anxiety   . Childhood asthma   . Depression   . Fibroids   . GERD (gastroesophageal reflux disease)   . Headache    "monthly" (05/15/2017)  . High cholesterol   . Hypertension   . Pneumonia    "twice when I was young" (05/15/2017)  . PONV (postoperative nausea and vomiting)    in the past "used to get nauseous"  . Seizures ([place]) last sz 04/2017   "I've had grand mal; usually hit the floor and wake up w/in a minute or so;last one was today, 05/15/2017"  . Subdural hematoma ([place]) 01/29/2013   Per MRI 01/2013; "had a seizure and busted my head open"    PAST SURGICAL HISTORY: Past Surgical History:  Procedure Laterality Date  . ABDOMINAL HYSTERECTOMY N/A 09/07/2015   Procedure: HYSTERECTOMY ABDOMINAL, CYSTOTOMY WITH BLADDER REPAIR;  Surgeon: Mora Bellman, MD;  Location: [facility];  Service: Gynecology;  Laterality: N/A;  Requested 09/07/15 @ 2:15p  . BILATERAL SALPINGECTOMY Bilateral 09/07/2015   Procedure: BILATERAL SALPINGECTOMY;  Surgeon: Mora Bellman, MD;  Location: [facility];  Service: Gynecology;  Laterality: Bilateral;  . CHOLECYSTECTOMY N/A 06/08/2017   Procedure: LAPAROSCOPIC CHOLECYSTECTOMY WITH INTRAOPERATIVE CHOLANGIOGRAM;  Surgeon: Donnie Mesa, MD;  Location: [facility];  Service: General;  Laterality: N/A;  . DIAGNOSTIC LAPAROSCOPY    . DILATION AND CURETTAGE OF UTERUS      . ESOPHAGOGASTRODUODENOSCOPY N/A 10/05/2016   Procedure: ESOPHAGOGASTRODUODENOSCOPY (EGD);  Surgeon: Wilford Corner, MD;  Location: [facility];  Service: Endoscopy;  Laterality: N/A;  . FRACTURE SURGERY    . ORIF ULNAR FRACTURE  09/03/2011   Procedure: OPEN REDUCTION INTERNAL FIXATION (ORIF) ULNAR FRACTURE;  Surgeon: Meredith Pel;  Location: [facility];  Service: Orthopedics;  Laterality: Right;  . ORIF ULNAR FRACTURE  12/14/2011   Procedure: OPEN REDUCTION INTERNAL FIXATION (ORIF) ULNAR FRACTURE;  Surgeon: Meredith Pel, MD;  Location: [facility];  Service: Orthopedics;  Laterality: Right;  Right ulnar shaft open reduction, internal fixation with right iliac crest bone graft  . TONSILLECTOMY      FAMILY HISTORY: Family History  Problem Relation Age of Onset  . Cirrhosis Mother   . Diabetes Mellitus I Mother   . Esophageal cancer Father   . Cancer Sister   . Heart attack Brother     SOCIAL HISTORY: Social History   Socioeconomic History  . Marital status: Married    Spouse name: Not on file  . Number of children: 0  . Years of education: HS  . Highest education level: Not on file  Occupational History  . Occupation: Unemployed  Social Needs  . Financial resource strain: Not on file  . Food insecurity:    Worry: Not on file    Inability: Not on file  . Transportation needs:    Medical: Not on file    Non-medical: Not on file  Tobacco Use  . Smoking status: Current Every Day Smoker    Packs/day: 0.10    Years: 35.00    Pack years: 3.50    Types: Cigarettes  . Smokeless tobacco: Never Used  Substance and Sexual Activity  . Alcohol use: No    Comment: 05/15/2017 - last drink  . Drug use: No  . Sexual activity: Never    Birth control/protection: None  Lifestyle  . Physical activity:    Days per week: Patient refused    Minutes per session: Patient refused  . Stress: Not on file  Relationships  . Social connections:    Talks on phone: Patient refused    Gets  together: Patient refused    Attends religious service: Patient refused    Active member of club or organization: Patient refused    Attends meetings of clubs or organizations: Patient refused    Relationship status: Patient refused  . Intimate partner violence:    Fear of current or ex partner: Patient refused    Emotionally abused: Patient refused    Physically abused: Patient refused    Forced sexual activity: Patient refused  Other Topics Concern  . Not on file  Social History Narrative   Lives at home with boyfriend.   Right-handed.   1 cup caffeine per day.     PHYSICAL EXAM  Vitals:   02/05/18 0751  BP: 124/76  Pulse: 77  Weight: 172 lb (78 kg)  Height: 5\' 8"  (1.727 m)   Body mass index is 26.15 kg/m.  Generalized: Well developed, in no acute distress  Head: normocephalic and atraumatic,. Oropharynx benign  Neck: Supple,  Musculoskeletal: No deformity   Neurological examination   Mentation: Alert oriented to time, place, history taking. Attention span and concentration appropriate. Recent and remote memory intact.  Follows all commands speech and language fluent.   Cranial nerve II-XII: Fundoscopic exam reveals sharp disc margins.Pupils were equal round reactive to light extraocular movements were full, visual field were full on confrontational test. Facial sensation and strength were normal. hearing was intact to finger rubbing bilaterally. Uvula tongue midline. head turning and shoulder shrug were normal and symmetric.Tongue protrusion into cheek strength was normal. Motor: normal bulk and tone, full strength in the BUE, BLE, Sensory: normal and symmetric to light touch,  Coordination: finger-nose-finger, heel-to-shin bilaterally, no dysmetria Reflexes: Brachioradialis 2/2, biceps 2/2, triceps 2/2, patellar 2/2, Achilles 2/2, plantar responses were flexor bilaterally. Gait and Station: Rising up from seated position without assistance, normal stance,  moderate  stride, good arm swing, smooth turning, able to perform tiptoe, and heel walking without difficulty. Tandem gait is steady  DIAGNOSTIC DATA (LABS, IMAGING, TESTING) - I reviewed patient records, labs, notes, testing and imaging myself where available.  Lab Results  Component Value Date   WBC 6.8 06/09/2017   HGB 9.2 (L) 06/09/2017   HCT 27.9 (L) 06/09/2017   MCV 103.3 (H) 06/09/2017   PLT 182 06/09/2017      Component Value Date/Time   NA 136 06/09/2017 0451   K 3.5 06/09/2017 0451   CL 110 06/09/2017 0451   CO2 20 (L) 06/09/2017 0451   GLUCOSE 109 (H) 06/09/2017 0451  BUN <5 (L) 06/09/2017 0451   CREATININE 0.53 06/09/2017 0451   CALCIUM 8.3 (L) 06/09/2017 0451   PROT 5.2 (L) 06/09/2017 0451   ALBUMIN 2.0 (L) 06/09/2017 0451   AST 95 (H) 06/09/2017 0451   ALT 35 06/09/2017 0451   ALKPHOS 153 (H) 06/09/2017 0451   BILITOT 0.6 06/09/2017 0451   GFRNONAA >60 06/09/2017 0451   GFRAA >60 06/09/2017 0451   Lab Results  Component Value Date   CHOL 262 (H) 08/26/2010   HDL 118 08/26/2010   LDLCALC 132 (H) 08/26/2010   TRIG 58 08/26/2010   CHOLHDL 2.2 Ratio 08/26/2010    Lab Results  Component Value Date   VITAMINB12 984 (H) 06/06/2017   Lab Results  Component Value Date   TSH 2.352 04/30/2017      ASSESSMENT AND PLAN Cynthia Welch is a 57 y.o. female  here to follow-up for seizure disorder, history of heavy alcohol abuse and history of subdural hematoma in 2014.  Patient could not tolerate Keppra.  She is currently taking Zonegran 200 mg daily.  She continues to have brief zoning out episodes according to her husband 2-3 times a week that last about 5-10 seconds.   PLAN: Increase Zonegran to 3 caps daily will refill Most recent EEG normal Keep a record of starring spells Please remember, common seizure triggers are: Sleep deprivation, dehydration, overheating, stress, hypoglycemia or skipping meals, certain medications or excessive alcohol use, especially  stopping alcohol abruptly if you have had heavy alcohol use before (aka alcohol withdrawal seizure). If you have a prolonged seizure over 2-5 minutes or back to back seizures, call or have someone call 911 or take you to the nearest emergency room. You cannot drive a car or operate any other machinery or vehicle within 6 months of a seizure. Please do not swim alone or take a tub bath for safety. Do not cook with large quantities of boiling water or oil for safety. Take your medicine for seizure prevention regularly and do not skip doses or stop medication abruptly and tone are told to do so by your healthcare provider. Try to get a refill on your antiepileptic medication ahead of time, so you are not at risk of running out. If you run out of the seizure medication and do not have a refill at hand she may run into medication withdrawal seizures. Avoid taking Wellbutrin, narcotic pain medications and tramadol, as they can lower seizure threshold.   F/U in 6 months I spent 25 minutes in total face to face time with the patient more than 50% of which was spent counseling and coordination of care, reviewing test results reviewing medications and discussing and reviewing the diagnosis of seizure disorder and further treatment options.  Patient also asked to keep a record of her staring spells, Dennie Bible, [facility], [facility], APRN  [facility] [address] [place], [place] [address] [phone]

## 2018-02-05 ENCOUNTER — Encounter: Payer: Self-pay | Admitting: Nurse Practitioner

## 2018-02-05 ENCOUNTER — Ambulatory Visit: Payer: Self-pay | Admitting: Nurse Practitioner

## 2018-02-05 VITALS — BP 124/76 | HR 77 | Ht 68.0 in | Wt 172.0 lb

## 2018-02-05 DIAGNOSIS — G40909 Epilepsy, unspecified, not intractable, without status epilepticus: Secondary | ICD-10-CM

## 2018-02-05 MED ORDER — ZONISAMIDE 100 MG PO CAPS: ORAL_CAPSULE | ORAL | 6 refills | Status: DC

## 2018-02-05 NOTE — Patient Instructions (Addendum)
Increase Zonegran to 3 caps daily will refill Most recent EEG normal Keep a record of starring spells  F/U in 6 months Please remember, common seizure triggers are: Sleep deprivation, dehydration, overheating, stress, hypoglycemia or skipping meals, certain medications or excessive alcohol use, especially stopping alcohol abruptly if you have had heavy alcohol use before (aka alcohol withdrawal seizure). If you have a prolonged seizure over 2-5 minutes or back to back seizures, call or have someone call 911 or take you to the nearest emergency room. You cannot drive a car or operate any other machinery or vehicle within 6 months of a seizure. Please do not swim alone  Do not cook with large quantities of boiling water or oil for safety. Take your medicine for seizure prevention regularly and do not skip doses or stop medication abruptly and tone are told to do so by your healthcare provider. Try to get a refill on your antiepileptic medication ahead of time, so you are not at risk of running out. If you run out of the seizure medication and do not have a refill at hand she may run into medication withdrawal seizures. Avoid taking Wellbutrin, narcotic pain medications and tramadol, as they can lower seizure threshold.

## 2018-02-06 NOTE — Progress Notes (Signed)
I have reviewed and agreed above plan. 

## 2018-02-11 ENCOUNTER — Encounter (HOSPITAL_COMMUNITY): Payer: Self-pay

## 2018-08-05 NOTE — Progress Notes (Signed)
[facility]  PATIENT: Cynthia Welch DOB: November 12, 1960   REASON FOR VISIT: Follow-up for seizure disorder HISTORY FROM: Patient alone at visit    HISTORY OF PRESENT ILLNESS: 07/18/17 Cynthia Welch is a 57 years old female seen in refer by her primary care doctor Rogers Blocker for evaluation of seizure, initial evaluation was on July 18 2017. She is alone at today's clinical visit, she was brought in by her friend.  She has past medical history of heavy alcohol abuse, hypertension, macrocytic anemia, malnutrition of moderate degree, abnormal liver functional test,  I was able to review multiple hospital admissions since July 2018, she was admitted to the hospital from July 8 210 2018 after found unresponsive by her spouse at home, she was diagnosed with seizure versus binge alcohol use, alcohol level was 260 upon admission, critically low potassium less than 2, sodium was 120, echocardiogram and ultrasound of carotid artery showed no significant abnormality.  She was discharged home with Keppra 500 mg twice a day, but she complained of intractable nausea vomiting, was not able to keep any food and drink, readmission at the end of July 2018, CT abdomen showed colitis,cholelithiasis,  had laparoscopic cholecystectomy, had abnormal lipase level,  She is now taking zonisamide 100 mg twice a day, had no recurrent seizure,  She reported a history of seizure since 2013, no warning signs, sudden loss of consciousness, sometimes bumped her head, require stitches, MRI in 2014 showed evidence of subdural hematoma. She stated she has recurrent seizure spells, even while she was not drinking,  She used to work heavy labor job, because of her multiple recurrent seizures, she is in the process of applying for disability, living with her spouse, not driving, last seizure was in early July 2018.  I personally reviewed and compared MRIs on May 16 2017 to previous MRI in  2014, mild generalized atrophy no acute abnormalities, she had shallow bilateral subdural collections in the right side most consistent with right subdural hematoma 2014, which has resolved at MRI in July 2018.  Most recent laboratory evaluations, in August 2018 magnesium 1.7, CMP, total protein was 5.2, albumin was 2.0, elevated AST 95, alkaline phosphate 153, vitamin B12 984, elevated lipase 747, normal TSH UPDATE 4/16/2019[name], 57 year old female returns for follow-up with a history of seizure disorder, subdural hematoma in the past and history of heavy alcohol abuse however she tells me today she has stopped drinking.  She is currently on Zonegran 200 mg daily.  Most recent EEG done 08/07/2017 was normal.  Patient reports today that her husband says she still has episodes of zoning out for seconds at a time 2-3 times a week.  She is previously failed Keppra.  She does not drive.  She is independent in activities of daily living.  She returns for reevaluation  UPDATE 10/16/2019[name], 57 year old female returns for follow-up with history of seizure disorder, subdural hematoma in the past and history of heavy alcohol abuse however she has stopped drinking.  She is currently on Zonegran 300 mg daily.  She denies any side effects to the medication she has had 2 seizures since her last visit in the setting of stress with her brother passing away suddenly from a heart attack.  She does not drive but uses public transportation.  She remains independent in activities of daily living.  She has failed Keppra in the past.  She returns for reevaluation  REVIEW OF SYSTEMS: Full 14 system review of systems performed and notable  only for those listed, all others are neg:  Constitutional: neg  Cardiovascular: neg Ear/Nose/Throat: neg  Skin: neg Eyes: neg Respiratory: neg Gastroitestinal: neg  Hematology/Lymphatic: neg  Endocrine: Intolerance to  heat Musculoskeletal:neg Allergy/Immunology: Environmental allergies Neurological: seizure disorder Psychiatric: Depression Sleep : neg   ALLERGIES: Allergies  Allergen Reactions  . Acetaminophen Itching    HOME MEDICATIONS: Outpatient Medications Prior to Visit  Medication Sig Dispense Refill  . Cholecalciferol (VITAMIN D3) 2000 UNITS TABS Take 2,000 Units by mouth daily.     . folic acid (FOLVITE) 1 MG tablet Take 1 tablet (1 mg total) by mouth daily. 30 tablet 0  . metoprolol succinate (TOPROL-XL) 25 MG 24 hr tablet Take 25 mg by mouth daily.  2  . Multiple Vitamin (MULTIVITAMIN WITH MINERALS) TABS tablet Take 1 tablet by mouth daily. 30 tablet 0  . POTASSIUM PO Take by mouth daily.    Marland Kitchen thiamine 100 MG tablet Take 1 tablet (100 mg total) by mouth daily. 30 tablet 0  . zonisamide (ZONEGRAN) 100 MG capsule 3 caps daily 90 capsule 6   No facility-administered medications prior to visit.     PAST MEDICAL HISTORY: Past Medical History:  Diagnosis Date  . Anxiety   . Childhood asthma   . Depression   . Fibroids   . GERD (gastroesophageal reflux disease)   . Headache    "monthly" (05/15/2017)  . High cholesterol   . Hypertension   . Pneumonia    "twice when I was young" (05/15/2017)  . PONV (postoperative nausea and vomiting)    in the past "used to get nauseous"  . Seizures ([place]) last sz 04/2017   "I've had grand mal; usually hit the floor and wake up w/in a minute or so;last one was today, 05/15/2017"  . Subdural hematoma ([place]) 01/29/2013   Per MRI 01/2013; "had a seizure and busted my head open"    PAST SURGICAL HISTORY: Past Surgical History:  Procedure Laterality Date  . ABDOMINAL HYSTERECTOMY N/A 09/07/2015   Procedure: HYSTERECTOMY ABDOMINAL, CYSTOTOMY WITH BLADDER REPAIR;  Surgeon: Mora Bellman, MD;  Location: [facility];  Service: Gynecology;  Laterality: N/A;  Requested 09/07/15 @ 2:15p  . BILATERAL SALPINGECTOMY Bilateral 09/07/2015   Procedure: BILATERAL  SALPINGECTOMY;  Surgeon: Mora Bellman, MD;  Location: [facility];  Service: Gynecology;  Laterality: Bilateral;  . CHOLECYSTECTOMY N/A 06/08/2017   Procedure: LAPAROSCOPIC CHOLECYSTECTOMY WITH INTRAOPERATIVE CHOLANGIOGRAM;  Surgeon: Donnie Mesa, MD;  Location: [facility];  Service: General;  Laterality: N/A;  . DIAGNOSTIC LAPAROSCOPY    . DILATION AND CURETTAGE OF UTERUS    . ESOPHAGOGASTRODUODENOSCOPY N/A 10/05/2016   Procedure: ESOPHAGOGASTRODUODENOSCOPY (EGD);  Surgeon: Wilford Corner, MD;  Location: [facility];  Service: Endoscopy;  Laterality: N/A;  . FRACTURE SURGERY    . ORIF ULNAR FRACTURE  09/03/2011   Procedure: OPEN REDUCTION INTERNAL FIXATION (ORIF) ULNAR FRACTURE;  Surgeon: Meredith Pel;  Location: [place];  Service: Orthopedics;  Laterality: Right;  . ORIF ULNAR FRACTURE  12/14/2011   Procedure: OPEN REDUCTION INTERNAL FIXATION (ORIF) ULNAR FRACTURE;  Surgeon: Meredith Pel, MD;  Location: [place];  Service: Orthopedics;  Laterality: Right;  Right ulnar shaft open reduction, internal fixation with right iliac crest bone graft  . TONSILLECTOMY      FAMILY HISTORY: Family History  Problem Relation Age of Onset  . Cirrhosis Mother   . Diabetes Mellitus I Mother   . Esophageal cancer Father   . Cancer Sister   . Heart attack Brother  SOCIAL HISTORY: Social History   Socioeconomic History  . Marital status: Married    Spouse name: Not on file  . Number of children: 0  . Years of education: HS  . Highest education level: Not on file  Occupational History  . Occupation: Unemployed  Social Needs  . Financial resource strain: Not on file  . Food insecurity:    Worry: Not on file    Inability: Not on file  . Transportation needs:    Medical: Not on file    Non-medical: Not on file  Tobacco Use  . Smoking status: Current Every Day Smoker    Packs/day: 0.10    Years: 35.00    Pack years: 3.50    Types: Cigarettes  . Smokeless tobacco: Never Used  Substance  and Sexual Activity  . Alcohol use: No    Comment: 05/15/2017 - last drink  . Drug use: No  . Sexual activity: Never    Birth control/protection: None  Lifestyle  . Physical activity:    Days per week: Patient refused    Minutes per session: Patient refused  . Stress: Not on file  Relationships  . Social connections:    Talks on phone: Patient refused    Gets together: Patient refused    Attends religious service: Patient refused    Active member of club or organization: Patient refused    Attends meetings of clubs or organizations: Patient refused    Relationship status: Patient refused  . Intimate partner violence:    Fear of current or ex partner: Patient refused    Emotionally abused: Patient refused    Physically abused: Patient refused    Forced sexual activity: Patient refused  Other Topics Concern  . Not on file  Social History Narrative   Lives at home with boyfriend.   Right-handed.   1 cup caffeine per day.     PHYSICAL EXAM  Vitals:   08/07/18 0730  BP: 129/90  Pulse: 70  Weight: 179 lb 3.2 oz (81.3 kg)  Height: 5\' 8"  (1.727 m)   Body mass index is 27.25 kg/m.  Generalized: Well developed, in no acute distress  Head: normocephalic and atraumatic,. Oropharynx benign  Neck: Supple,  Musculoskeletal: No deformity   Neurological examination   Mentation: Alert oriented to time, place, history taking. Attention span and concentration appropriate. Recent and remote memory intact.  Follows all commands speech and language fluent.   Cranial nerve II-XII: Pupils were equal round reactive to light extraocular movements were full, visual field were full on confrontational test. Facial sensation and strength were normal. hearing was intact to finger rubbing bilaterally. Uvula tongue midline. head turning and shoulder shrug were normal and symmetric.Tongue protrusion into cheek strength was normal. Motor: normal bulk and tone, full strength in the BUE,  BLE, Sensory: normal and symmetric to light touch,  Coordination: finger-nose-finger, heel-to-shin bilaterally, no dysmetria Reflexes: Brachioradialis 2/2, biceps 2/2, triceps 2/2, patellar 2/2, Achilles 2/2, plantar responses were flexor bilaterally. Gait and Station: Rising up from seated position without assistance, normal stance,  moderate stride, good arm swing, smooth turning, able to perform tiptoe, and heel walking without difficulty. Tandem gait is steady.  No assistive device  DIAGNOSTIC DATA (LABS, IMAGING, TESTING) - I reviewed patient records, labs, notes, testing and imaging myself where available.  Lab Results  Component Value Date   WBC 6.8 06/09/2017   HGB 9.2 (L) 06/09/2017   HCT 27.9 (L) 06/09/2017   MCV 103.3 (H) 06/09/2017  PLT 182 06/09/2017      Component Value Date/Time   NA 136 06/09/2017 0451   K 3.5 06/09/2017 0451   CL 110 06/09/2017 0451   CO2 20 (L) 06/09/2017 0451   GLUCOSE 109 (H) 06/09/2017 0451   BUN <5 (L) 06/09/2017 0451   CREATININE 0.53 06/09/2017 0451   CALCIUM 8.3 (L) 06/09/2017 0451   PROT 5.2 (L) 06/09/2017 0451   ALBUMIN 2.0 (L) 06/09/2017 0451   AST 95 (H) 06/09/2017 0451   ALT 35 06/09/2017 0451   ALKPHOS 153 (H) 06/09/2017 0451   BILITOT 0.6 06/09/2017 0451   GFRNONAA >60 06/09/2017 0451   GFRAA >60 06/09/2017 0451   Lab Results  Component Value Date   CHOL 262 (H) 08/26/2010   HDL 118 08/26/2010   LDLCALC 132 (H) 08/26/2010   TRIG 58 08/26/2010   CHOLHDL 2.2 Ratio 08/26/2010    Lab Results  Component Value Date   VITAMINB12 984 (H) 06/06/2017   Lab Results  Component Value Date   TSH 2.352 04/30/2017      ASSESSMENT AND PLAN Deaundra Dupriest is a 57 y.o. female  here to follow-up for seizure disorder, history of heavy alcohol abuse and history of subdural hematoma in 2014.  Patient could not tolerate Keppra.  She is currently taking Zonegran 300 mg daily.  She continues to have brief zoning out episodes 2 since  last seen in the setting of her brother suddenly dying of heart attack   PLAN: Continue  Zonegran  3 caps daily  Will get level today to check compliance Lamictal 25 mg for 2 weeks then increase to 25mg  twice daily Most recent EEG normal Keep a record of starring spells Please remember, common seizure triggers are: Sleep deprivation, dehydration, overheating, stress, hypoglycemia or skipping meals, certain medications or excessive alcohol use, especially stopping alcohol abruptly if you have had heavy alcohol use before (aka alcohol withdrawal seizure). If you have a prolonged seizure over 2-5 minutes or back to back seizures, call or have someone call 911 or take you to the nearest emergency room. You cannot drive a car or operate any other machinery or vehicle within 6 months of a seizure. Please do not swim alone or take a tub bath for safety.  Take your medicine for seizure prevention regularly and do not skip doses or stop medication abruptly and tone are told to do so by your healthcare provider.  Avoid taking Wellbutrin, narcotic pain medications and tramadol, as they can lower seizure threshold.   F/U in 4 months I spent 25 minutes in total face to face time with the patient more than 50% of which was spent counseling and coordination of care, reviewing test results reviewing medications and discussing and reviewing the diagnosis of seizure disorder and further treatment options.  Patient also asked to keep a record of her staring spells, Dennie Bible, [facility], [facility], APRN  [facility] [address], [place], [place] [address] [phone]

## 2018-08-07 ENCOUNTER — Ambulatory Visit: Payer: Self-pay | Admitting: Nurse Practitioner

## 2018-08-07 ENCOUNTER — Encounter: Payer: Self-pay | Admitting: Nurse Practitioner

## 2018-08-07 VITALS — BP 129/90 | HR 70 | Ht 68.0 in | Wt 179.2 lb

## 2018-08-07 DIAGNOSIS — G40909 Epilepsy, unspecified, not intractable, without status epilepticus: Secondary | ICD-10-CM

## 2018-08-07 DIAGNOSIS — Z5181 Encounter for therapeutic drug level monitoring: Secondary | ICD-10-CM | POA: Insufficient documentation

## 2018-08-07 MED ORDER — LAMOTRIGINE 25 MG PO TABS: 25.0000 mg | ORAL_TABLET | Freq: Every day | ORAL | 4 refills | Status: DC

## 2018-08-07 MED ORDER — ZONISAMIDE 100 MG PO CAPS: ORAL_CAPSULE | ORAL | 6 refills | Status: DC

## 2018-08-07 NOTE — Patient Instructions (Addendum)
Continue  Zonegran  3 caps daily  Will get level today  Lamictal 25 mg for 2 weeks then increase to 25mg  twice daily Most recent EEG normal Keep a record of starring spells Please remember, common seizure triggers are: Sleep deprivation, dehydration, overheating, stress, hypoglycemia or skipping meals, certain medications or excessive alcohol use, especially stopping alcohol abruptly if you have had heavy alcohol use before (aka alcohol withdrawal seizure). If you have a prolonged seizure over 2-5 minutes or back to back seizures, call or have someone call 911 or take you to the nearest emergency room. You cannot drive a car or operate any other machinery or vehicle within 6 months of a seizure. Please do not swim alone or take a tub bath for safety. Do not cook with large quantities of boiling water or oil for safety. Take your medicine for seizure prevention regularly and do not skip doses or stop medication abruptly and tone are told to do so by your healthcare provider.  Avoid taking Wellbutrin, narcotic pain medications and tramadol, as they can lower seizure threshold.   F/U in 4 months

## 2018-08-08 ENCOUNTER — Telehealth: Payer: Self-pay | Admitting: Nurse Practitioner

## 2018-08-08 LAB — ZONISAMIDE LEVEL: Zonisamide: 6.9 ug/mL — ABNORMAL LOW (ref 10.0–40.0)

## 2018-08-08 MED ORDER — ZONISAMIDE 100 MG PO CAPS: 400.0000 mg | ORAL_CAPSULE | Freq: Every day | ORAL | 6 refills | Status: DC

## 2018-08-08 NOTE — Telephone Encounter (Signed)
Zonegran level low at 6.9 please increase to 4 capsules daily.  Please call the patient

## 2018-08-08 NOTE — Telephone Encounter (Signed)
Spoke to pt and gave her the lab results of her trough zonegran level of 6.9 which was low.  CM/NP recommended increasing to 4 caps daily.  Prescription already sent to [facility] [address] to account for change.  She verbalized understanding.

## 2018-08-13 NOTE — Progress Notes (Signed)
I have reviewed and agreed above plan. 

## 2018-08-16 ENCOUNTER — Other Ambulatory Visit: Payer: Self-pay | Admitting: Nurse Practitioner

## 2018-09-23 ENCOUNTER — Telehealth (HOSPITAL_COMMUNITY): Payer: Self-pay | Admitting: Obstetrics and Gynecology

## 2018-09-23 NOTE — Telephone Encounter (Signed)
Called patient and left message regarding scheduling screening mammogram.  °

## 2018-11-06 ENCOUNTER — Other Ambulatory Visit (HOSPITAL_COMMUNITY): Payer: Self-pay | Admitting: *Deleted

## 2018-11-06 DIAGNOSIS — Z1231 Encounter for screening mammogram for malignant neoplasm of breast: Secondary | ICD-10-CM

## 2018-12-06 NOTE — Progress Notes (Deleted)
[facility]  PATIENT: Cynthia Welch DOB: November 12, 1960   REASON FOR VISIT: Follow-up for seizure disorder HISTORY FROM: Patient alone at visit    HISTORY OF PRESENT ILLNESS: 07/18/17 Cynthia Welch is a 58 years old female seen in refer by her primary care doctor Rogers Blocker for evaluation of seizure, initial evaluation was on July 18 2017. She is alone at today's clinical visit, she was brought in by her friend.  She has past medical history of heavy alcohol abuse, hypertension, macrocytic anemia, malnutrition of moderate degree, abnormal liver functional test,  I was able to review multiple hospital admissions since July 2018, she was admitted to the hospital from July 8 210 2018 after found unresponsive by her spouse at home, she was diagnosed with seizure versus binge alcohol use, alcohol level was 260 upon admission, critically low potassium less than 2, sodium was 120, echocardiogram and ultrasound of carotid artery showed no significant abnormality.  She was discharged home with Keppra 500 mg twice a day, but she complained of intractable nausea vomiting, was not able to keep any food and drink, readmission at the end of July 2018, CT abdomen showed colitis,cholelithiasis,  had laparoscopic cholecystectomy, had abnormal lipase level,  She is now taking zonisamide 100 mg twice a day, had no recurrent seizure,  She reported a history of seizure since 2013, no warning signs, sudden loss of consciousness, sometimes bumped her head, require stitches, MRI in 2014 showed evidence of subdural hematoma. She stated she has recurrent seizure spells, even while she was not drinking,  She used to work heavy labor job, because of her multiple recurrent seizures, she is in the process of applying for disability, living with her spouse, not driving, last seizure was in early July 2018.  I personally reviewed and compared MRIs on May 16 2017 to previous MRI in  2014, mild generalized atrophy no acute abnormalities, she had shallow bilateral subdural collections in the right side most consistent with right subdural hematoma 2014, which has resolved at MRI in July 2018.  Most recent laboratory evaluations, in August 2018 magnesium 1.7, CMP, total protein was 5.2, albumin was 2.0, elevated AST 95, alkaline phosphate 153, vitamin B12 984, elevated lipase 747, normal TSH UPDATE 4/16/2019[name], 58 year old female returns for follow-up with a history of seizure disorder, subdural hematoma in the past and history of heavy alcohol abuse however she tells me today she has stopped drinking.  She is currently on Zonegran 200 mg daily.  Most recent EEG done 08/07/2017 was normal.  Patient reports today that her husband says she still has episodes of zoning out for seconds at a time 2-3 times a week.  She is previously failed Keppra.  She does not drive.  She is independent in activities of daily living.  She returns for reevaluation  UPDATE 10/16/2019[name], 58 year old female returns for follow-up with history of seizure disorder, subdural hematoma in the past and history of heavy alcohol abuse however she has stopped drinking.  She is currently on Zonegran 300 mg daily.  She denies any side effects to the medication she has had 2 seizures since her last visit in the setting of stress with her brother passing away suddenly from a heart attack.  She does not drive but uses public transportation.  She remains independent in activities of daily living.  She has failed Keppra in the past.  She returns for reevaluation  REVIEW OF SYSTEMS: Full 14 system review of systems performed and notable  only for those listed, all others are neg:  Constitutional: neg  Cardiovascular: neg Ear/Nose/Throat: neg  Skin: neg Eyes: neg Respiratory: neg Gastroitestinal: neg  Hematology/Lymphatic: neg  Endocrine: Intolerance to  heat Musculoskeletal:neg Allergy/Immunology: Environmental allergies Neurological: seizure disorder Psychiatric: Depression Sleep : neg   ALLERGIES: Allergies  Allergen Reactions  . Acetaminophen Itching    HOME MEDICATIONS: Outpatient Medications Prior to Visit  Medication Sig Dispense Refill  . Cholecalciferol (VITAMIN D3) 2000 UNITS TABS Take 2,000 Units by mouth daily.     . folic acid (FOLVITE) 1 MG tablet Take 1 tablet (1 mg total) by mouth daily. 30 tablet 0  . lamoTRIgine (LAMICTAL) 25 MG tablet Take 1 tablet (25 mg total) by mouth daily. 1 po daily for 2 weeks then increase to 1 twice daily 60 tablet 4  . metoprolol succinate (TOPROL-XL) 25 MG 24 hr tablet Take 25 mg by mouth daily.  2  . Multiple Vitamin (MULTIVITAMIN WITH MINERALS) TABS tablet Take 1 tablet by mouth daily. 30 tablet 0  . POTASSIUM PO Take by mouth daily.    Marland Kitchen thiamine 100 MG tablet Take 1 tablet (100 mg total) by mouth daily. 30 tablet 0  . zonisamide (ZONEGRAN) 100 MG capsule Take 4 capsules (400 mg total) by mouth daily. 4 caps daily 120 capsule 6   No facility-administered medications prior to visit.     PAST MEDICAL HISTORY: Past Medical History:  Diagnosis Date  . Anxiety   . Childhood asthma   . Depression   . Fibroids   . GERD (gastroesophageal reflux disease)   . Headache    "monthly" (05/15/2017)  . High cholesterol   . Hypertension   . Pneumonia    "twice when I was young" (05/15/2017)  . PONV (postoperative nausea and vomiting)    in the past "used to get nauseous"  . Seizures ([place]) last sz 04/2017   "I've had grand mal; usually hit the floor and wake up w/in a minute or so;last one was today, 05/15/2017"  . Subdural hematoma ([place]) 01/29/2013   Per MRI 01/2013; "had a seizure and busted my head open"    PAST SURGICAL HISTORY: Past Surgical History:  Procedure Laterality Date  . ABDOMINAL HYSTERECTOMY N/A 09/07/2015   Procedure: HYSTERECTOMY ABDOMINAL, CYSTOTOMY WITH BLADDER  REPAIR;  Surgeon: Mora Bellman, MD;  Location: [facility];  Service: Gynecology;  Laterality: N/A;  Requested 09/07/15 @ 2:15p  . BILATERAL SALPINGECTOMY Bilateral 09/07/2015   Procedure: BILATERAL SALPINGECTOMY;  Surgeon: Mora Bellman, MD;  Location: [facility];  Service: Gynecology;  Laterality: Bilateral;  . CHOLECYSTECTOMY N/A 06/08/2017   Procedure: LAPAROSCOPIC CHOLECYSTECTOMY WITH INTRAOPERATIVE CHOLANGIOGRAM;  Surgeon: Donnie Mesa, MD;  Location: [facility];  Service: General;  Laterality: N/A;  . DIAGNOSTIC LAPAROSCOPY    . DILATION AND CURETTAGE OF UTERUS    . ESOPHAGOGASTRODUODENOSCOPY N/A 10/05/2016   Procedure: ESOPHAGOGASTRODUODENOSCOPY (EGD);  Surgeon: Wilford Corner, MD;  Location: [facility];  Service: Endoscopy;  Laterality: N/A;  . FRACTURE SURGERY    . ORIF ULNAR FRACTURE  09/03/2011   Procedure: OPEN REDUCTION INTERNAL FIXATION (ORIF) ULNAR FRACTURE;  Surgeon: Meredith Pel;  Location: [facility];  Service: Orthopedics;  Laterality: Right;  . ORIF ULNAR FRACTURE  12/14/2011   Procedure: OPEN REDUCTION INTERNAL FIXATION (ORIF) ULNAR FRACTURE;  Surgeon: Meredith Pel, MD;  Location: [facility];  Service: Orthopedics;  Laterality: Right;  Right ulnar shaft open reduction, internal fixation with right iliac crest bone graft  . TONSILLECTOMY  FAMILY HISTORY: Family History  Problem Relation Age of Onset  . Cirrhosis Mother   . Diabetes Mellitus I Mother   . Esophageal cancer Father   . Cancer Sister   . Heart attack Brother     SOCIAL HISTORY: Social History   Socioeconomic History  . Marital status: Married    Spouse name: Not on file  . Number of children: 0  . Years of education: HS  . Highest education level: Not on file  Occupational History  . Occupation: Unemployed  Social Needs  . Financial resource strain: Not on file  . Food insecurity:    Worry: Not on file    Inability: Not on file  . Transportation needs:    Medical: Not on file     Non-medical: Not on file  Tobacco Use  . Smoking status: Current Every Day Smoker    Packs/day: 0.10    Years: 35.00    Pack years: 3.50    Types: Cigarettes  . Smokeless tobacco: Never Used  Substance and Sexual Activity  . Alcohol use: No    Comment: 05/15/2017 - last drink  . Drug use: No  . Sexual activity: Never    Birth control/protection: None  Lifestyle  . Physical activity:    Days per week: Patient refused    Minutes per session: Patient refused  . Stress: Not on file  Relationships  . Social connections:    Talks on phone: Patient refused    Gets together: Patient refused    Attends religious service: Patient refused    Active member of club or organization: Patient refused    Attends meetings of clubs or organizations: Patient refused    Relationship status: Patient refused  . Intimate partner violence:    Fear of current or ex partner: Patient refused    Emotionally abused: Patient refused    Physically abused: Patient refused    Forced sexual activity: Patient refused  Other Topics Concern  . Not on file  Social History Narrative   Lives at home with boyfriend.   Right-handed.   1 cup caffeine per day.     PHYSICAL EXAM  There were no vitals filed for this visit. There is no height or weight on file to calculate BMI.  Generalized: Well developed, in no acute distress  Head: normocephalic and atraumatic,. Oropharynx benign  Neck: Supple,  Musculoskeletal: No deformity   Neurological examination   Mentation: Alert oriented to time, place, history taking. Attention span and concentration appropriate. Recent and remote memory intact.  Follows all commands speech and language fluent.   Cranial nerve II-XII: Pupils were equal round reactive to light extraocular movements were full, visual field were full on confrontational test. Facial sensation and strength were normal. hearing was intact to finger rubbing bilaterally. Uvula tongue midline. head turning  and shoulder shrug were normal and symmetric.Tongue protrusion into cheek strength was normal. Motor: normal bulk and tone, full strength in the BUE, BLE, Sensory: normal and symmetric to light touch,  Coordination: finger-nose-finger, heel-to-shin bilaterally, no dysmetria Reflexes: Brachioradialis 2/2, biceps 2/2, triceps 2/2, patellar 2/2, Achilles 2/2, plantar responses were flexor bilaterally. Gait and Station: Rising up from seated position without assistance, normal stance,  moderate stride, good arm swing, smooth turning, able to perform tiptoe, and heel walking without difficulty. Tandem gait is steady.  No assistive device  DIAGNOSTIC DATA (LABS, IMAGING, TESTING) - I reviewed patient records, labs, notes, testing and imaging myself where available.  Lab Results  Component Value Date   WBC 6.8 06/09/2017   HGB 9.2 (L) 06/09/2017   HCT 27.9 (L) 06/09/2017   MCV 103.3 (H) 06/09/2017   PLT 182 06/09/2017      Component Value Date/Time   NA 136 06/09/2017 0451   K 3.5 06/09/2017 0451   CL 110 06/09/2017 0451   CO2 20 (L) 06/09/2017 0451   GLUCOSE 109 (H) 06/09/2017 0451   BUN <5 (L) 06/09/2017 0451   CREATININE 0.53 06/09/2017 0451   CALCIUM 8.3 (L) 06/09/2017 0451   PROT 5.2 (L) 06/09/2017 0451   ALBUMIN 2.0 (L) 06/09/2017 0451   AST 95 (H) 06/09/2017 0451   ALT 35 06/09/2017 0451   ALKPHOS 153 (H) 06/09/2017 0451   BILITOT 0.6 06/09/2017 0451   GFRNONAA >60 06/09/2017 0451   GFRAA >60 06/09/2017 0451   Lab Results  Component Value Date   CHOL 262 (H) 08/26/2010   HDL 118 08/26/2010   LDLCALC 132 (H) 08/26/2010   TRIG 58 08/26/2010   CHOLHDL 2.2 Ratio 08/26/2010    Lab Results  Component Value Date   VITAMINB12 984 (H) 06/06/2017   Lab Results  Component Value Date   TSH 2.352 04/30/2017      ASSESSMENT AND PLAN Cynthia Welch is a 58 y.o. female  here to follow-up for seizure disorder, history of heavy alcohol abuse and history of subdural hematoma  in 2014.  Patient could not tolerate Keppra.  She is currently taking Zonegran 300 mg daily.  She continues to have brief zoning out episodes 2 since last seen in the setting of her brother suddenly dying of heart attack   PLAN: Continue  Zonegran  3 caps daily  Will get level today to check compliance Lamictal 25 mg for 2 weeks then increase to 25mg  twice daily Most recent EEG normal Keep a record of starring spells Please remember, common seizure triggers are: Sleep deprivation, dehydration, overheating, stress, hypoglycemia or skipping meals, certain medications or excessive alcohol use, especially stopping alcohol abruptly if you have had heavy alcohol use before (aka alcohol withdrawal seizure). If you have a prolonged seizure over 2-5 minutes or back to back seizures, call or have someone call 911 or take you to the nearest emergency room. You cannot drive a car or operate any other machinery or vehicle within 6 months of a seizure. Please do not swim alone or take a tub bath for safety.  Take your medicine for seizure prevention regularly and do not skip doses or stop medication abruptly and tone are told to do so by your healthcare provider.  Avoid taking Wellbutrin, narcotic pain medications and tramadol, as they can lower seizure threshold.   F/U in 4 months I spent 25 minutes in total face to face time with the patient more than 50% of which was spent counseling and coordination of care, reviewing test results reviewing medications and discussing and reviewing the diagnosis of seizure disorder and further treatment options.  Patient also asked to keep a record of her staring spells, Dennie Bible, [facility], [facility], APRN  [facility] [address] [place], [place] [address] [phone]

## 2018-12-09 ENCOUNTER — Ambulatory Visit: Payer: Self-pay | Admitting: Nurse Practitioner

## 2018-12-10 ENCOUNTER — Encounter: Payer: Self-pay | Admitting: Nurse Practitioner

## 2019-01-08 ENCOUNTER — Telehealth (HOSPITAL_COMMUNITY): Payer: Self-pay

## 2019-01-08 NOTE — Telephone Encounter (Signed)
Left message with patient regarding appointment on March 26th that will need to be rescheduled.

## 2019-01-14 ENCOUNTER — Telehealth: Payer: Self-pay

## 2019-01-14 NOTE — Telephone Encounter (Signed)
Unable to get in contact with the patient. I left a voicemail letting her know that her appointment with Hoyle Sauer, NP has been cancelled and that she needs to call us to r/s. Office number has been provided.

## 2019-01-15 ENCOUNTER — Ambulatory Visit: Payer: Self-pay | Admitting: Nurse Practitioner

## 2019-01-16 ENCOUNTER — Ambulatory Visit (HOSPITAL_COMMUNITY): Payer: Self-pay

## 2019-03-22 IMAGING — CR DG CHEST 2V
2 series · 2 of 2 positions shown · non-contrast
Comparison: Chest x-ray dated April 29, 2017.

CLINICAL DATA: Chest pain and shortness of breath for 2 days.

EXAM:
CHEST  2 VIEW

[chest lat]
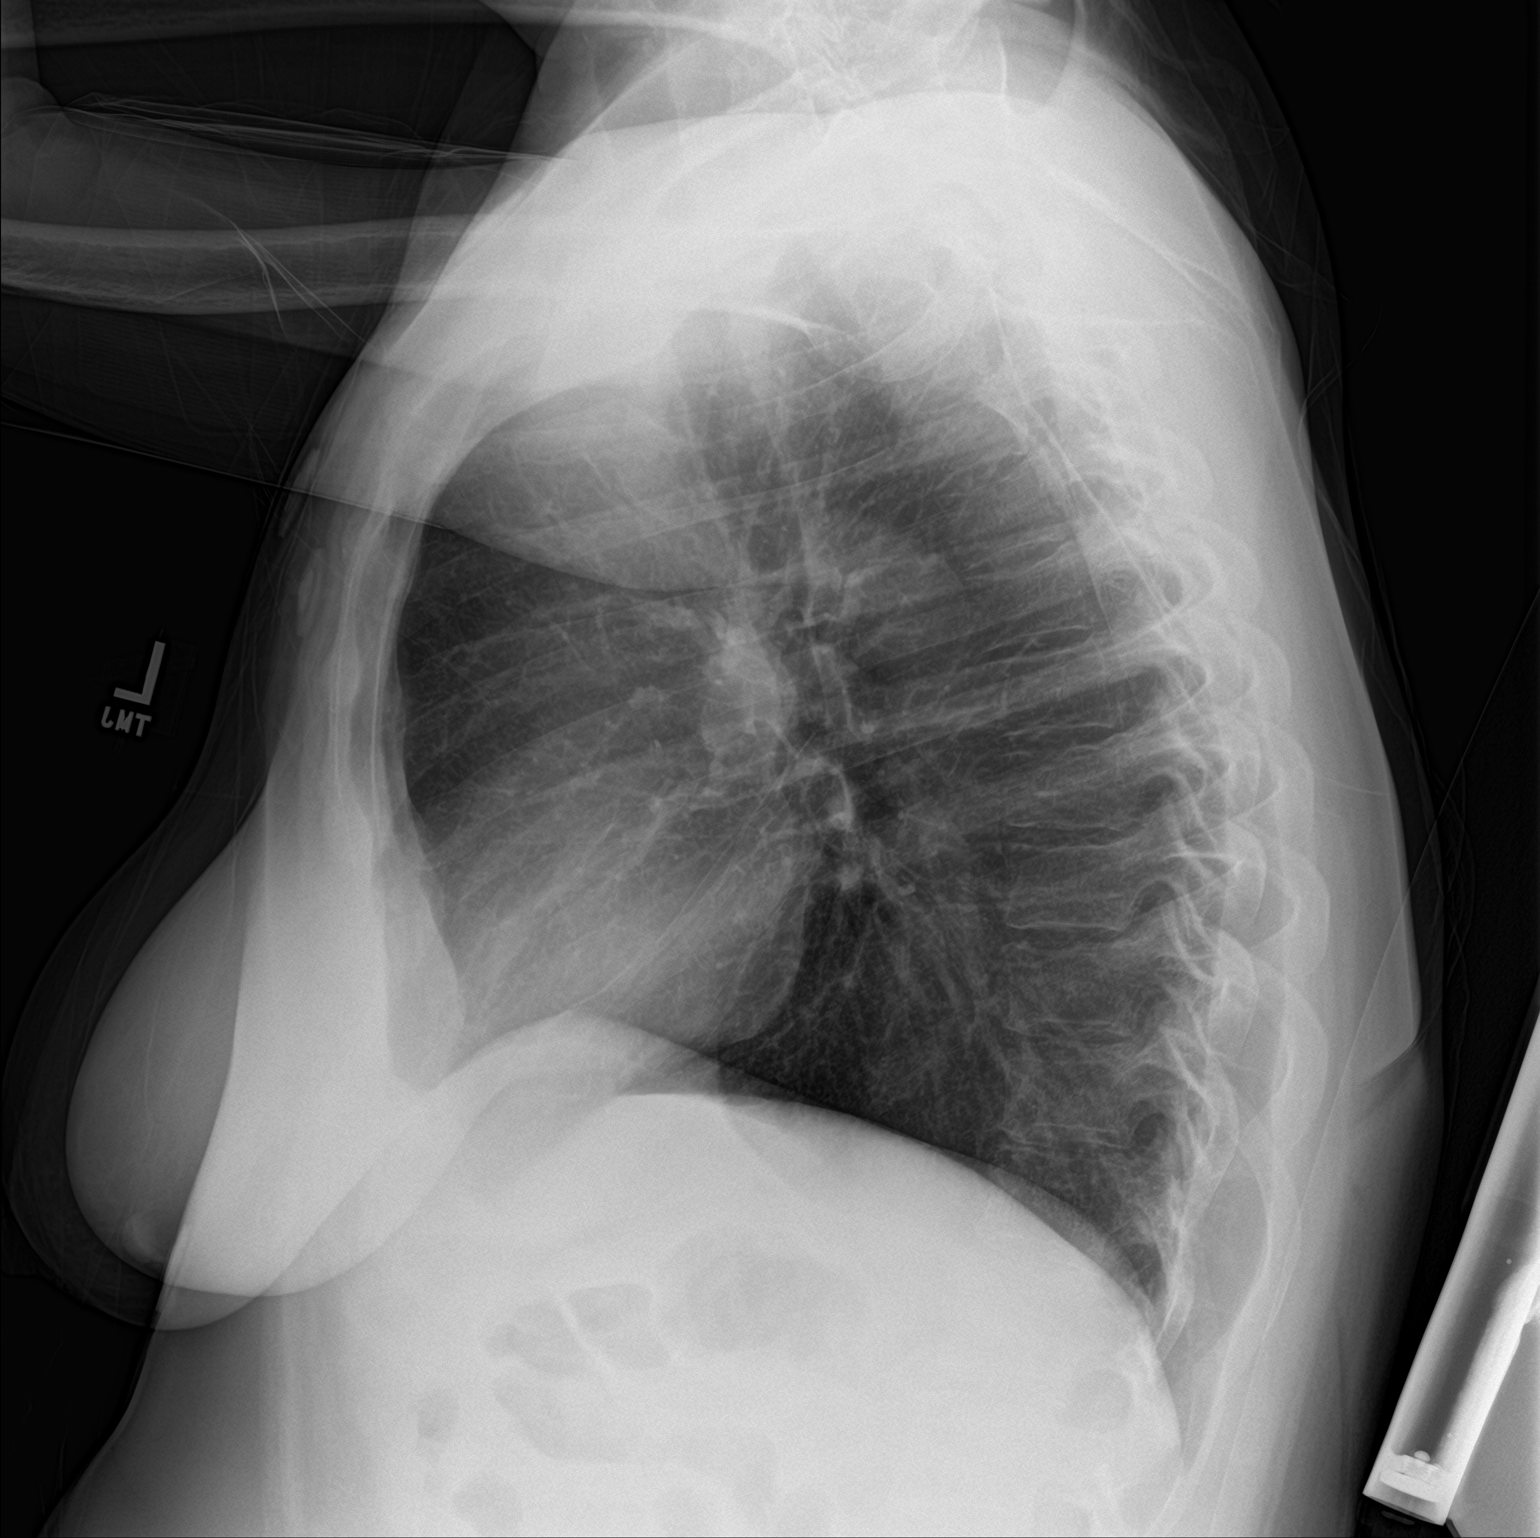

[chest ap]
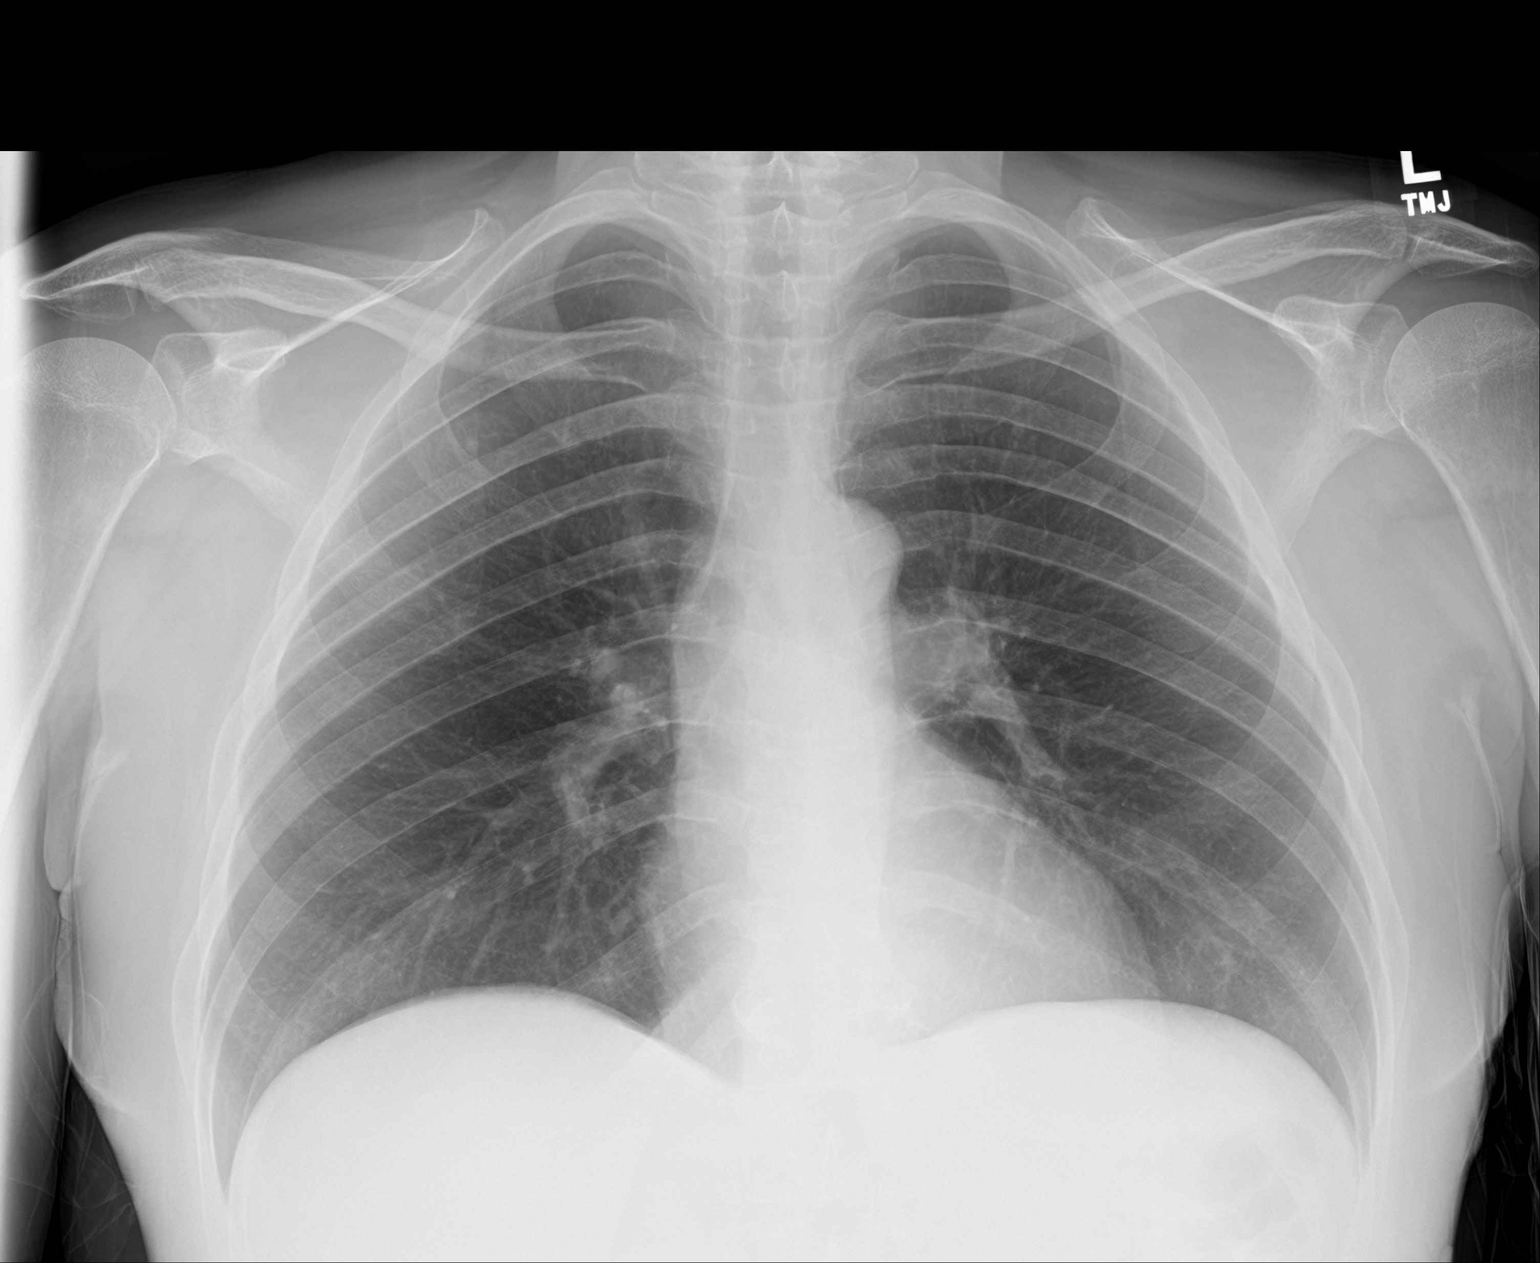

[2 of 2 positions shown; findings below may reference images not displayed]

FINDINGS: The cardiomediastinal silhouette is normal in size. Normal pulmonary
vascularity. No focal consolidation, pleural effusion, or
pneumothorax. No acute osseous abnormality.
IMPRESSION: No active cardiopulmonary disease.

## 2019-04-12 IMAGING — CT CT ABD-PELV W/ CM
2 of 5 series · 14 of 46 positions shown, 16 images · IV contrast (Omni 300)
Comparison: None.

CLINICAL DATA: Abdominal pain and vomiting. History of pancreatitis

EXAM:
CT ABDOMEN AND PELVIS WITH CONTRAST
TECHNIQUE: Multidetector CT imaging of the abdomen and pelvis was performed
using the standard protocol following bolus administration of
intravenous contrast.
CONTRAST:  100mL QIJXJX-[id number] IOPAMIDOL (QIJXJX-[id number]) INJECTION 61%

[Series 3: a/p w/ 5mm · axial · 0.80mm/px · z∈[+942,+1342]mm · 11 of 90 slices shown, 13 images]
[im 5/90  soft-tissue]
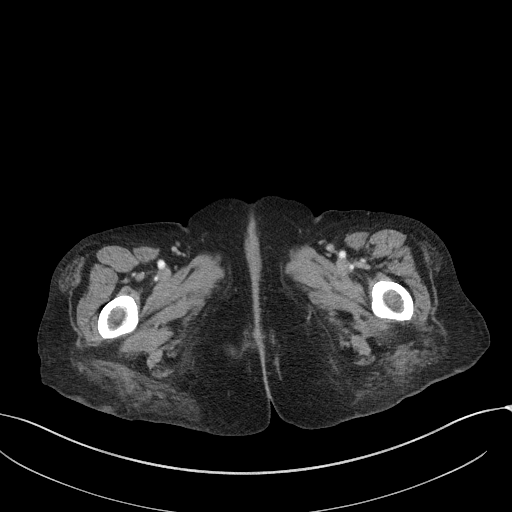
[im 5/90  bone]
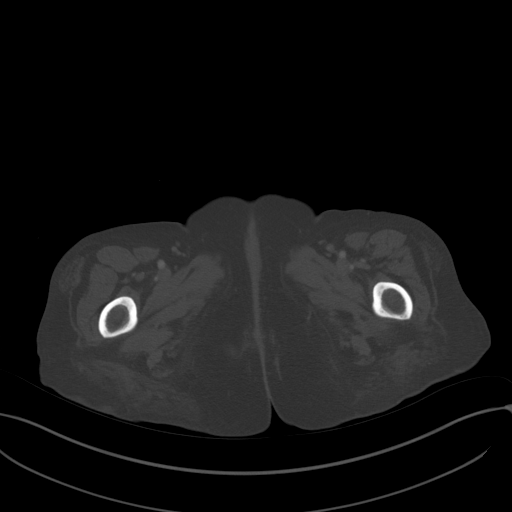
[im 14/90  soft-tissue]
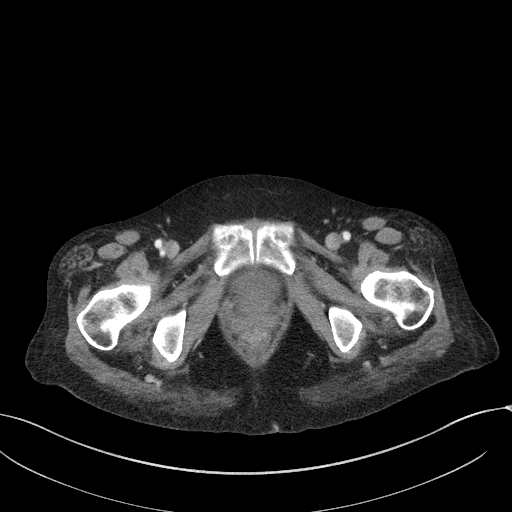
[im 23/90  soft-tissue]
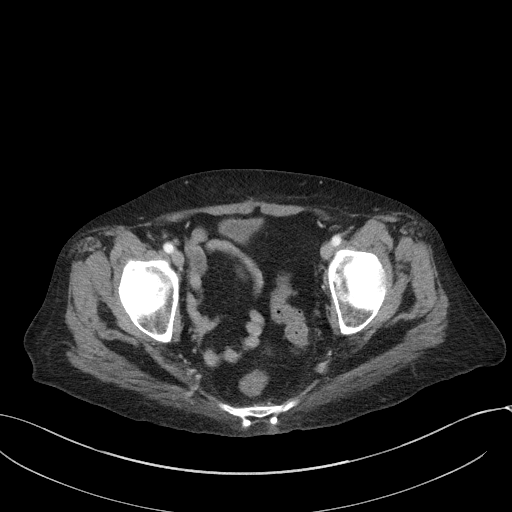
[im 32/90  soft-tissue]
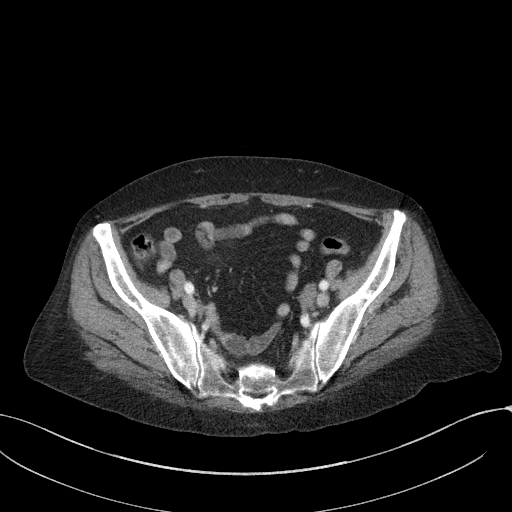
[im 36/90  soft-tissue]
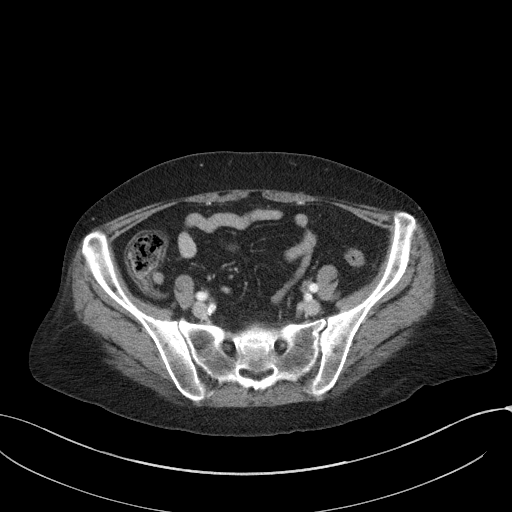
[im 45/90  soft-tissue]
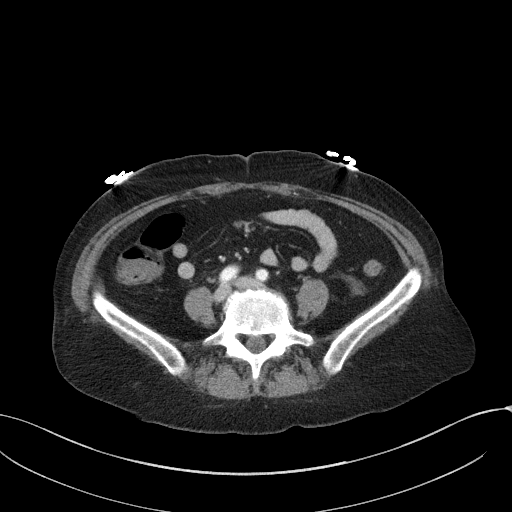
[im 54/90  soft-tissue]
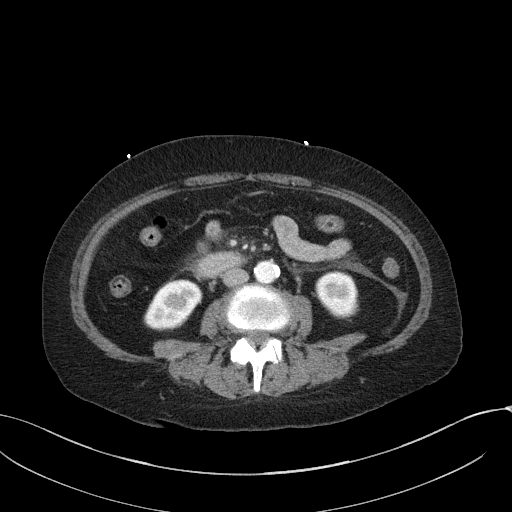
[im 58/90  soft-tissue]
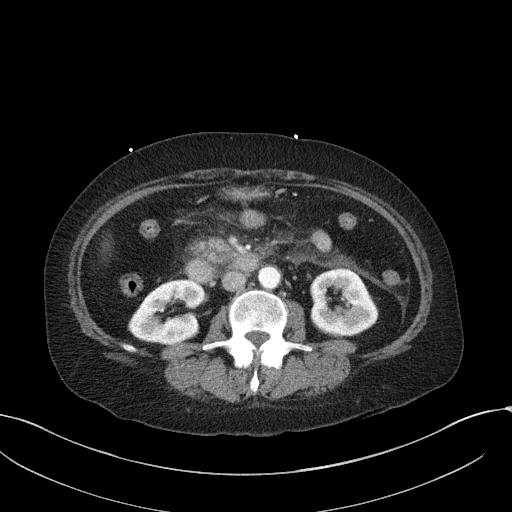
[im 67/90  soft-tissue]
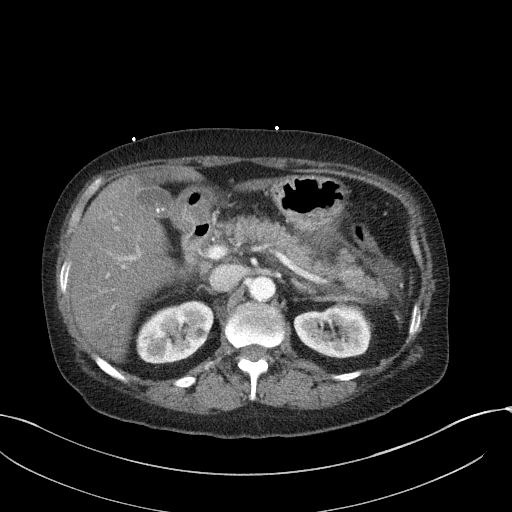
[im 67/90  bone]
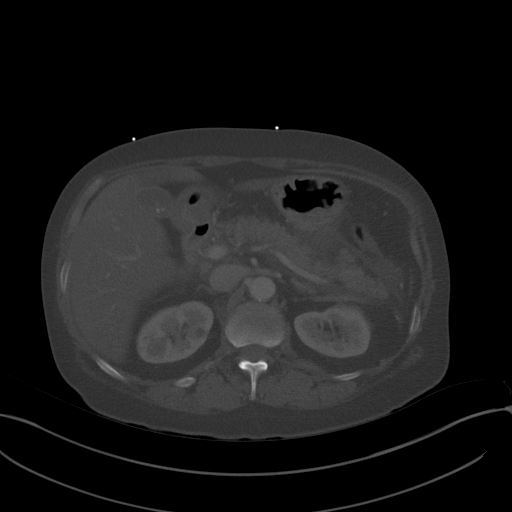
[im 76/90  soft-tissue]
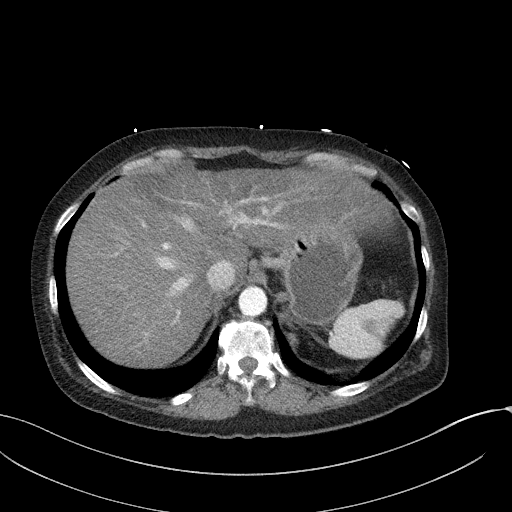
[im 85/90  soft-tissue]
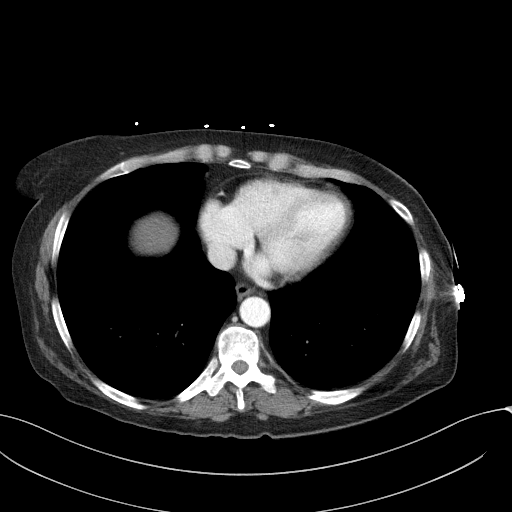

[Series 6: a/p w/ cor · coronal · 0.72mm/px · 3 of 138 slices shown]
[im 46/138  soft-tissue]
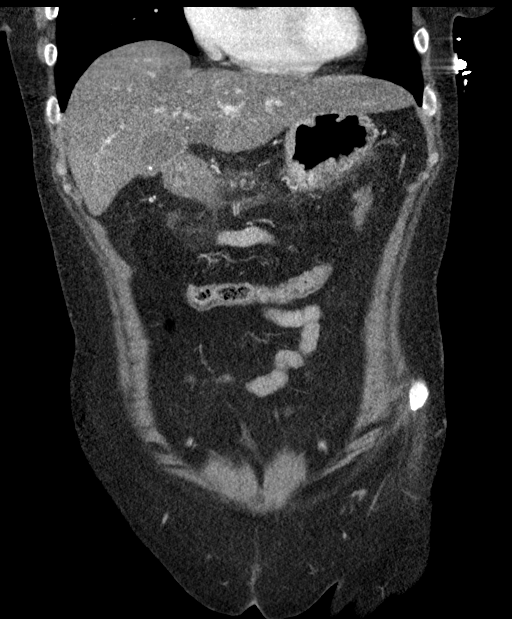
[im 61/138  soft-tissue]
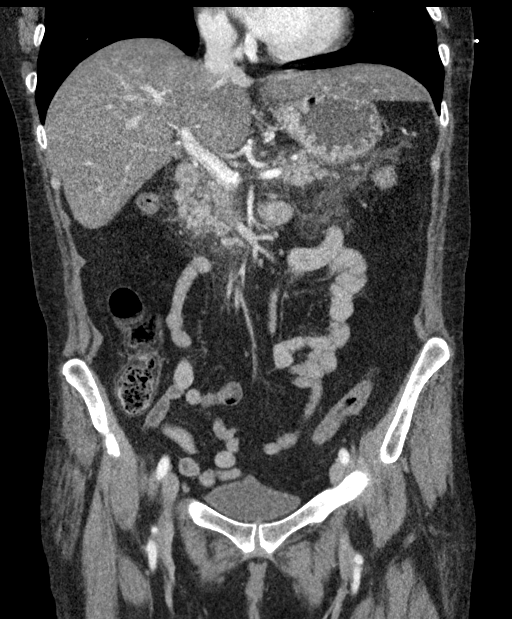
[im 77/138  soft-tissue]
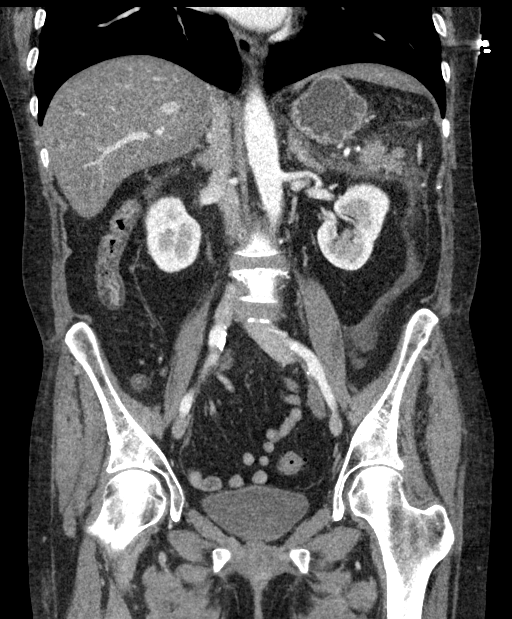

[14 of 46 positions shown; findings below may reference images not displayed]

FINDINGS: Lower chest: Lung bases are clear. There is a small foramen of
[place]och[place]k hernia on the left containing only fat.

Hepatobiliary: There is diffuse hepatic steatosis. No focal liver
lesions are evident. There is cholelithiasis. Gallbladder wall does
appreciably thickened. There is no biliary duct dilatation.

Pancreas: There is peripancreatic fluid surrounding virtually the
entire pancreas. Fluid extends into the left upper quadrant to abut
a portion of the greater curvature of the stomach. There is mild
loculated fluid between the stomach and spleen in the left upper
quadrant. Fluid tracks posteriorly on the left to the level of the
anterior perinephric fascia. Fluid is also seen tracking into the
lateral conal fascia on the left. There is subtle pancreatic edema
throughout most of the pancreas. There is no pancreatic mass or
pseudocyst. No pancreatic duct dilatation.

Spleen: No splenic lesions are evident. There is a cleft in the
lateral midportion of the spleen, either an anatomic variant or
residua of prior splenic trauma.

Adrenals/Urinary tract: Adrenals appear normal bilaterally. Note
that a small amount of peripancreatic fluid abuts the lateral limb
of the left adrenal. There is a cyst arising from the posterior
upper pole right kidney measuring 1.5 x 1.5 cm. There is a cyst in
the medial right kidney upper portion measuring 1.0 x 1.0 cm. There
is no hydronephrosis on either side. There is no renal or ureteral
calculus on either side. Urinary bladder is midline with wall
thickness within normal limits.

Stomach/Bowel: There are scattered sigmoid diverticula without
diverticulitis. There is no appreciable bowel wall or mesenteric
thickening. No evident bowel obstruction. No free air or portal
venous air.

Vascular/Lymphatic: There are foci of atherosclerotic calcification
in the aorta and common iliac arteries. No aneurysm evident. Major
mesenteric vessels appear patent. There is no adenopathy in the
abdomen or pelvis.

Reproductive: Uterus is absent.  There is no evident pelvic mass.

Other: Appendix appears unremarkable. There is no abscess in the
abdomen or pelvis.

Musculoskeletal: There is degenerative change in the lumbar spine
with vacuum phenomenon at L4-5 and L5-S1. There are no blastic or
lytic bone lesions. There is no intramuscular or abdominal wall
lesions.
IMPRESSION: 1. Acute pancreatitis with peripancreatic fluid and mild pancreatic
edema. Fluid tracks into the left upper quadrant to abut the greater
curvature the stomach as well as demonstrated loculated fluid
between the greater curvature of the stomach and spleen. Fluid
tracks on the left into the retroperitoneum with fluid abutting the
lateral limb of the adrenal and extending to the anterior
perinephric fascia on the left. Fluid tracks more inferiorly into
the left lateral conal fascia. There is no evident mass or
pseudocyst. No appreciable pancreatic duct dilatation.

2.  Cholelithiasis.  No appreciable gallbladder wall thickening.

3.  Diffuse hepatic steatosis.

4. Cleft in midportion of spleen. Question residua of old trauma
versus anatomic variant.

5. No bowel obstruction. No abscess. Scattered sigmoid diverticula
without diverticulitis. Appendix appears normal.

6.  No renal or ureteral calculus.  No hydronephrosis.

7.  Foci of aortoiliac atherosclerosis.

Aortic Atherosclerosis ([id number]-[id number].S).

## 2019-05-01 ENCOUNTER — Other Ambulatory Visit: Payer: Self-pay

## 2019-05-01 ENCOUNTER — Encounter (HOSPITAL_COMMUNITY): Payer: Self-pay | Admitting: *Deleted

## 2019-05-01 ENCOUNTER — Ambulatory Visit
Admission: RE | Admit: 2019-05-01 | Discharge: 2019-05-01 | Disposition: A | Discharge status: Home / Self Care | Payer: No Typology Code available for payment source | Source: Ambulatory Visit | Attending: Obstetrics and Gynecology | Admitting: Obstetrics and Gynecology

## 2019-05-01 ENCOUNTER — Encounter (HOSPITAL_COMMUNITY): Payer: Self-pay

## 2019-05-01 ENCOUNTER — Ambulatory Visit (HOSPITAL_COMMUNITY)
Admission: RE | Admit: 2019-05-01 | Discharge: 2019-05-01 | Disposition: A | Discharge status: Home / Self Care | Payer: No Typology Code available for payment source | Source: Ambulatory Visit | Attending: Obstetrics and Gynecology | Admitting: Obstetrics and Gynecology

## 2019-05-01 DIAGNOSIS — Z0000 Encounter for general adult medical examination without abnormal findings: Secondary | ICD-10-CM | POA: Insufficient documentation

## 2019-05-01 DIAGNOSIS — Z1231 Encounter for screening mammogram for malignant neoplasm of breast: Secondary | ICD-10-CM

## 2019-05-01 DIAGNOSIS — Z1239 Encounter for other screening for malignant neoplasm of breast: Secondary | ICD-10-CM | POA: Insufficient documentation

## 2019-05-01 NOTE — Patient Instructions (Signed)
Explained breast self awareness with [name]. Patient did not need a Pap smear today due to patient has a history of a hysterectomy for benign reasons. Let patient know that she doesn't need any further Pap smears due to her history of a hysterectomy for benign reasons. Referred patient to the [facility] for a screening mammogram. Appointment scheduled for Thursday, May 01, 2019 at 0910. Patient aware of appointment and will be there. Let patient know the [facility] will follow up with her within the next couple weeks with results of mammogram by letter or phone. Discussed smoking cessation with patient. Referred patient to the [facility] Quitline and gave resources for free smoking cessation classes at [facility]. Morine Swindell verbalized understanding.  Amadeus Oyama, [place]vil [place], RN 8:21 AM

## 2019-05-01 NOTE — Progress Notes (Signed)
No complaints today.   Pap Smear: Pap smear not completed today. Last Pap smear was 08/05/2015 at [facility] and normal with positive HPV. Per patient has no history of an abnormal Pap smear. Patient has a history of a hysterectomy 09/07/2015 for fibroids. Patient no longer needs Pap smears due to her history of a hysterectomy for benign reasons per BCCCP and ACOG guidelines. Last Pap smear and hysterectomy reports are in Epic.  Physical exam: Breasts Breasts symmetrical. No skin abnormalities bilateral breasts. No nipple retraction bilateral breasts. No nipple discharge bilateral breasts. No lymphadenopathy. No lumps palpated bilateral breasts. No complaints of pain or tenderness on exam. Referred patient to the [facility] for a screening mammogram. Appointment scheduled for Thursday, May 01, 2019 at 0910.      Pelvic/Bimanual No Pap smear completed today since patient has a history of a hysterectomy for benign reasons. Pap smear not indicated per BCCCP guidelines.   Smoking History: Patient is a current smoker. Discussed smoking cessation with patient. Referred patient to the [facility] Quitline and gave resources for free smoking cessation classes at [facility].  Patient Navigation: Patient education provided. Access to services provided for patient through [facility] [place] program.   Colorectal Cancer Screening: Per patient had a colonoscopy completed 4 years ago. FIT Test completed 01/29/2018 that was negative. No complaints today.   Breast and Cervical Cancer Risk Assessment: Patient has a family history of her mother having breast cancer. Patient has no known genetic mutations or history of radiation treatment to the chest before age 41. Patient has no history of cervical dysplasia, immunocompromised, or DES exposure in-utero.  Risk Assessment    Risk Scores      05/01/2019   Last edited by: Armond Hang, LPN   5-year risk: 2.2 %   Lifetime risk: 12.3 %

## 2019-07-07 ENCOUNTER — Ambulatory Visit: Payer: No Typology Code available for payment source

## 2019-07-07 ENCOUNTER — Other Ambulatory Visit: Payer: No Typology Code available for payment source | Admitting: Acute Care

## 2019-07-07 ENCOUNTER — Encounter: Payer: Self-pay | Admitting: Acute Care

## 2019-07-07 ENCOUNTER — Other Ambulatory Visit: Payer: Self-pay

## 2019-07-07 DIAGNOSIS — F17210 Nicotine dependence, cigarettes, uncomplicated: Secondary | ICD-10-CM

## 2019-07-07 NOTE — Progress Notes (Signed)
Shared Decision-Making Visit for Lung Cancer Screening [phone] )  Lung Cancer Screening Criteria 54-43-years of age 56+ pack year smoking history No Recent History of coughing up blood   No Unexplained weight loss of > 15 pounds in the last 6 months. No Prior History Lung / other cancer  (Diagnosis within the last 5 years already requiring surveillance chest CT Scans). Pt is a current smoker, or former smoker who has quit within the last 15 years.  This patient meets the criterial noted above and is asymptomatic for any signs or symptoms of lung cancer.  The Shared Decision-Making Visit discussion included risks and benefits of screening, potential for follow up diagnostic testing for abnormal scans, potential for false positive tests, over diagnosis, and discussion about total radiation exposure. Patient stated willingness to undergo diagnostics and treatment as needed. Current smokers were counseled on smoking cessation as the single most powerful action they can take to decrease their risk of lung cancer, pulmonary disease, heart disease and stroke. They were given a resource card with information on receiving free nicotine replacement therapy , and information about free smoking cessation classes.  Pt understands that this scan is being paid for by a grant obtained by the [facility]. We discussed  that there is no guarantee of additional grant money from year to year as these grants are not guaranteed to programs on an annual basis.They have to be applied for and they are awarded based on county need, and utilization of the previous years funds..   Doing well, 35 pack year smoking history. Currently smoking 1 pack per day. No PFT's on file. She states she has no dyspnea with exertion, she does not use any inhalers at present. She states she will work on quitting smoking. Smoking cessation card provided. Blue Card #4  Magdalen Spatz, AGACNP-BC [place] Pager # [phone] 07/07/2019 6:32 PM

## 2019-07-10 ENCOUNTER — Emergency Department (HOSPITAL_COMMUNITY)
Admission: EM | Admit: 2019-07-10 | Discharge: 2019-07-11 | Disposition: A | Discharge status: Home / Self Care | Payer: No Typology Code available for payment source | Attending: Emergency Medicine | Admitting: Emergency Medicine

## 2019-07-10 ENCOUNTER — Other Ambulatory Visit: Payer: Self-pay

## 2019-07-10 ENCOUNTER — Encounter (HOSPITAL_COMMUNITY): Payer: Self-pay | Admitting: Emergency Medicine

## 2019-07-10 ENCOUNTER — Emergency Department (HOSPITAL_COMMUNITY): Payer: No Typology Code available for payment source

## 2019-07-10 DIAGNOSIS — Z8669 Personal history of other diseases of the nervous system and sense organs: Secondary | ICD-10-CM | POA: Insufficient documentation

## 2019-07-10 DIAGNOSIS — K852 Alcohol induced acute pancreatitis without necrosis or infection: Secondary | ICD-10-CM | POA: Insufficient documentation

## 2019-07-10 DIAGNOSIS — F17210 Nicotine dependence, cigarettes, uncomplicated: Secondary | ICD-10-CM | POA: Insufficient documentation

## 2019-07-10 DIAGNOSIS — R0789 Other chest pain: Secondary | ICD-10-CM | POA: Insufficient documentation

## 2019-07-10 DIAGNOSIS — R110 Nausea: Secondary | ICD-10-CM | POA: Insufficient documentation

## 2019-07-10 DIAGNOSIS — I10 Essential (primary) hypertension: Secondary | ICD-10-CM | POA: Insufficient documentation

## 2019-07-10 LAB — CBC
HCT: 45.8 % (ref 36.0–46.0)
Hemoglobin: 15 g/dL (ref 12.0–15.0)
MCH: 33.8 pg (ref 26.0–34.0)
MCHC: 32.8 g/dL (ref 30.0–36.0)
MCV: 103.2 fL — ABNORMAL HIGH (ref 80.0–100.0)
Platelets: 242 10*3/uL (ref 150–400)
RBC: 4.44 MIL/uL (ref 3.87–5.11)
RDW: 12 % (ref 11.5–15.5)
WBC: 8 10*3/uL (ref 4.0–10.5)
nRBC: 0 % (ref 0.0–0.2)

## 2019-07-10 LAB — BASIC METABOLIC PANEL
Anion gap: 11 (ref 5–15)
BUN: 6 mg/dL (ref 6–20)
CO2: 21 mmol/L — ABNORMAL LOW (ref 22–32)
Calcium: 9.4 mg/dL (ref 8.9–10.3)
Chloride: 107 mmol/L (ref 98–111)
Creatinine, Ser: 0.77 mg/dL (ref 0.44–1.00)
GFR calc Af Amer: >60 mL/min (ref >60)
GFR calc non Af Amer: >60 mL/min (ref >60)
Glucose, Bld: 106 mg/dL — ABNORMAL HIGH (ref 70–99)
Potassium: 3.8 mmol/L (ref 3.5–5.1)
Sodium: 139 mmol/L (ref 135–145)

## 2019-07-10 LAB — TROPONIN I (HIGH SENSITIVITY): Troponin I (High Sensitivity): 3 ng/L (ref <18)

## 2019-07-10 LAB — LIPASE, BLOOD: Lipase: 100 U/L — ABNORMAL HIGH (ref 11–51)

## 2019-07-10 MED ORDER — SODIUM CHLORIDE 0.9% FLUSH: 3.0000 mL | Freq: Once | INTRAVENOUS | Status: DC

## 2019-07-10 NOTE — ED Triage Notes (Signed)
Pt to ED with c/o epigastric pain x's 3 weeks.  Worse at night.  Started having chest pain x's days ago.  Nausea without vomiting,.

## 2019-07-11 ENCOUNTER — Other Ambulatory Visit: Payer: Self-pay | Admitting: *Deleted

## 2019-07-11 ENCOUNTER — Emergency Department (HOSPITAL_COMMUNITY): Payer: No Typology Code available for payment source

## 2019-07-11 DIAGNOSIS — Z122 Encounter for screening for malignant neoplasm of respiratory organs: Secondary | ICD-10-CM

## 2019-07-11 DIAGNOSIS — F17210 Nicotine dependence, cigarettes, uncomplicated: Secondary | ICD-10-CM

## 2019-07-11 MED ORDER — LIDOCAINE VISCOUS HCL 2 % MT SOLN
15.0000 mL | Freq: Once | OROMUCOSAL | Status: AC
Start: 2019-07-11 — End: 2019-07-11
  Administered 2019-07-11: 15 mL via ORAL
  Filled 2019-07-11: qty 15

## 2019-07-11 MED ORDER — ALUM & MAG HYDROXIDE-SIMETH 200-200-20 MG/5ML PO SUSP
30.0000 mL | Freq: Once | ORAL | Status: AC
  Administered 2019-07-11: 30 mL via ORAL
  Filled 2019-07-11: qty 30

## 2019-07-11 MED ORDER — ONDANSETRON HCL 4 MG PO TABS: 4.0000 mg | ORAL_TABLET | Freq: Three times a day (TID) | ORAL | 0 refills | Status: DC | PRN

## 2019-07-11 MED ORDER — IOHEXOL 300 MG/ML  SOLN
100.0000 mL | Freq: Once | INTRAMUSCULAR | Status: AC | PRN
  Administered 2019-07-11: 100 mL via INTRAVENOUS

## 2019-07-11 MED ORDER — SODIUM CHLORIDE 0.9 % IV BOLUS
1000.0000 mL | Freq: Once | INTRAVENOUS | Status: AC
  Administered 2019-07-11: 1000 mL via INTRAVENOUS

## 2019-07-11 MED ORDER — MORPHINE SULFATE (PF) 4 MG/ML IV SOLN
4.0000 mg | Freq: Once | INTRAVENOUS | Status: AC
  Administered 2019-07-11: 4 mg via INTRAVENOUS
  Filled 2019-07-11: qty 1

## 2019-07-11 MED ORDER — ONDANSETRON HCL 4 MG/2ML IJ SOLN
4.0000 mg | Freq: Once | INTRAMUSCULAR | Status: AC
  Administered 2019-07-11: 11:00:00 4 mg via INTRAVENOUS
  Filled 2019-07-11: qty 2

## 2019-07-11 NOTE — ED Provider Notes (Signed)
[facility] EMERGENCY DEPARTMENT Provider Note   CSN: [id number] Arrival date & time: 07/10/19  1713     History   Chief Complaint Chief Complaint  Patient presents with  . Abdominal Pain  . Chest Pain    HPI Cynthia Welch is a 58 y.o. female.     The history is provided by the patient. No language interpreter was used.  Abdominal Pain Associated symptoms: chest pain   Chest Pain Associated symptoms: abdominal pain      58 year old female with hx of GERD, seizure, alcohol abuse, pancreatitis presenting c/o abd pain.  Patient report for the past month she has had intermittent epigastric abdominal pain.  She describes it as a ripping and pulling pain and within the past 3 to 4 days she noticed the pain has increased towards her chest and radiates to her back.  Pain worse when she lays down, improves when she leans forward.  Pain is episodic and however currently here she rates her pain is 10 out of 10.  At home she has tried using Pepto-Bismol and Advil without adequate relief.  She report eating much less but does not correlate postprandial pain.  She endorsed nausea without vomiting or diarrhea constipation.  She admits to drinking alcohol on occasion but much less than before.  Last alcohol use was approximately a week ago.  She has had prior cholecystectomy.  She denies having fever chills no shortness of breath or productive cough.  She denies any bowel bladder changes and denies any black or tarry stool.   Past Medical History:  Diagnosis Date  . Anxiety   . Childhood asthma   . Depression   . Fibroids   . GERD (gastroesophageal reflux disease)   . Headache    "monthly" (05/15/2017)  . High cholesterol   . Hypertension   . Pneumonia    "twice when I was young" (05/15/2017)  . PONV (postoperative nausea and vomiting)    in the past "used to get nauseous"  . Seizures ([place]) last sz 04/2017   "I've had grand mal; usually hit the floor and wake up w/in  a minute or so;last one was today, 05/15/2017"  . Subdural hematoma ([place]) 01/29/2013   Per MRI 01/2013; "had a seizure and busted my head open"    Patient Active Problem List   Diagnosis Date Noted  . Screening breast examination 05/01/2019  . Therapeutic drug monitoring 08/07/2018  . History of alcohol use 07/18/2017  . Malnutrition of moderate degree 06/07/2017  . Acute pancreatitis 06/05/2017  . Macrocytic anemia 06/05/2017  . Alcohol abuse with intoxication ([place]) 05/16/2017  . Seizure ([place]) 05/15/2017  . Dehydration 05/15/2017  . Protein-calorie malnutrition, severe 05/01/2017  . Hyponatremia 04/29/2017  . Syncope 04/29/2017  . Nausea & vomiting 04/29/2017  . Abdominal tenderness, epigastric 10/05/2016  . Esophageal reflux 10/05/2016  . S/P TAH (total abdominal hysterectomy) 09/07/2015  . Hypokalemia 01/29/2013  . Subdural hematoma ([place]) 01/29/2013  . Seizure disorder ([place]) 08/10/2010  . DENTAL CARIES 06/16/2010  . GANGLION CYST, WRIST, LEFT 01/26/2009  . TOBACCO ABUSE 11/19/2008  . LEUKOCYTOPENIA UNSPECIFIED 12/13/2007  . SKIN RASH 12/13/2007  . Elevated LFTs 12/13/2007  . HYPERCHOLESTEROLEMIA 10/31/2007  . HYPERTENSION, BENIGN ESSENTIAL 10/30/2007  . FIBROIDS, UTERUS 09/26/2007    Past Surgical History:  Procedure Laterality Date  . ABDOMINAL HYSTERECTOMY N/A 09/07/2015   Procedure: HYSTERECTOMY ABDOMINAL, CYSTOTOMY WITH BLADDER REPAIR;  Surgeon: Mora Bellman, MD;  Location: [facility];  Service: Gynecology;  Laterality: N/A;  Requested 09/07/15 @ 2:15p  . BILATERAL SALPINGECTOMY Bilateral 09/07/2015   Procedure: BILATERAL SALPINGECTOMY;  Surgeon: Mora Bellman, MD;  Location: [facility];  Service: Gynecology;  Laterality: Bilateral;  . CHOLECYSTECTOMY N/A 06/08/2017   Procedure: LAPAROSCOPIC CHOLECYSTECTOMY WITH INTRAOPERATIVE CHOLANGIOGRAM;  Surgeon: Donnie Mesa, MD;  Location: [facility];  Service: General;  Laterality: N/A;  . DIAGNOSTIC LAPAROSCOPY    . DILATION AND  CURETTAGE OF UTERUS    . ESOPHAGOGASTRODUODENOSCOPY N/A 10/05/2016   Procedure: ESOPHAGOGASTRODUODENOSCOPY (EGD);  Surgeon: Wilford Corner, MD;  Location: [facility];  Service: Endoscopy;  Laterality: N/A;  . FRACTURE SURGERY    . ORIF ULNAR FRACTURE  09/03/2011   Procedure: OPEN REDUCTION INTERNAL FIXATION (ORIF) ULNAR FRACTURE;  Surgeon: Meredith Pel;  Location: [facility];  Service: Orthopedics;  Laterality: Right;  . ORIF ULNAR FRACTURE  12/14/2011   Procedure: OPEN REDUCTION INTERNAL FIXATION (ORIF) ULNAR FRACTURE;  Surgeon: Meredith Pel, MD;  Location: [facility];  Service: Orthopedics;  Laterality: Right;  Right ulnar shaft open reduction, internal fixation with right iliac crest bone graft  . TONSILLECTOMY       OB History    Gravida  0   Para  0   Term  0   Preterm  0   AB  0   Living  0     SAB  0   TAB  0   Ectopic  0   Multiple  0   Live Births  0            Home Medications    Prior to Admission medications   Medication Sig Start Date End Date Taking? Authorizing Provider  Cholecalciferol (VITAMIN D3) 2000 UNITS TABS Take 2,000 Units by mouth daily.     [provider]  folic acid (FOLVITE) 1 MG tablet Take 1 tablet (1 mg total) by mouth daily. 05/01/17   Johnson, Clanford L, MD  lamoTRIgine (LAMICTAL) 25 MG tablet Take 1 tablet (25 mg total) by mouth daily. 1 po daily for 2 weeks then increase to 1 twice daily Patient not taking: Reported on 05/01/2019 08/07/18   Dennie Bible, NP  metoprolol succinate (TOPROL-XL) 25 MG 24 hr tablet Take 25 mg by mouth daily. 10/30/17   [provider]  Multiple Vitamin (MULTIVITAMIN WITH MINERALS) TABS tablet Take 1 tablet by mouth daily. 06/10/17   Aline August, MD  POTASSIUM PO Take by mouth daily.    [provider]  thiamine 100 MG tablet Take 1 tablet (100 mg total) by mouth daily. Patient not taking: Reported on 05/01/2019 05/02/17   Murlean Iba, MD  zonisamide  (ZONEGRAN) 100 MG capsule Take 4 capsules (400 mg total) by mouth daily. 4 caps daily 08/08/18   Dennie Bible, NP    Family History Family History  Problem Relation Age of Onset  . Cirrhosis Mother   . Diabetes Mellitus I Mother   . Breast cancer Mother   . Esophageal cancer Father   . Hypertension Father   . Cancer Sister   . Heart attack Brother     Social History Social History   Tobacco Use  . Smoking status: Current Every Day Smoker    Packs/day: 1.00    Years: 41.00    Pack years: 41.00    Types: Cigarettes  . Smokeless tobacco: Never Used  Substance Use Topics  . Alcohol use: No    Comment: 05/15/2017 - last drink  . Drug use: No  Allergies   Acetaminophen   Review of Systems Review of Systems  Cardiovascular: Positive for chest pain.  Gastrointestinal: Positive for abdominal pain.  All other systems reviewed and are negative.    Physical Exam Updated Vital Signs BP (!) 130/95 (BP Location: Right Arm)   Pulse 82   Temp 99.2 F (37.3 C) (Oral)   Resp 16   Ht 5\' 8"  (1.727 m)   Wt 77.1 kg   LMP 07/12/2015 (Approximate)   SpO2 100%   BMI 25.85 kg/m   Physical Exam Vitals signs and nursing note reviewed.  Constitutional:      General: She is not in acute distress.    Appearance: She is well-developed.  HENT:     Head: Atraumatic.  Eyes:     Conjunctiva/sclera: Conjunctivae normal.  Neck:     Musculoskeletal: Neck supple.  Cardiovascular:     Rate and Rhythm: Normal rate and regular rhythm.  Pulmonary:     Effort: Pulmonary effort is normal.     Breath sounds: Normal breath sounds.  Abdominal:     General: Abdomen is flat.     Palpations: Abdomen is soft.     Tenderness: There is abdominal tenderness in the epigastric area. Negative signs include Murphy's sign, Rovsing's sign and McBurney's sign.  Skin:    Findings: No rash.  Neurological:     Mental Status: She is alert.      ED Treatments / Results  Labs (all labs  ordered are listed, but only abnormal results are displayed) Labs Reviewed  BASIC METABOLIC PANEL - Abnormal; Notable for the following components:      Result Value   CO2 21 (*)    Glucose, Bld 106 (*)    All other components within normal limits  CBC - Abnormal; Notable for the following components:   MCV 103.2 (*)    All other components within normal limits  LIPASE, BLOOD - Abnormal; Notable for the following components:   Lipase 100 (*)    All other components within normal limits  TROPONIN I (HIGH SENSITIVITY)    EKG EKG Interpretation  Date/Time:  Thursday July 10 2019 17:24:25 EDT Ventricular Rate:  82 PR Interval:  174 QRS Duration: 90 QT Interval:  424 QTC Calculation: 495 R Axis:   -3 Text Interpretation:  Normal sinus rhythm Low voltage QRS Cannot rule out Anterior infarct , age undetermined Abnormal ECG Confirmed by Sherwood Gambler [phone]) on 07/11/2019 7:40:42 AM   Radiology Dg Chest 2 View  Result Date: 07/10/2019 CLINICAL DATA:  Epigastric pain for 3 weeks. EXAM: CHEST - 2 VIEW COMPARISON:  May 15, 2017 FINDINGS: The heart size and mediastinal contours are within normal limits. Both lungs are clear. The visualized skeletal structures are unremarkable. IMPRESSION: No active cardiopulmonary disease. Electronically Signed   By: Dorise Bullion III M.D   On: 07/10/2019 18:40   Ct Abdomen Pelvis W Contrast  Result Date: 07/11/2019 CLINICAL DATA:  Suspect pancreatitis.  Mid abdominal pain. EXAM: CT ABDOMEN AND PELVIS WITH CONTRAST TECHNIQUE: Multidetector CT imaging of the abdomen and pelvis was performed using the standard protocol following bolus administration of intravenous contrast. CONTRAST:  176mL OMNIPAQUE IOHEXOL 300 MG/ML  SOLN COMPARISON:  CT 06/05/2017 and MRI abdomen 06/06/2017 FINDINGS: Lower chest: Lung bases are normal. Hepatobiliary: Mild diffuse low-attenuation of the liver with 2 small cysts unchanged from previous MRI. Previous cholecystectomy.  Biliary tree is unremarkable. Pancreas: There is stranding of the peripancreatic fat with mild adjacent fluid involving the  tail of the pancreas compatible with acute pancreatitis. No evidence of necrosis. Spleen: Unchanged. Adrenals/Urinary Tract: Adrenal glands are normal. Kidneys are normal in size without hydronephrosis or nephrolithiasis. Few small right renal cysts unchanged. Ureters and bladder are normal. Stomach/Bowel: Stomach and small bowel are normal. Appendix is normal. Minimal diverticulosis of the colon. Vascular/Lymphatic: Minimal calcified plaque over the abdominal aorta. No adenopathy. Reproductive: Previous hysterectomy. Minimal cystic change over the right ovary. Left ovary is normal. Other: None. Musculoskeletal: Unchanged. IMPRESSION: Evidence of mild acute pancreatitis involving the tail the pancreas. No evidence of necrosis or other complications. Two small liver cysts unchanged. Several small right renal cysts unchanged.  Mild hepatic steatosis. Mild colonic diverticulosis. Aortic Atherosclerosis (ICD10-I70.0). Electronically Signed   By: Marin Olp M.D.   On: 07/11/2019 12:10    Procedures Procedures (including critical care time)  Medications Ordered in ED Medications  sodium chloride flush (NS) 0.9 % injection 3 mL (has no administration in time range)  alum & mag hydroxide-simeth (MAALOX/MYLANTA) 200-200-20 MG/5ML suspension 30 mL (30 mLs Oral Given 07/11/19 0953)    And  lidocaine (XYLOCAINE) 2 % viscous mouth solution 15 mL (15 mLs Oral Given 07/11/19 0953)  sodium chloride 0.9 % bolus 1,000 mL (1,000 mLs Intravenous New Bag/Given 07/11/19 1156)  ondansetron (ZOFRAN) injection 4 mg (4 mg Intravenous Given 07/11/19 1127)  morphine 4 MG/ML injection 4 mg (4 mg Intravenous Given 07/11/19 1127)  iohexol (OMNIPAQUE) 300 MG/ML solution 100 mL (100 mLs Intravenous Contrast Given 07/11/19 1138)     Initial Impression / Assessment and Plan / ED Course  I have reviewed the  triage vital signs and the nursing notes.  Pertinent labs & imaging results that were available during my care of the patient were reviewed by me and considered in my medical decision making (see chart for details).  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  Symptom has been ongoing for the past month worsened for the past few days.  On exam she does have some mild epigastric tenderness without guarding or rebound tenderness.  Abdomen otherwise soft nontender.  She is well-appearing.  Vital signs stable.  Labs remarkable for mildly elevated lipase of 100 which may contribute to his symptoms however I suspect Gastritis/PUD is more likely.  Will give GI cocktail and monitor.  Symptom is atypical of ACS.  Chest x-ray, and high-sensitivity troponins normal.  EKG is unremarkable.  11:12 AM After receiving GI cocktail patient report minimal improvement.  Given elevated lipase level, will obtain abdominal pelvis CT scan for evaluation of her condition.  We will continue with pain management.  12:28 PM Abdominal  pelvis CT scan demonstrate evidence of mild acute pancreatitis involving the tail of the pancreas without any evidence of necrosis or other complications.  After receiving 4 mg of morphine, patient felt much better.  At this time, she is stable for discharge.   Recommend clear liquid diet for the next few days to allow pancreas to improved.  Patient is then to return if her condition worsen.  I strongly urged patient to avoid alcohol use as it would likely worsen her symptom.   Domenic Moras, PA-C 07/11/19 1231    Sherwood Gambler, MD 07/11/19 [phone]

## 2019-07-11 NOTE — ED Notes (Signed)
Pt stated she would be going outside to wait.

## 2019-07-21 ENCOUNTER — Ambulatory Visit
Admission: RE | Admit: 2019-07-21 | Discharge: 2019-07-21 | Disposition: A | Discharge status: Home / Self Care | Payer: No Typology Code available for payment source | Source: Ambulatory Visit | Attending: Acute Care | Admitting: Acute Care

## 2019-07-21 DIAGNOSIS — F17210 Nicotine dependence, cigarettes, uncomplicated: Secondary | ICD-10-CM

## 2019-07-21 DIAGNOSIS — Z122 Encounter for screening for malignant neoplasm of respiratory organs: Secondary | ICD-10-CM

## 2019-07-22 ENCOUNTER — Telehealth: Payer: Self-pay | Admitting: Acute Care

## 2019-07-22 NOTE — Telephone Encounter (Signed)
[place] x 1 to discuss lung screening CT results

## 2019-07-24 NOTE — Telephone Encounter (Signed)
Pt informed of CT results per Eric Form, NP.  PT verbalized understanding.  Copy sent to PCP.  Pt advised that we will f/u with her in 1 year to discuss f/u if grant is still available.

## 2019-08-19 ENCOUNTER — Other Ambulatory Visit: Payer: Self-pay

## 2019-08-19 MED ORDER — ZONISAMIDE 100 MG PO CAPS: 400.0000 mg | ORAL_CAPSULE | Freq: Every day | ORAL | 1 refills | Status: DC

## 2019-08-19 NOTE — Addendum Note (Signed)
Addended by: Kathrynn Ducking on: 08/19/2019 10:44 AM   Modules accepted: Orders

## 2019-08-19 NOTE — Telephone Encounter (Signed)
Received a fax from [facility] where the patient is requesting a refill on her zonisamide (ZONEGRAN) 100 MG capsule

## 2019-08-19 NOTE — Telephone Encounter (Signed)
The prescription for Zonegran will be sent in.

## 2019-12-15 ENCOUNTER — Telehealth: Payer: Self-pay | Admitting: Neurology

## 2020-06-17 ENCOUNTER — Other Ambulatory Visit: Payer: Self-pay | Admitting: Neurology

## 2020-06-29 ENCOUNTER — Other Ambulatory Visit: Payer: Self-pay | Admitting: Neurology

## 2020-07-06 ENCOUNTER — Telehealth: Payer: Self-pay | Admitting: Neurology

## 2020-07-06 MED ORDER — ZONISAMIDE 100 MG PO CAPS: 400.0000 mg | ORAL_CAPSULE | Freq: Every day | ORAL | 0 refills | Status: DC

## 2020-07-06 NOTE — Telephone Encounter (Signed)
Pt called wanting a refill on her zonisamide (ZONEGRAN) 100 MG capsule Pt has not been to the office in over a year and when mentioned that she is needing a f/u in order to continue to get her medications, pt states that she does not have insurance and can not afford the self pay amount even if payment plan is put in place. Pt would like to discuss with RN to see what she can do to continue to receive her medications. Please advise.

## 2020-07-06 NOTE — Telephone Encounter (Signed)
The patient was last seen 08/07/18. She is unable to afford a return visit. Per vo by Dr. Krista Blue, she will provide the rx for zonisamide 100mg , four capsules daily for a 90-day supply with no additional refills. Prior to her next refill, she is going to contact her PCP office or [facility] to schedule an appt for this prescription to be managed by another office.

## 2020-07-08 ENCOUNTER — Other Ambulatory Visit: Payer: Self-pay | Admitting: Obstetrics and Gynecology

## 2020-07-08 DIAGNOSIS — Z1231 Encounter for screening mammogram for malignant neoplasm of breast: Secondary | ICD-10-CM

## 2020-07-27 ENCOUNTER — Ambulatory Visit: Payer: Self-pay | Admitting: *Deleted

## 2020-07-27 ENCOUNTER — Other Ambulatory Visit: Payer: Self-pay

## 2020-07-27 ENCOUNTER — Ambulatory Visit
Admission: RE | Admit: 2020-07-27 | Discharge: 2020-07-27 | Disposition: A | Discharge status: Home / Self Care | Payer: No Typology Code available for payment source | Source: Ambulatory Visit | Attending: Obstetrics and Gynecology | Admitting: Obstetrics and Gynecology

## 2020-07-27 VITALS — BP 144/88 | Temp 97.5°F | Wt 188.0 lb

## 2020-07-27 DIAGNOSIS — Z1211 Encounter for screening for malignant neoplasm of colon: Secondary | ICD-10-CM

## 2020-07-27 DIAGNOSIS — Z1231 Encounter for screening mammogram for malignant neoplasm of breast: Secondary | ICD-10-CM

## 2020-07-27 DIAGNOSIS — Z1239 Encounter for other screening for malignant neoplasm of breast: Secondary | ICD-10-CM

## 2020-07-27 NOTE — Progress Notes (Signed)
[name] is a 59 y.o. female who presents to [facility] clinic today with no complaints.    Pap Smear: Pap smear not completed today. Last Pap smear was 08/05/2015 at the [facility] [place] [facility] and normal with positive HPV. Per patient has no history of an abnormal Pap smear. Patient has a history of a hysterectomy 09/07/2015 for fibroids. Patient no longer needs Pap smears due to her history of a hysterectomy for benign reasons per BCCCP and ACOG guidelines. Last Pap smear and hysterectomy reports are in Epic.  Physical exam: Breasts Breasts symmetrical. No skin abnormalities bilateral breasts. No nipple retraction bilateral breasts. No nipple discharge bilateral breasts. No lymphadenopathy. No lumps palpated bilateral breasts. No complaints of pain or tenderness on exam.       Pelvic/Bimanual Pap is not indicated today per BCCCP guidelines.   Smoking History: Patientis a current smoker. Discussed smoking cessation with patient. Referred patient to the [facility] Quitline and gave resources for free smoking cessation classes at [facility].   Patient Navigation: Patient education provided. Access to services provided for patient through [facility] program.   Colorectal Cancer Screening: Per patient has never had colonoscopy completed. Patient completed a FIT Test 01/29/2018. No complaints today.    Breast and Cervical Cancer Risk Assessment: Patient has a family history of her mother having breast cancer. Patient has no known genetic mutations or history of radiation treatment to the chest before age 36. Patient has no history of cervical dysplasia, immunocompromised, or DES exposure in-utero.  Risk Assessment    Risk Scores      07/27/2020 05/01/2019   Last edited by: Cynthia Welch, CMA Cynthia Infante H, LPN   5-year risk: 2.2 % 2.2 %   Lifetime risk: 11.7 % 12.3 %         A: BCCCP exam without pap smear No complaints.  P: Referred patient to the  [facility] for a screening mammogram on the mobile unit. Appointment scheduled Tuesday, July 27, 2020 at 1400.  Loletta Parish, RN 07/27/2020 1:49 PM

## 2020-07-27 NOTE — Patient Instructions (Addendum)
Explained breast self awareness with [name]. Patient did not need a Pap smear today due to patient has a history of a hysterectomy for benign reasons. Let patient know that she doesn't need any further Pap smears due to her history of a hysterectomy for benign reasons. Referred patient to the [facility] for a screening mammogram on the mobile unit. Appointment scheduled Tuesday, July 27, 2020 at 1400. Patient escorted to the mobile unit for her screening mammogram following BCCCP appointment. Let patient know the Breast Center will follow up with her within the next couple weeks with results of her mammogram by letter or phone. Discussed smoking cessation with patient. Referred patient to the [facility] Quitline and gave resources for free smoking cessation classes at [facility]. Kati Pauls verbalized understanding.  Avrom Robarts, Arvil Chaco, RN 1:49 PM

## 2021-07-05 ENCOUNTER — Other Ambulatory Visit: Payer: Self-pay | Admitting: Obstetrics and Gynecology

## 2021-08-03 ENCOUNTER — Other Ambulatory Visit: Payer: Self-pay

## 2021-08-03 DIAGNOSIS — Z1231 Encounter for screening mammogram for malignant neoplasm of breast: Secondary | ICD-10-CM

## 2021-10-06 ENCOUNTER — Ambulatory Visit: Payer: Self-pay | Admitting: *Deleted

## 2021-10-06 ENCOUNTER — Other Ambulatory Visit: Payer: Self-pay

## 2021-10-06 ENCOUNTER — Ambulatory Visit
Admission: RE | Admit: 2021-10-06 | Discharge: 2021-10-06 | Disposition: A | Discharge status: Home / Self Care | Payer: No Typology Code available for payment source | Source: Ambulatory Visit | Attending: Obstetrics and Gynecology | Admitting: Obstetrics and Gynecology

## 2021-10-06 VITALS — BP 178/98 | Wt 168.5 lb

## 2021-10-06 DIAGNOSIS — Z1239 Encounter for other screening for malignant neoplasm of breast: Secondary | ICD-10-CM

## 2021-10-06 DIAGNOSIS — Z1231 Encounter for screening mammogram for malignant neoplasm of breast: Secondary | ICD-10-CM

## 2021-10-06 DIAGNOSIS — Z1211 Encounter for screening for malignant neoplasm of colon: Secondary | ICD-10-CM

## 2021-10-06 NOTE — Progress Notes (Signed)
Cynthia Welch is a 60 y.o. female who presents to [facility] clinic today with no complaints.    Pap Smear: Pap smear not completed today. Last Pap smear was 08/05/2015 at the [facility] [place] [facility] and normal with positive HPV. Per patient has no history of an abnormal Pap smear. Patient has a history of a hysterectomy 09/07/2015 for fibroids. Patient no longer needs Pap smears due to her history of a hysterectomy for benign reasons per BCCCP and ASCCP guidelines. Last Pap smear and hysterectomy reports are in Epic.   Physical exam: Breasts Breasts symmetrical. No skin abnormalities bilateral breasts. No nipple retraction bilateral breasts. No nipple discharge bilateral breasts. No lymphadenopathy. No lumps palpated bilateral breasts. No complaints of pain or tenderness on exam.  MS DIGITAL SCREENING BILATERAL  Result Date: 09/20/2017 CLINICAL DATA:  Screening. EXAM: DIGITAL SCREENING BILATERAL MAMMOGRAM WITH CAD COMPARISON:  Previous exam(s). ACR Breast Density Category b: There are scattered areas of fibroglandular density. FINDINGS: There are no findings suspicious for malignancy. Images were processed with CAD. IMPRESSION: No mammographic evidence of malignancy. A result letter of this screening mammogram will be mailed directly to the patient. RECOMMENDATION: Screening mammogram in one year. (Code:SM-B-01Y) BI-RADS CATEGORY  1: Negative. Electronically Signed   By: Margarette Canada M.D.   On: 09/20/2017 15:08   MS DIGITAL SCREENING TOMO BILATERAL  Result Date: 07/29/2020 CLINICAL DATA:  Screening. EXAM: DIGITAL SCREENING BILATERAL MAMMOGRAM WITH TOMO AND CAD COMPARISON:  Previous exam(s). ACR Breast Density Category b: There are scattered areas of fibroglandular density. FINDINGS: There are no findings suspicious for malignancy. Images were processed with CAD. IMPRESSION: No mammographic evidence of malignancy. A result letter of this screening mammogram will be mailed  directly to the patient. RECOMMENDATION: Screening mammogram in one year. (Code:SM-B-01Y) BI-RADS CATEGORY  1: Negative. Electronically Signed   By: Claudie Revering M.D.   On: 07/29/2020 14:38   MS DIGITAL SCREENING TOMO BILATERAL  Result Date: 05/01/2019 CLINICAL DATA:  Screening. EXAM: DIGITAL SCREENING BILATERAL MAMMOGRAM WITH TOMO AND CAD COMPARISON:  Previous exam(s). ACR Breast Density Category b: There are scattered areas of fibroglandular density. FINDINGS: There are no findings suspicious for malignancy. Images were processed with CAD. IMPRESSION: No mammographic evidence of malignancy. A result letter of this screening mammogram will be mailed directly to the patient. RECOMMENDATION: Screening mammogram in one year. (Code:SM-B-01Y) BI-RADS CATEGORY  1: Negative. Electronically Signed   By: Ammie Ferrier M.D.   On: 05/01/2019 09:57         Pelvic/Bimanual Pap is not indicated today per BCCCP guidelines.   Smoking History: Patient is a current smoker. Discussed smoking cessation with patient. Referred patient to the [facility] Quitline and gave resources for free smoking cessation classes at [facility].    Patient Navigation: Patient education provided. Access to services provided for patient through [employer] program.   Colorectal Cancer Screening: Per patient has never had colonoscopy completed. Patient completed a FIT Test 01/29/2018 that was negative. FIT Test given to patient to complete today. No complaints today.    Breast and Cervical Cancer Risk Assessment: Patient has a family history of her mother and sister having breast cancer. Patient has no known genetic mutations or history of radiation treatment to the chest before age 89. Patient has no history of cervical dysplasia, immunocompromised, or DES exposure in-utero.  Risk Assessment   No risk assessment data for the current encounter  Risk Scores       07/27/2020  Last edited by: Royston Bake, CMA   5-year risk: 2.2 %    Lifetime risk: 11.7 %            A: BCCCP exam without pap smear No complaints.  P: Referred patient to the [facility] for a screening mammogram on the mobile unit. Appointment scheduled Thursday, October 06, 2021 at 0930.  Loletta Parish, RN 10/06/2021 8:39 AM

## 2021-10-06 NOTE — Patient Instructions (Addendum)
Explained breast self awareness with [name]. Patient did not need a Pap smear today due to patient has a history of a hysterectomy for benign reasons. Let patient know that she doesn't need any further Pap smears due to her history of a hysterectomy for benign reasons. Referred patient to the [facility] for a screening mammogram on the mobile unit. Appointment scheduled Thursday, October 06, 2021 at 0930. Patient escorted to the mobile unit for her screening mammogram following BCCCP appointment. Let patient know the Breast Center will follow up with her within the next couple weeks with results of her mammogram by letter or phone. Discussed smoking cessation with patient. Referred patient to the [facility] Quitline and gave resources for free smoking cessation classes at [facility]. Cynthia Welch verbalized understanding.  Lathon Adan, Arvil Chaco, RN 8:39 AM

## 2021-10-19 ENCOUNTER — Ambulatory Visit: Payer: No Typology Code available for payment source

## 2021-10-19 ENCOUNTER — Other Ambulatory Visit: Payer: No Typology Code available for payment source

## 2021-10-26 ENCOUNTER — Other Ambulatory Visit: Payer: Self-pay

## 2021-10-26 ENCOUNTER — Inpatient Hospital Stay: Payer: Self-pay | Attending: Obstetrics and Gynecology | Admitting: *Deleted

## 2021-10-26 VITALS — BP 160/92 | Ht 68.0 in | Wt 163.0 lb

## 2021-10-26 DIAGNOSIS — Z0000 Encounter for general adult medical examination without abnormal findings: Secondary | ICD-10-CM

## 2021-10-26 NOTE — Progress Notes (Signed)
[facility] initial screening   Clinical Measurement:  Vitals:   10/26/21 0900  BP: (!) 158/90   Fasting Labs Drawn Today, will review with patient when they result.   Medical History:  Patient states that she  does not know if she has  high cholesterol,  does not know if she has  high blood pressure and she does not have diabetes.  Medications:  Patient states that she does not take medication to lower cholesterol, blood pressure or blood sugar.  Patient does not take an aspirin a day to help prevent a heart attack or stroke.   Blood pressure, self measurement: Patient states that she does measure blood pressure from home. She checks her blood pressure weekly. She shares her readings with a health care provider: no.   Nutrition: Patient states that on average she eats 1 cups of fruit and 1 cups of vegetables per day. Patient states that she does not eat fish at least 2 times per week. Patient eats about half servings of whole grains. Patient drinks less than 36 ounces of beverages with added sugar weekly: yes. Patient is currently watching sodium or salt intake: no. In the past 7 days patient has consumed drinks containing alcohol on 1 days. On a day that patient consumes drinks containing alcohol on average 2 drinks are consumed.      Physical activity:  Patient states that she gets 0 minutes of moderate and 0 minutes of vigorous physical activity each week.  Smoking status:  Patient states that she has is a current smoker .   Quality of life:  Over the past 2 weeks patient states that she had little interest or pleasure in doing things: not at all. She has been feeling down, depressed or hopeless:not at all.    Risk reduction and counseling:   Health Coaching: Spoke with patient about the daily recommendation for fruits and vegetables. Showed patient what a serving size would look like. Patient consumes fish 1-2 times a month. Spoke with patient about heart healthy fish that she can try  adding into weekly diet. Gave suggestions for salmon, tuna, mackerel, sea bass, trout or sardines. Patient does eat whole grain bread and cereals regularly. Patients only current exercise is from work (patient currently works Investment banker, corporate at [employer]).   Goal: Patient would like to make overall healthier lifestyle changes. As part of the changes patient would like to cut back on drinking and the amount of cigarettes. Over the next few months patient would like to stop vaping.   Smoking Cessation: Offered to refer patient to [facility]uit[facility] for smoking cessation assistance. Patient declined referral at this time. Did spend time counseling patient about smoking cessation.    Navigation:  I will notify patient of lab results.  Patient is aware of 2 more health coaching sessions and a follow up. Will refer patient to Internal Medicine for follow-up for elevated BP. Will call patient back with appointment details once appointment has been scheduled.   Time: 25 minutes

## 2021-10-27 LAB — LIPID PANEL
Chol/HDL Ratio: 2.8 ratio (ref 0.0–4.4)
Cholesterol, Total: 232 mg/dL — ABNORMAL HIGH (ref 100–199)
HDL: 84 mg/dL (ref >39)
LDL Chol Calc (NIH): 126 mg/dL — ABNORMAL HIGH (ref 0–99)
Triglycerides: 128 mg/dL (ref 0–149)
VLDL Cholesterol Cal: 22 mg/dL (ref 5–40)

## 2021-10-27 LAB — HEMOGLOBIN A1C
Est. average glucose Bld gHb Est-mCnc: 111 mg/dL
Hgb A1c MFr Bld: 5.5 % (ref 4.8–5.6)

## 2021-10-27 LAB — GLUCOSE, RANDOM: Glucose: 83 mg/dL (ref 70–99)

## 2021-10-31 ENCOUNTER — Telehealth: Payer: Self-pay

## 2021-10-31 LAB — FECAL OCCULT BLOOD, IMMUNOCHEMICAL: Fecal Occult Bld: NEGATIVE

## 2021-10-31 NOTE — Telephone Encounter (Signed)
Attempted to contact patient regarding lab results (FIT Test). Left message on voicemail requesting a return call.

## 2021-11-01 ENCOUNTER — Telehealth: Payer: Self-pay

## 2021-11-01 NOTE — Telephone Encounter (Signed)
Left message for patient about lab results from [facility]. Left name and number for patient to call back.   

## 2021-11-02 ENCOUNTER — Telehealth: Payer: Self-pay

## 2021-11-02 NOTE — Telephone Encounter (Signed)
Health coaching 2    Labs- 232 cholesterol, 126 LDL cholesterol, 128 triglycerides, 84 HDL cholesterol, 5.5 hemoglobin A1C, 83 mean plasma glucose.  Patient understands and is aware of her lab results.   Goals- Spoke with patient about reducing the amount of fried and fatty foods consumed. Reducing the amount of red meats consumed. Try to substitute for lean proteins like chicken, fish or [place]. Eating more whole grain foods. Increasing the amount of fruits and vegetables consumed daily. Exercising for at least 20-30 minutes daily.    Navigation:  Patient is aware of 1 more health coaching sessions and a follow up. Patient is scheduled for follow-up appointment with Internal Medicine for follow-up for elevated labs and BP on Wednesday, November 09, 2021 @ 8:45 am.  Time-  10 minutes

## 2021-11-03 NOTE — Telephone Encounter (Signed)
(  11/01/2021) Patient informed negative FIT test results. Patient verbalized understanding.

## 2021-11-09 ENCOUNTER — Other Ambulatory Visit: Payer: Self-pay

## 2021-11-09 ENCOUNTER — Other Ambulatory Visit (HOSPITAL_COMMUNITY): Payer: Self-pay

## 2021-11-09 ENCOUNTER — Encounter: Payer: Self-pay | Admitting: Student

## 2021-11-09 ENCOUNTER — Ambulatory Visit (INDEPENDENT_AMBULATORY_CARE_PROVIDER_SITE_OTHER): Payer: Self-pay | Admitting: Student

## 2021-11-09 VITALS — BP 138/82 | HR 67 | Temp 98.0°F | Ht 68.0 in | Wt 167.6 lb

## 2021-11-09 DIAGNOSIS — G40909 Epilepsy, unspecified, not intractable, without status epilepticus: Secondary | ICD-10-CM

## 2021-11-09 DIAGNOSIS — E7800 Pure hypercholesterolemia, unspecified: Secondary | ICD-10-CM

## 2021-11-09 DIAGNOSIS — I10 Essential (primary) hypertension: Secondary | ICD-10-CM

## 2021-11-09 DIAGNOSIS — Z23 Encounter for immunization: Secondary | ICD-10-CM

## 2021-11-09 DIAGNOSIS — E782 Mixed hyperlipidemia: Secondary | ICD-10-CM

## 2021-11-09 DIAGNOSIS — F1090 Alcohol use, unspecified, uncomplicated: Secondary | ICD-10-CM

## 2021-11-09 DIAGNOSIS — Z87898 Personal history of other specified conditions: Secondary | ICD-10-CM

## 2021-11-09 DIAGNOSIS — Z0000 Encounter for general adult medical examination without abnormal findings: Secondary | ICD-10-CM

## 2021-11-09 DIAGNOSIS — Z789 Other specified health status: Secondary | ICD-10-CM

## 2021-11-09 DIAGNOSIS — F17200 Nicotine dependence, unspecified, uncomplicated: Secondary | ICD-10-CM

## 2021-11-09 MED ORDER — ROSUVASTATIN CALCIUM 10 MG PO TABS
10.0000 mg | ORAL_TABLET | Freq: Every day | ORAL | 11 refills | Status: DC
  Filled 2021-11-09: qty 30, 30d supply, fill #0
  Filled 2022-01-11: qty 30, 30d supply, fill #1
  Filled 2022-02-23: qty 30, 30d supply, fill #2
  Filled 2022-03-23: qty 30, 30d supply, fill #3
  Filled 2022-04-26: qty 30, 30d supply, fill #4
  Filled 2022-05-31: qty 30, 30d supply, fill #5
  Filled 2022-06-29: qty 30, 30d supply, fill #6
  Filled 2022-07-27: qty 30, 30d supply, fill #7
  Filled 2022-09-05: qty 30, 30d supply, fill #8
  Filled 2022-10-10: qty 30, 30d supply, fill #9

## 2021-11-09 MED ORDER — AMLODIPINE BESYLATE 5 MG PO TABS
5.0000 mg | ORAL_TABLET | Freq: Every day | ORAL | 4 refills | Status: DC
  Filled 2021-11-09: qty 30, 30d supply, fill #0
  Filled 2022-01-11: qty 30, 30d supply, fill #1

## 2021-11-09 NOTE — Assessment & Plan Note (Signed)
Patient is blood pressure is 138/82 this clinic visit.  She reports that she was previously diagnosed with hypertension and was placed on a medication but she has not taken this medication in several years. -We will obtain BMP and start patient on amlodipine 5 mg once a day

## 2021-11-09 NOTE — Patient Instructions (Addendum)
Pleasure meeting you!   For your blood pressure, we will start you on a medication called amlodipine 5mg .   For your cholesterol we will also start you on crestor 10mg    Please let us know if you have any questions or concerns.

## 2021-11-09 NOTE — Assessment & Plan Note (Signed)
Lipid Panel     Component Value Date/Time   CHOL 232 (H) 10/26/2021 0953   TRIG 128 10/26/2021 0953   HDL 84 10/26/2021 0953   CHOLHDL 2.8 10/26/2021 0953   CHOLHDL 2.2 Ratio 08/26/2010 2029   VLDL 12 08/26/2010 2029   LDLCALC 126 (H) 10/26/2021 0953   LABVLDL 22 10/26/2021 0953   Patient's ASCVD score is 15.3%.  Discussed with patient that in addition to quitting smoking she should start a moderate intensity lipid-lowering medication to help prevent cardiovascular event.  She is in agreement with this plan and was started on rosuvastatin 10 mg daily.

## 2021-11-09 NOTE — Progress Notes (Signed)
° °  CC: establish care, referred from [facility]   HPI:  [name] is a 61 y.o. F with PMH per below who presents to establish care with our clinic. Please see problem based charting under encounters tab for further details.    Past Medical History:  Diagnosis Date   Anxiety    Childhood asthma    Depression    Fibroids    GERD (gastroesophageal reflux disease)    Headache    "monthly" (05/15/2017)   High cholesterol    Hypertension    Pneumonia    "twice when I was young" (05/15/2017)   PONV (postoperative nausea and vomiting)    in the past "used to get nauseous"   Seizures ([place]) last sz 04/2017   "I've had grand mal; usually hit the floor and wake up w/in a minute or so;last one was today, 05/15/2017"   Subdural hematoma 01/29/2013   Per MRI 01/2013; "had a seizure and busted my head open"   Review of Systems:  Please see problem based charting under encounters tab for further details.   Physical Exam:  Vitals:   11/09/21 0844 11/09/21 0850  BP: (!) 149/86 138/82  Pulse: 66 67  Temp: 98 F (36.7 C)   TempSrc: Oral   SpO2: 100%   Weight: 167 lb 9.6 oz (76 kg)   Height: 5\' 8"  (1.727 m)    Constitutional: well-developed, well-nourished, and in no distress.  HENT:  Head: Normocephalic and atraumatic.  Eyes: EOM are normal.  Neck: Normal range of motion.  Cardiovascular: Normal rate, regular rhythm, normal heart sounds and intact distal pulses. Exam reveals no gallop and no friction rub.  No murmur heard. Pulmonary: Effort normal and breath sounds normal. No respiratory distress.  Abdominal: Soft. Bowel sounds are normal.  Nontender, nondistended.  Musculoskeletal: Normal range of motion.        General: No tenderness or edema.  Neurological: He is alert and oriented to person, place, and time.  Skin: Skin is warm and dry.    Assessment & Plan:   See Encounters Tab for problem based charting.  Patient discussed with Dr. Dareen Piano

## 2021-11-09 NOTE — Assessment & Plan Note (Signed)
Patient continues to smoke about half a pack per day.  She states that she has done this for about the last 44 years. -She is currently not ready to quit smoking, but we agreed that we would address this at her next visit.

## 2021-11-09 NOTE — Assessment & Plan Note (Signed)
Patient reports that she used to drink heavily approximately 3 years ago.  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  She reports that currently she has a healthy relationship with alcohol and drinks about 2 beers every other day.  We discussed the importance of moderation respect to her alcohol use due to the many complications that can occur. She feels her drinking is controlled at this time.

## 2021-11-09 NOTE — Assessment & Plan Note (Signed)
Patient was last seen by neurology in 01/2018.  At that time, her antiepileptic zonisamide was increased to 3 times daily dosing.  Patient reports that she never did this. She states that having to stop driving and start working caused her to become depressed.  She felt that the seizures were related to trauma to her head and she subsequently stopped taking her medication at that time.  She states that her last seizure was approximately 3 years ago.  She is currently driving and working without any difficulty.  A/P: Discussed with patient that is imperative that she follow-up with neurology.  They may still recommend that patient continue antiseizure medication.  She is hesitant as she would not like to start working or driving, but I explained to patient that if her seizures are well controlled on medicines if needed then she can likely return to her activity.  She states that she will follow-up with neurology to see if it is okay if she has been off of her antiseizure medications.

## 2021-11-10 NOTE — Progress Notes (Signed)
Internal Medicine Clinic Attending  Case discussed with Dr. Carter  At the time of the visit.  We reviewed the resident's history and exam and pertinent patient test results.  I agree with the assessment, diagnosis, and plan of care documented in the resident's note.  

## 2021-11-11 LAB — BMP8+ANION GAP
Anion Gap: 16 mmol/L (ref 10.0–18.0)
BUN/Creatinine Ratio: 14 (ref 9–23)
BUN: 9 mg/dL (ref 6–24)
CO2: 21 mmol/L (ref 20–29)
Calcium: 8.6 mg/dL — ABNORMAL LOW (ref 8.7–10.2)
Chloride: 107 mmol/L — ABNORMAL HIGH (ref 96–106)
Creatinine, Ser: 0.64 mg/dL (ref 0.57–1.00)
Glucose: 80 mg/dL (ref 70–99)
Potassium: 4.2 mmol/L (ref 3.5–5.2)
Sodium: 144 mmol/L (ref 134–144)
eGFR: 102 mL/min/{1.73_m2} (ref >59)

## 2022-01-11 ENCOUNTER — Other Ambulatory Visit (HOSPITAL_COMMUNITY): Payer: Self-pay

## 2022-02-09 ENCOUNTER — Encounter: Payer: Self-pay | Admitting: Student

## 2022-02-09 ENCOUNTER — Other Ambulatory Visit (HOSPITAL_COMMUNITY): Payer: Self-pay

## 2022-02-09 ENCOUNTER — Ambulatory Visit: Payer: Self-pay | Admitting: Student

## 2022-02-09 VITALS — BP 148/88 | HR 71 | Temp 98.2°F | Ht 68.0 in | Wt 160.6 lb

## 2022-02-09 DIAGNOSIS — M545 Low back pain, unspecified: Secondary | ICD-10-CM

## 2022-02-09 DIAGNOSIS — G40909 Epilepsy, unspecified, not intractable, without status epilepticus: Secondary | ICD-10-CM

## 2022-02-09 DIAGNOSIS — Z0000 Encounter for general adult medical examination without abnormal findings: Secondary | ICD-10-CM

## 2022-02-09 DIAGNOSIS — I10 Essential (primary) hypertension: Secondary | ICD-10-CM

## 2022-02-09 MED ORDER — NAPROXEN 500 MG PO TABS
500.0000 mg | ORAL_TABLET | Freq: Two times a day (BID) | ORAL | 0 refills | Status: AC
  Filled 2022-02-09: qty 30, 15d supply, fill #0

## 2022-02-09 MED ORDER — AMLODIPINE BESYLATE 5 MG PO TABS
10.0000 mg | ORAL_TABLET | Freq: Every day | ORAL | 3 refills | Status: DC
  Filled 2022-02-09: qty 60, 30d supply, fill #0
  Filled 2022-03-23: qty 60, 30d supply, fill #1
  Filled 2022-04-26: qty 60, 30d supply, fill #2
  Filled 2022-05-28: qty 60, 30d supply, fill #3

## 2022-02-09 MED ORDER — ZOSTER VAC RECOMB ADJUVANTED 50 MCG/0.5ML IM SUSR: 0.5000 mL | Freq: Once | INTRAMUSCULAR | 0 refills | Status: AC

## 2022-02-09 NOTE — Patient Instructions (Addendum)
For your blood pressure we will have you to check it daily and keep a record of this. We will increase your blood pressure medicine to '10mg'$  daily at this time.  ? ? ?For your back pain, please use Naproxen (Aleve) '500mg'$  Twice a day (With breakfast and with dinner) for 2 weeks. IF your back pain has not improved, please schedule another appointment with our office.  ? ? ? ?[facility] ?[phone] ?

## 2022-02-14 DIAGNOSIS — M549 Dorsalgia, unspecified: Secondary | ICD-10-CM | POA: Insufficient documentation

## 2022-02-14 NOTE — Assessment & Plan Note (Signed)
Patient reports over the last 2 weeks she has developed R sided lower back pain that travels to the anterior thigh. 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 She experiences the pain most severely at nighttime. She has tried tylenol and ibuprofen intermittently to minimal degree.  ? ?On exam, she has a negative straight leg raise test, she has 5/5 BLE strength, denies numbness and tingling, no bowel or bladder incontinence. No midline TTP of spine. R lower back TTP just to the midline.  ? ?A/P: Patient most likely has a lumbar radicular pain given where the pain originates and travels. At this time she has no alarm features.  ? ?Also considered muscle spasms and concomitant meralgia paresthetica given her frequent bending.  ? ?-Discussed with patient to trial a 1-2 week course of ibuprofen with food to see if this helps her pain. Also provided patient with stretching exercises. Finally, she noted that she would try as much as possible to avoid exacerbating movements.  ?-Will hold on imaging at this time. Patient will follow up as needed if pain does not improve or worsens.  ?

## 2022-02-14 NOTE — Assessment & Plan Note (Signed)
Patient's blood pressure was elevated this clinic visit.  ? ?Today's Vitals  ? 02/09/22 1423 02/09/22 1448  ?BP: (!) 166/91 (!) 148/88  ?Pulse: 75 71  ?Temp: 98.2 ?F (36.8 ?C)   ?TempSrc: Oral   ?SpO2: 98%   ?Weight: 160 lb 9.6 oz (72.8 kg)   ?Height: '5\' 8"'$  (1.727 m)   ?PainSc: 8    ? ?Body mass index is 24.42 kg/m?.  ? ?Patient reports she continues to smoke cigarettes and that she has missed doses of her antihypertensive about two times out of the week. She states she sometimes forgets to take her medication when she is rushing out of her home to get to work.  ? ?She has a blood pressure cuff but has not been able to take her blood pressure daily.  ? ?A/P: Discussed with patient that her blood pressure is not at goal currently. We discussed the benefits smoking cessation could have on helping her blood pressure. We also discussed increasing her amlodipine to '10mg'$  daily. Patient is in agreement with this and also notes that she will start recording her blood pressures at least daily.  ?

## 2022-02-14 NOTE — Assessment & Plan Note (Signed)
Upon further chart review patient was again seen by neurology 07/2018. At that time she stated that she had had 2 seizure like episodes since 01/2018. The plan at that time was to continue her zonisamide at '300mg'$  daily and start lamictal. Patient was to follow up in four months. Unfortunately, she was not able to do this. She reiterates that she has not had seizure like (staring/zoning out) episodes since around 2020. Her insurance status has been a barrier to her scheduling an appointment with neurology again.  ? ?Will reach out to the neurology clinic she was previously following with to see if they could work with her current insurance status.  ?

## 2022-02-14 NOTE — Assessment & Plan Note (Signed)
Patient remains uninsured. She is in the process of applying for the orange card but states that she currently does not use a bank to deposit the money she earns from her job. This is a barrier to completing the orange card application.  ? ?She will follow up with the orange card administrators to see what other information she can provide.  ? ?Also provided patient with script for shingrix vaccine.  ?

## 2022-02-14 NOTE — Progress Notes (Signed)
? ?  CC: hypertension and back pain  ? ?HPI: ? ?[name] is a 61 y.o. F with PMH below who presents for follow up of her back pain and blood pressure. Please see problem based charting under encounters tab for further details.  ? ? ?Past Medical History:  ?Diagnosis Date  ? Anxiety   ? Childhood asthma   ? Depression   ? Fibroids   ? GERD (gastroesophageal reflux disease)   ? Headache   ? "monthly" (05/15/2017)  ? High cholesterol   ? Hypertension   ? Pneumonia   ? "twice when I was young" (05/15/2017)  ? PONV (postoperative nausea and vomiting)   ? in the past "used to get nauseous"  ? Seizures ([place]) last sz 04/2017  ? "I've had grand mal; usually hit the floor and wake up w/in a minute or so;last one was today, 05/15/2017"  ? Subdural hematoma ([place]) 01/29/2013  ? Per MRI 01/2013; "had a seizure and busted my head open"  ? ?Review of Systems:  Please see problem based charting under encounters tab for further details.  ? ? ?Physical Exam: ? ?Vitals:  ? 02/09/22 1423 02/09/22 1448  ?BP: (!) 166/91 (!) 148/88  ?Pulse: 75 71  ?Temp: 98.2 ?F (36.8 ?C)   ?TempSrc: Oral   ?SpO2: 98%   ?Weight: 160 lb 9.6 oz (72.8 kg)   ?Height: '5\' 8"'$  (1.727 m)   ? ? ?Constitutional: Well-developed, well-nourished, and in no distress.  ?HENT:  ?Head: Normocephalic and atraumatic.  ?Eyes: EOM are normal.  ?Neck: Normal range of motion.  ?Cardiovascular: Normal rate, regular rhythm, intact distal pulses. No gallop and no friction rub.  ?No murmur heard. No lower extremity edema  ?Pulmonary: Non labored breathing on room air, no wheezing or rales  ?Abdominal: Soft. Normal bowel sounds. Non distended and non tender ?Musculoskeletal: Normal range of motion. Midline back with no TTP. R lower back with TTP.  ?   General: No tenderness or edema.  ?Neurological: Alert and oriented to person, place, and time. Non focal. 5/5 BLE strength. Non antalgic gait.  ?Skin: Skin is warm and dry.  ? ? ?Assessment & Plan:  ? ?See Encounters Tab for  problem based charting. ? ?Patient discussed with Dr. Philipp Ovens ? ?

## 2022-02-16 NOTE — Progress Notes (Signed)
Internal Medicine Clinic Attending  Case discussed with Dr. Carter  At the time of the visit.  We reviewed the resident's history and exam and pertinent patient test results.  I agree with the assessment, diagnosis, and plan of care documented in the resident's note.  

## 2022-02-23 ENCOUNTER — Other Ambulatory Visit (HOSPITAL_COMMUNITY): Payer: Self-pay

## 2022-02-23 ENCOUNTER — Other Ambulatory Visit: Payer: Self-pay | Admitting: Student

## 2022-02-23 DIAGNOSIS — M545 Low back pain, unspecified: Secondary | ICD-10-CM

## 2022-02-24 ENCOUNTER — Other Ambulatory Visit (HOSPITAL_COMMUNITY): Payer: Self-pay

## 2022-03-24 ENCOUNTER — Other Ambulatory Visit (HOSPITAL_COMMUNITY): Payer: Self-pay

## 2022-04-26 ENCOUNTER — Other Ambulatory Visit (HOSPITAL_COMMUNITY): Payer: Self-pay

## 2022-04-27 ENCOUNTER — Other Ambulatory Visit (HOSPITAL_COMMUNITY): Payer: Self-pay

## 2022-05-29 ENCOUNTER — Other Ambulatory Visit (HOSPITAL_COMMUNITY): Payer: Self-pay

## 2022-05-31 ENCOUNTER — Other Ambulatory Visit (HOSPITAL_COMMUNITY): Payer: Self-pay

## 2022-05-31 ENCOUNTER — Ambulatory Visit: Payer: Self-pay | Admitting: Student

## 2022-05-31 ENCOUNTER — Encounter: Payer: Self-pay | Admitting: Student

## 2022-05-31 VITALS — BP 154/91 | HR 69 | Temp 98.3°F | Ht 68.0 in | Wt 149.3 lb

## 2022-05-31 DIAGNOSIS — I10 Essential (primary) hypertension: Secondary | ICD-10-CM

## 2022-05-31 DIAGNOSIS — R252 Cramp and spasm: Secondary | ICD-10-CM | POA: Insufficient documentation

## 2022-05-31 DIAGNOSIS — F17210 Nicotine dependence, cigarettes, uncomplicated: Secondary | ICD-10-CM

## 2022-05-31 MED ORDER — AMLODIPINE BESYLATE 10 MG PO TABS
10.0000 mg | ORAL_TABLET | Freq: Every day | ORAL | 11 refills | Status: DC
  Filled 2022-05-31: qty 30, 30d supply, fill #0
  Filled 2022-07-27: qty 30, 30d supply, fill #1
  Filled 2022-09-06: qty 30, 30d supply, fill #2
  Filled 2022-10-10: qty 30, 30d supply, fill #3
  Filled 2022-11-14: qty 30, 30d supply, fill #4
  Filled 2022-12-15: qty 30, 30d supply, fill #5
  Filled 2023-01-22: qty 30, 30d supply, fill #6
  Filled 2023-03-02: qty 30, 30d supply, fill #7
  Filled 2023-04-06: qty 30, 30d supply, fill #8
  Filled 2023-05-11 – 2023-05-14 (×2): qty 30, 30d supply, fill #9

## 2022-05-31 MED ORDER — LOSARTAN POTASSIUM 25 MG PO TABS
25.0000 mg | ORAL_TABLET | Freq: Every day | ORAL | 11 refills | Status: DC
  Filled 2022-05-31: qty 30, 30d supply, fill #0
  Filled 2022-06-29: qty 30, 30d supply, fill #1
  Filled 2022-07-27: qty 30, 30d supply, fill #2
  Filled 2022-09-06: qty 30, 30d supply, fill #3
  Filled 2022-10-10: qty 30, 30d supply, fill #4
  Filled 2022-11-14: qty 30, 30d supply, fill #5
  Filled 2022-12-15: qty 30, 30d supply, fill #6
  Filled 2023-01-22: qty 30, 30d supply, fill #7
  Filled 2023-03-02: qty 30, 30d supply, fill #8
  Filled 2023-04-06: qty 30, 30d supply, fill #9
  Filled 2023-05-11 – 2023-05-14 (×2): qty 30, 30d supply, fill #10

## 2022-05-31 NOTE — Assessment & Plan Note (Signed)
Patient reports intermittent cramping on both hands. 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 There is no pattern in the timing of the cramping and denies pain or swelling associated with it. Patient has not noticed if this cramping is associated with decreased hydration or longer work days. She reports only eating one meal a day with snacks throughout the day. Exam today with negative Chvostek sign, 2+ brachioradialis reflex, full ROM without pain or tenderness to palpation at MCP, PIP, DIP joints. No edema. Suspect this is likely in the setting of patient's demanding job with musculoskeletal strain vs dehydration. - Advised continued hydration, especially on hot days - Encouraged frequent snacks if unable to eat at work - Patient will be back in two weeks; will assess electrolytes at the time especially if still experiencing cramping

## 2022-05-31 NOTE — Assessment & Plan Note (Signed)
Vitals:   05/31/22 1047 05/31/22 1126  BP: (!) 147/85 (!) 154/91   Patient returns to clinic for HTN management follow up. Since last visit, patient with better antihypertensive compliance evidenced by her refill history and lower BP. However, patient is stil above goal. Currently taking amlodipine '5mg'$  BID. Patient denies HA, changes in vision, palpitations, CP, SOB, orthopnea, lower extremity edema, or DOE. Physical exam today without changes from prior or alarm signs. Patient would benefit of the addition of a second antihypertensive. Given bilateral hand muscle cramping (see problem) likely in the setting of dehydration and physical labor, do not favor HCTZ at this time. Will add ARB to her regimen and have patient come back in 2 weeks for BMP. - Continue Amlodipine, 10 mg once daily - Start Losartan 25 mg, once daily - Follow up in 2 weeks with BMP

## 2022-05-31 NOTE — Progress Notes (Signed)
Subjective:  CC: blood pressure  HPI:  [name] is a 61 y.o. female with a past medical history stated below and presents today for blood pressure. Please see problem based assessment and plan for additional details.  Past Medical History:  Diagnosis Date   Anxiety    Childhood asthma    Depression    Fibroids    GERD (gastroesophageal reflux disease)    Headache    "monthly" (05/15/2017)   High cholesterol    Hypertension    Pneumonia    "twice when I was young" (05/15/2017)   PONV (postoperative nausea and vomiting)    in the past "used to get nauseous"   Seizures ([place]) last sz 04/2017   "I've had grand mal; usually hit the floor and wake up w/in a minute or so;last one was today, 05/15/2017"   Subdural hematoma ([place]) 01/29/2013   Per MRI 01/2013; "had a seizure and busted my head open"    Current Outpatient Medications on File Prior to Visit  Medication Sig Dispense Refill   Multiple Vitamin (MULTIVITAMIN WITH MINERALS) TABS tablet Take 1 tablet by mouth daily. (Patient not taking: Reported on 10/06/2021) 30 tablet 0   rosuvastatin (CRESTOR) 10 MG tablet Take 1 tablet (10 mg total) by mouth daily. 30 tablet 11   zonisamide (ZONEGRAN) 100 MG capsule Take 4 capsules (400 mg total) by mouth daily. 4 caps daily (Patient not taking: Reported on 10/06/2021) 360 capsule 0   [DISCONTINUED] lamoTRIgine (LAMICTAL) 25 MG tablet Take 1 tablet (25 mg total) by mouth daily. 1 po daily for 2 weeks then increase to 1 twice daily (Patient not taking: Reported on 05/01/2019) 60 tablet 4   No current facility-administered medications on file prior to visit.    Family History  Problem Relation Age of Onset   Cirrhosis Mother    Diabetes Mellitus I Mother    Breast cancer Mother    Esophageal cancer Father    Hypertension Father    Breast cancer Sister    Cancer Sister    Heart attack Brother     Social History   Socioeconomic History   Marital status: Significant Other     Spouse name: Not on file   Number of children: 0   Years of education: HS   Highest education level: 12th grade  Occupational History   Occupation: Unemployed  Tobacco Use   Smoking status: Every Day    Packs/day: 0.50    Years: 41.00    Total pack years: 20.50    Types: Cigarettes   Smokeless tobacco: Never  Vaping Use   Vaping Use: Every day   Substances: Nicotine, Flavoring  Substance and Sexual Activity   Alcohol use: Yes    Alcohol/week: 6.0 standard drinks of alcohol    Types: 6 Cans of beer per week    Comment: occ.   Drug use: No   Sexual activity: Not Currently    Birth control/protection: None, Surgical  Other Topics Concern   Not on file  Social History Narrative   Lives at home with boyfriend.   Right-handed.   1 cup caffeine per day.   Social Determinants of Health   Financial Resource Strain: Not on file  Food Insecurity: No Food Insecurity (10/26/2021)   Hunger Vital Sign    Worried About Running Out of Food in the Last Year: Never true    Ran Out of Food in the Last Year: Never true  Transportation Needs: No Transportation Needs (  10/26/2021)   PRAPARE - Hydrologist (Medical): No    Lack of Transportation (Non-Medical): No  Physical Activity: Unknown (09/20/2017)   Exercise Vital Sign    Days of Exercise per Week: Patient refused    Minutes of Exercise per Session: Patient refused  Stress: Not on file  Social Connections: Unknown (09/20/2017)   Social Connection and Isolation Panel [NHANES]    Frequency of Communication with Friends and Family: Patient refused    Frequency of Social Gatherings with Friends and Family: Patient refused    Attends Religious Services: Patient refused    Active Member of Clubs or Organizations: Patient refused    Attends Archivist Meetings: Patient refused    Marital Status: Patient refused  Intimate Partner Violence: Unknown (09/20/2017)   Humiliation, Afraid, Rape, and Kick  questionnaire    Fear of Current or Ex-Partner: Patient refused    Emotionally Abused: Patient refused    Physically Abused: Patient refused    Sexually Abused: Patient refused    Review of Systems: ROS negative except for what is noted on the assessment and plan.  Objective:   Vitals:   05/31/22 1047 05/31/22 1126  BP: (!) 147/85 (!) 154/91  Pulse: 77 69  Temp: 98.3 F (36.8 C)   TempSrc: Oral   SpO2: 100%   Weight: 149 lb 4.8 oz (67.7 kg)   Height: '5\' 8"'$  (1.727 m)     Physical Exam: Constitutional: well-appearing person sitting in examination chair, in no acute distress HENT: normocephalic atraumatic, mucous membranes moist Eyes: conjunctiva non-erythematous Neck: supple Cardiovascular: regular rate and rhythm, no m/r/g Pulmonary/Chest: normal work of breathing on room air, lungs clear to auscultation bilaterally Abdominal: soft, non-tender, non-distended MSK: normal bulk and tone Neurological: alert & oriented x 3, 5/5 strength in bilateral upper and lower extremities, normal gait. negative Chvostek sign, 2+ brachioradialis reflex, full ROM without pain or tenderness to palpation at MCP, PIP, DIP joints. No edema.  Skin: warm and dry Psych: appropriate mood and affect     Assessment & Plan:   HYPERTENSION, BENIGN ESSENTIAL Vitals:   05/31/22 1047 05/31/22 1126  BP: (!) 147/85 (!) 154/91   Patient returns to clinic for HTN management follow up. Since last visit, patient with better antihypertensive compliance evidenced by her refill history and lower BP. However, patient is stil above goal. Currently taking amlodipine '5mg'$  BID. Patient denies HA, changes in vision, palpitations, CP, SOB, orthopnea, lower extremity edema, or DOE. Physical exam today without changes from prior or alarm signs. Patient would benefit of the addition of a second antihypertensive. Given bilateral hand muscle cramping (see problem) likely in the setting of dehydration and physical labor, do not  favor HCTZ at this time. Will add ARB to her regimen and have patient come back in 2 weeks for BMP. - Continue Amlodipine, 10 mg once daily - Start Losartan 25 mg, once daily - Follow up in 2 weeks with BMP  Cramping of hands Patient reports intermittent cramping on both hands. 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 There is no pattern in the timing of the cramping and denies pain or swelling associated with it. Patient has not noticed if this cramping is associated with decreased hydration or longer work days. She reports only eating one meal a day with snacks throughout the day. Exam today with negative Chvostek sign, 2+ brachioradialis reflex, full ROM without pain or  tenderness to palpation at MCP, PIP, DIP joints. No edema. Suspect this is likely in the setting of patient's demanding job with musculoskeletal strain vs dehydration. - Advised continued hydration, especially on hot days - Encouraged frequent snacks if unable to eat at work - Patient will be back in two weeks; will assess electrolytes at the time especially if still experiencing cramping  Patient seen with Dr. Dareen Piano

## 2022-05-31 NOTE — Patient Instructions (Signed)
Thank you, Cynthia Welch for allowing [place] to provide your care today. Today we discussed:  BLOOD PRESSURE Congratulations on taking your medication; it is improving and it shows the work the work you are putting in. We still think you would benefit from another medication because we want your blood pressures to be <130/80. So - Will continue with the amlodipine, but instead of taking 5 mg in the morning and 5 mg at night, we want you to take ONE 10 mg tablet once a day.  - We are adding a second medication Losartan. Take ONE 25 mg tablet once a day.  - Continue monitoring your blood pressures at home. - Please bring your blood pressure cuff next visit - We will see you in two weeks to do labs and monitor you  HAND CRAMPING   We think this is likely because you are dehydrated. Keep up you water intake, especially on hot days when you are working. Your work is really physically demanding, so remember to eat small meals and keep drinking water throughout the day. If your cramping gets worse, let us know at your follow up visit or before if it becomes a problem for you.  [email or web address]  Please follow-up in in 2 weeks.  Please make sure to arrive 15 minutes prior to your next appointment. If you arrive late, you may be asked to reschedule.    We look forward to seeing you next time. Please call our clinic at [phone] if you have any questions or concerns. The best time to call is Monday-Friday from 9am-4pm, but there is someone available 24/7. If after hours or the weekend, call the main hospital number and ask for the Internal Medicine Resident On-Call. If you need medication refills, please notify your pharmacy one week in advance and they will send [place] a request.   Thank you for letting us take part in your care. Wishing you the best!  Romana Juniper, MD 05/31/2022, 11:38 AM [facility] Internal Medicine Resident, PGY-1

## 2022-06-01 NOTE — Progress Notes (Signed)
Internal Medicine Clinic Attending  I saw and evaluated the patient.  I personally confirmed the key portions of the history and exam documented by Dr. Gomez-Caraballo and I reviewed pertinent patient test results.  The assessment, diagnosis, and plan were formulated together and I agree with the documentation in the resident's note.  

## 2022-06-14 ENCOUNTER — Encounter: Payer: Self-pay | Admitting: Student

## 2022-06-14 ENCOUNTER — Other Ambulatory Visit: Payer: Self-pay

## 2022-06-14 ENCOUNTER — Ambulatory Visit: Payer: Self-pay | Admitting: Student

## 2022-06-14 VITALS — BP 140/82 | HR 63 | Temp 97.6°F | Ht 68.0 in | Wt 150.2 lb

## 2022-06-14 DIAGNOSIS — I10 Essential (primary) hypertension: Secondary | ICD-10-CM

## 2022-06-14 DIAGNOSIS — Z0000 Encounter for general adult medical examination without abnormal findings: Secondary | ICD-10-CM

## 2022-06-14 DIAGNOSIS — F17210 Nicotine dependence, cigarettes, uncomplicated: Secondary | ICD-10-CM

## 2022-06-14 DIAGNOSIS — F17200 Nicotine dependence, unspecified, uncomplicated: Secondary | ICD-10-CM

## 2022-06-14 DIAGNOSIS — Z23 Encounter for immunization: Secondary | ICD-10-CM

## 2022-06-14 DIAGNOSIS — E7800 Pure hypercholesterolemia, unspecified: Secondary | ICD-10-CM

## 2022-06-14 NOTE — Patient Instructions (Signed)
Thank you, [name] for allowing [place] to provide your care today. Today we discussed your blood pressure. There is some improvement but there is room for more. We wont change your medications today. We will check your kidney function to see how your kidneys are adapting to the new medication. We are also checking you cholesterol to see how well your cholesterol medication is working.  As we spoke, you would really benefit from quitting smoking for stroke and cardiac event prevention. It is okay to start the nicotine gum now to help with cravings. You can get this over the counter at the drug store. If this fails, we should talk about a medication that helps people with quitting and craving.    I have ordered the following labs for you:   Lab Orders         BMP8+Anion Gap         Lipid Profile      I will call if any are abnormal. All of your labs can be accessed through "My Chart".  My Chart Access: [email or web address]  Please follow-up in in 4 weeks.  Please make sure to arrive 15 minutes prior to your next appointment. If you arrive late, you may be asked to reschedule.    We look forward to seeing you next time. Please call our clinic at [phone] if you have any questions or concerns. The best time to call is Monday-Friday from 9am-4pm, but there is someone available 24/7. If after hours or the weekend, call the main hospital number and ask for the Internal Medicine Resident On-Call. If you need medication refills, please notify your pharmacy one week in advance and they will send [place] a request.   Thank you for letting us take part in your care. Wishing you the best!  Cynthia Juniper, MD 06/14/2022, 10:20 AM Cynthia Welch Internal Medicine Resident, PGY-1

## 2022-06-14 NOTE — Assessment & Plan Note (Signed)
Vitals:   06/14/22 0948 06/14/22 0952  BP: (!) 149/85 (!) 140/82  Patient with history of Uncontrolled hypertension on amlodipine 10 mg, recently seen in clinic on 05/31/2022 when she was started on Losartan 25 mg. Patient present in clinic today for blood pressure check and medication monitoring. Home blood pressure: ?in upper 130s/80, patient brought BP cuff to clinic and has similar readings to in office checks. Patient denies hypotensive episodes, or HA, changes in vision, palpitations, chest pain, sob, doe, lower extremity edema. She continues to report a balanced, low sodium diet and active life style. Patient continues to smoke 4-5 cigarettes after work. Explained that this is also contributing to her high BP; patient expressed understanding.   BP not at goal but home readings are closer to goal with addition of Losartan 2 weeks ago. Will not change dose today but encouraged patient to continue with previously established changes in life style modifications consider quitting smoking.   Plan -BMP today to assess kidney function -Return in one month for HTN follow up to consider changes in therapy

## 2022-06-14 NOTE — Assessment & Plan Note (Addendum)
Tdap today

## 2022-06-14 NOTE — Progress Notes (Signed)
Subjective:  CC: blood pressure follow up  HPI:  [name] is a 61 y.o. female with a past medical history stated below and presents today for blood pressure follow up. Please see problem based assessment and plan for additional details.  Past Medical History:  Diagnosis Date   Anxiety    Childhood asthma    Depression    Fibroids    GERD (gastroesophageal reflux disease)    Headache    "monthly" (05/15/2017)   High cholesterol    Hypertension    Pneumonia    "twice when I was young" (05/15/2017)   PONV (postoperative nausea and vomiting)    in the past "used to get nauseous"   S/P TAH (total abdominal hysterectomy) 09/07/2015   Seizures ([place]) last sz 04/2017   "I've had grand mal; usually hit the floor and wake up w/in a minute or so;last one was today, 05/15/2017"   Subdural hematoma ([place]) 01/29/2013   Per MRI 01/2013; "had a seizure and busted my head open"    Current Outpatient Medications on File Prior to Visit  Medication Sig Dispense Refill   amLODipine (NORVASC) 10 MG tablet Take 1 tablet (10 mg total) by mouth daily. 30 tablet 11   losartan (COZAAR) 25 MG tablet Take 1 tablet (25 mg total) by mouth daily. 30 tablet 11   Multiple Vitamin (MULTIVITAMIN WITH MINERALS) TABS tablet Take 1 tablet by mouth daily. (Patient not taking: Reported on 10/06/2021) 30 tablet 0   rosuvastatin (CRESTOR) 10 MG tablet Take 1 tablet (10 mg total) by mouth daily. 30 tablet 11   zonisamide (ZONEGRAN) 100 MG capsule Take 4 capsules (400 mg total) by mouth daily. 4 caps daily (Patient not taking: Reported on 10/06/2021) 360 capsule 0   [DISCONTINUED] lamoTRIgine (LAMICTAL) 25 MG tablet Take 1 tablet (25 mg total) by mouth daily. 1 po daily for 2 weeks then increase to 1 twice daily (Patient not taking: Reported on 05/01/2019) 60 tablet 4   No current facility-administered medications on file prior to visit.    Family History  Problem Relation Age of Onset   Cirrhosis Mother     Diabetes Mellitus I Mother    Breast cancer Mother    Esophageal cancer Father    Hypertension Father    Breast cancer Sister    Cancer Sister    Heart attack Brother     Social History   Socioeconomic History   Marital status: Significant Other    Spouse name: Not on file   Number of children: 0   Years of education: HS   Highest education level: 12th grade  Occupational History   Occupation: Unemployed  Tobacco Use   Smoking status: Every Day    Packs/day: 0.50    Years: 41.00    Total pack years: 20.50    Types: Cigarettes   Smokeless tobacco: Never  Vaping Use   Vaping Use: Every day   Substances: Nicotine, Flavoring  Substance and Sexual Activity   Alcohol use: Yes    Alcohol/week: 6.0 standard drinks of alcohol    Types: 6 Cans of beer per week    Comment: occ.   Drug use: No   Sexual activity: Not Currently    Birth control/protection: None, Surgical  Other Topics Concern   Not on file  Social History Narrative   Lives at home with boyfriend.   Right-handed.   1 cup caffeine per day.   Social Determinants of Radio broadcast assistant  Strain: Not on file  Food Insecurity: No Food Insecurity (10/26/2021)   Hunger Vital Sign    Worried About Running Out of Food in the Last Year: Never true    Ran Out of Food in the Last Year: Never true  Transportation Needs: No Transportation Needs (10/26/2021)   PRAPARE - Hydrologist (Medical): No    Lack of Transportation (Non-Medical): No  Physical Activity: Unknown (09/20/2017)   Exercise Vital Sign    Days of Exercise per Week: Patient refused    Minutes of Exercise per Session: Patient refused  Stress: Not on file  Social Connections: Unknown (09/20/2017)   Social Connection and Isolation Panel [NHANES]    Frequency of Communication with Friends and Family: Patient refused    Frequency of Social Gatherings with Friends and Family: Patient refused    Attends Religious Services:  Patient refused    Active Member of Clubs or Organizations: Patient refused    Attends Archivist Meetings: Patient refused    Marital Status: Patient refused  Intimate Partner Violence: Unknown (09/20/2017)   Humiliation, Afraid, Rape, and Kick questionnaire    Fear of Current or Ex-Partner: Patient refused    Emotionally Abused: Patient refused    Physically Abused: Patient refused    Sexually Abused: Patient refused    Review of Systems: ROS negative except for what is noted on the assessment and plan.  Objective:   Vitals:   06/14/22 0948 06/14/22 0952  BP: (!) 149/85 (!) 140/82  Pulse: 63   Temp: 97.6 F (36.4 C)   TempSrc: Oral   SpO2: 100%   Weight: 150 lb 3.2 oz (68.1 kg)   Height: '5\' 8"'$  (1.727 m)     Physical Exam: Constitutional: well-appearing woman sitting in exam chair, in no acute distress HENT: normocephalic atraumatic, mucous membranes moist Eyes: conjunctiva non-erythematous Cardiovascular: regular rate and rhythm, no m/r/g Pulmonary/Chest: normal work of breathing on room air, lungs clear to auscultation bilaterally Abdominal: soft, non-tender, non-distended MSK: normal bulk and tone Neurological: alert & oriented x 3, 5/5 strength in bilateral upper and lower extremities, normal gait Skin: warm and dry Psych: Pleasant mood and appropriate affect     Assessment & Plan:   Hypertension Vitals:   06/14/22 0948 06/14/22 0952  BP: (!) 149/85 (!) 140/82  Patient with history of Uncontrolled hypertension on amlodipine 10 mg, recently seen in clinic on 05/31/2022 when she was started on Losartan 25 mg. Patient present in clinic today for blood pressure check and medication monitoring. Home blood pressure: in upper 130s/80, patient brought BP cuff to clinic and has similar readings to in office checks. Patient denies hypotensive episodes, or HA, changes in vision, palpitations, chest pain, sob, doe, lower extremity edema. She continues to report a  balanced, low sodium diet and active life style. Patient continues to smoke 4-5 cigarettes after work. Explained that this is also contributing to her high BP; patient expressed understanding.   BP not at goal but home readings are closer to goal with addition of Losartan 2 weeks ago. Will not change dose today but encouraged patient to continue with previously established changes in life style modifications consider quitting smoking.   Plan -BMP today to assess kidney function -Return in one month for HTN follow up to consider changes in therapy  HYPERCHOLESTEROLEMIA Patient on moderate intensity statin Crestor 10 mg since 10/2021; however, patient did not start taking therapy until 01/2022. Marland Kitchen Last HLD then  total cholesterol  232, LDL 126. ASCVD 18.1%. Patient continues to smoke. Now with desire to try nicotin gum before trying more medications. Patient has been compliant as evidenced by her medication refill practices. Will repeat lipid panel today to assess. -repeat lipid panel and reassess  -Continue Crestor 10 mg daily   Healthcare maintenance Tdap today   Patient seen with Dr. Evette Doffing

## 2022-06-15 LAB — BMP8+ANION GAP
Anion Gap: 18 mmol/L (ref 10.0–18.0)
BUN/Creatinine Ratio: 16 (ref 9–23)
BUN: 9 mg/dL (ref 6–24)
CO2: 23 mmol/L (ref 20–29)
Calcium: 9.4 mg/dL (ref 8.7–10.2)
Chloride: 101 mmol/L (ref 96–106)
Creatinine, Ser: 0.55 mg/dL — ABNORMAL LOW (ref 0.57–1.00)
Glucose: 95 mg/dL (ref 70–99)
Potassium: 3.4 mmol/L — ABNORMAL LOW (ref 3.5–5.2)
Sodium: 142 mmol/L (ref 134–144)
eGFR: 106 mL/min/{1.73_m2} (ref >59)

## 2022-06-15 LAB — LIPID PANEL
Chol/HDL Ratio: 1.9 ratio (ref 0.0–4.4)
Cholesterol, Total: 186 mg/dL (ref 100–199)
HDL: 97 mg/dL (ref >39)
LDL Chol Calc (NIH): 68 mg/dL (ref 0–99)
Triglycerides: 126 mg/dL (ref 0–149)
VLDL Cholesterol Cal: 21 mg/dL (ref 5–40)

## 2022-06-15 NOTE — Progress Notes (Signed)
Internal Medicine Clinic Attending  I saw and evaluated the patient.  I personally confirmed the key portions of the history and exam documented by Dr. Simeon Craft and I reviewed pertinent patient test results.  The assessment, diagnosis, and plan were formulated together and I agree with the documentation in the resident's note.

## 2022-06-15 NOTE — Assessment & Plan Note (Signed)
Patient on moderate intensity statin Crestor 10 mg since 10/2021; however, patient did not start taking therapy until 01/2022. Marland Kitchen Last HLD then  total cholesterol 232, LDL 126. ASCVD 18.1%. Patient continues to smoke. Now with desire to try nicotin gum before trying more medications. Patient has been compliant as evidenced by her medication refill practices. Will repeat lipid panel today to assess. -repeat lipid panel and reassess  -Continue Crestor 10 mg daily

## 2022-06-29 ENCOUNTER — Other Ambulatory Visit (HOSPITAL_COMMUNITY): Payer: Self-pay

## 2022-06-30 ENCOUNTER — Other Ambulatory Visit (HOSPITAL_COMMUNITY): Payer: Self-pay

## 2022-07-11 NOTE — Progress Notes (Unsigned)
Subjective:  Reason for visit: Follow-up of hypertension  HPI:  Cynthia Welch is a 61 y.o. female with history of hypertension who presents for follow-up of same. Please see problem based assessment and plan for additional details.  Past Medical History:  Diagnosis Date   Anxiety    Childhood asthma    Depression    Fibroids    GERD (gastroesophageal reflux disease)    Headache    "monthly" (05/15/2017)   High cholesterol    Hypertension    Pneumonia    "twice when I was young" (05/15/2017)   PONV (postoperative nausea and vomiting)    in the past "used to get nauseous"   S/P TAH (total abdominal hysterectomy) 09/07/2015   Seizures ([place]) last sz 04/2017   "I've had grand mal; usually hit the floor and wake up w/in a minute or so;last one was today, 05/15/2017"   Subdural hematoma ([place]) 01/29/2013   Per MRI 01/2013; "had a seizure and busted my head open"    Current Outpatient Medications on File Prior to Visit  Medication Sig Dispense Refill   amLODipine (NORVASC) 10 MG tablet Take 1 tablet (10 mg total) by mouth daily. 30 tablet 11   losartan (COZAAR) 25 MG tablet Take 1 tablet (25 mg total) by mouth daily. 30 tablet 11   Multiple Vitamin (MULTIVITAMIN WITH MINERALS) TABS tablet Take 1 tablet by mouth daily. 30 tablet 0   rosuvastatin (CRESTOR) 10 MG tablet Take 1 tablet (10 mg total) by mouth daily. 30 tablet 11   No current facility-administered medications on file prior to visit.    Family History  Problem Relation Age of Onset   Cirrhosis Mother    Diabetes Mellitus I Mother    Breast cancer Mother    Esophageal cancer Father    Hypertension Father    Breast cancer Sister    Cancer Sister    Heart attack Brother     Social History   Socioeconomic History   Marital status: Significant Other    Spouse name: Not on file   Number of children: 0   Years of education: HS   Highest education level: 12th grade  Occupational History   Occupation:  Unemployed  Tobacco Use   Smoking status: Every Day    Packs/day: 0.50    Years: 41.00    Total pack years: 20.50    Types: Cigarettes   Smokeless tobacco: Never  Vaping Use   Vaping Use: Every day   Substances: Nicotine, Flavoring  Substance and Sexual Activity   Alcohol use: Yes    Alcohol/week: 6.0 standard drinks of alcohol    Types: 6 Cans of beer per week    Comment: occ.   Drug use: No   Sexual activity: Not Currently    Birth control/protection: None, Surgical  Other Topics Concern   Not on file  Social History Narrative   Lives at home with boyfriend.   Right-handed.   1 cup caffeine per day.   Social Determinants of Health   Financial Resource Strain: Not on file  Food Insecurity: No Food Insecurity (10/26/2021)   Hunger Vital Sign    Worried About Running Out of Food in the Last Year: Never true    Ran Out of Food in the Last Year: Never true  Transportation Needs: No Transportation Needs (10/26/2021)   PRAPARE - Hydrologist (Medical): No    Lack of Transportation (Non-Medical): No  Physical Activity:  Unknown (09/20/2017)   Exercise Vital Sign    Days of Exercise per Week: Patient refused    Minutes of Exercise per Session: Patient refused  Stress: Not on file  Social Connections: Unknown (09/20/2017)   Social Connection and Isolation Panel [NHANES]    Frequency of Communication with Friends and Family: Patient refused    Frequency of Social Gatherings with Friends and Family: Patient refused    Attends Religious Services: Patient refused    Active Member of Clubs or Organizations: Patient refused    Attends Archivist Meetings: Patient refused    Marital Status: Patient refused  Intimate Partner Violence: Unknown (09/20/2017)   Humiliation, Afraid, Rape, and Kick questionnaire    Fear of Current or Ex-Partner: Patient refused    Emotionally Abused: Patient refused    Physically Abused: Patient refused    Sexually  Abused: Patient refused    Review of Systems: ROS negative except for what is noted on the assessment and plan.  Objective:   Vitals:   07/12/22 0843  BP: 126/81  Pulse: 83  Temp: 98.1 F (36.7 C)  TempSrc: Oral  SpO2: 100%  Weight: 147 lb 12.8 oz (67 kg)  Height: '5\' 8"'$  (1.727 m)    Physical Exam Constitutional:      General: She is not in acute distress.    Appearance: Normal appearance.  Cardiovascular:     Rate and Rhythm: Normal rate and regular rhythm.     Pulses: Normal pulses.  Pulmonary:     Effort: Pulmonary effort is normal.     Breath sounds: Normal breath sounds. No stridor.  Musculoskeletal:     Right lower leg: No edema.     Left lower leg: No edema.  Lymphadenopathy:     Cervical: No cervical adenopathy.  Skin:    General: Skin is warm and dry.  Neurological:     Mental Status: She is alert. Mental status is at baseline.  Psychiatric:        Mood and Affect: Mood normal.        Behavior: Behavior normal.       Assessment & Plan:  Hypertension 126/81. Under good control today. Taking medicine after dinner which helps her remember her pills.  Doing well with blood pressure logging at home.  No changes.  Tobacco use disorder Down from 1 pack/day to 4 cigarettes/day.  Has replaced some of her cigarette smoking with vaping.  Motivated to quit.  Nicotine gum does not work.  Has not tried patches.  Recommend nicotine replacement therapy with patches.  We will follow-up in 6 months.  If she has not had success in stopping completely, I recommend a trial of Chantix, which she was interested in learning more about today.  Healthcare maintenance Flu vaccine administered today.   Return in about 6 months (around 01/10/2023) for Routine follow up, and smoking cessation..   Patient seen with Dr. Archie Endo, M.D. [facility]  PGY-1 Pager: [phone] Date 07/12/2022  Time 9:29 AM

## 2022-07-12 ENCOUNTER — Ambulatory Visit: Payer: Self-pay | Admitting: Student

## 2022-07-12 VITALS — BP 126/81 | HR 83 | Temp 98.1°F | Ht 68.0 in | Wt 147.8 lb

## 2022-07-12 DIAGNOSIS — I10 Essential (primary) hypertension: Secondary | ICD-10-CM

## 2022-07-12 DIAGNOSIS — Z23 Encounter for immunization: Secondary | ICD-10-CM

## 2022-07-12 DIAGNOSIS — F17209 Nicotine dependence, unspecified, with unspecified nicotine-induced disorders: Secondary | ICD-10-CM

## 2022-07-12 DIAGNOSIS — F17210 Nicotine dependence, cigarettes, uncomplicated: Secondary | ICD-10-CM

## 2022-07-12 DIAGNOSIS — F17200 Nicotine dependence, unspecified, uncomplicated: Secondary | ICD-10-CM

## 2022-07-12 DIAGNOSIS — Z0000 Encounter for general adult medical examination without abnormal findings: Secondary | ICD-10-CM

## 2022-07-12 DIAGNOSIS — G40909 Epilepsy, unspecified, not intractable, without status epilepticus: Secondary | ICD-10-CM

## 2022-07-12 NOTE — Assessment & Plan Note (Addendum)
Down from 1 pack/day to 4 cigarettes/day.  Has replaced some of her cigarette smoking with vaping.  Motivated to quit.  Nicotine gum does not work.  Has not tried patches.  Recommend nicotine replacement therapy with patches.  We will follow-up in 6 months.  If she has not had success in stopping completely, I recommend a trial of Chantix, which she was interested in learning more about today.

## 2022-07-12 NOTE — Patient Instructions (Addendum)
Today we discussed high blood pressure and smoking cessation.  You're doing great! Keep up the good work. Continue checking your blood pressure at home.  For smoking cessation, try nicotine patches. At your next visit, we'll check in with you about this.  Return to the clinic in 6 months for a follow-up visit.    Please call our clinic at [phone] Monday through Friday from 9 am to 4 pm if you have questions or concerns about your health. If after hours or on the weekend, call the main hospital number and ask for the Internal Medicine Resident On-Call. If you need medication refills, please notify your pharmacy one week in advance and they will send [place] a request.   Best, Nani Gasser, [place]

## 2022-07-12 NOTE — Assessment & Plan Note (Addendum)
Flu vaccine administered today. Screening mammogram ordered, due Oct 07, 2023.

## 2022-07-12 NOTE — Addendum Note (Signed)
Addended by: Nani Gasser on: 07/12/2022 10:31 AM   Modules accepted: Orders

## 2022-07-12 NOTE — Assessment & Plan Note (Addendum)
126/81. Under good control today. Taking medicine after dinner which helps her remember her pills.  Doing well with blood pressure logging at home.  -Continue amlodipine 10 mg and losartan 25 mg

## 2022-07-13 NOTE — Progress Notes (Signed)
Internal Medicine Clinic Attending  I saw and evaluated the patient.  I personally confirmed the key portions of the history and exam documented by the resident  and I reviewed pertinent patient test results.  The assessment, diagnosis, and plan were formulated together and I agree with the documentation in the resident's note.  

## 2022-07-27 ENCOUNTER — Other Ambulatory Visit (HOSPITAL_COMMUNITY): Payer: Self-pay

## 2022-09-06 ENCOUNTER — Other Ambulatory Visit (HOSPITAL_COMMUNITY): Payer: Self-pay

## 2022-10-10 ENCOUNTER — Other Ambulatory Visit (HOSPITAL_COMMUNITY): Payer: Self-pay

## 2022-10-12 ENCOUNTER — Ambulatory Visit
Admission: RE | Admit: 2022-10-12 | Discharge: 2022-10-12 | Disposition: A | Discharge status: Home / Self Care | Payer: No Typology Code available for payment source | Source: Ambulatory Visit | Attending: Internal Medicine | Admitting: Internal Medicine

## 2022-10-12 DIAGNOSIS — Z0000 Encounter for general adult medical examination without abnormal findings: Secondary | ICD-10-CM

## 2022-11-14 ENCOUNTER — Other Ambulatory Visit: Payer: Self-pay | Admitting: Student

## 2022-11-14 ENCOUNTER — Other Ambulatory Visit (HOSPITAL_COMMUNITY): Payer: Self-pay

## 2022-11-14 ENCOUNTER — Encounter: Payer: Self-pay | Admitting: Internal Medicine

## 2022-11-14 ENCOUNTER — Ambulatory Visit: Payer: Self-pay | Admitting: Internal Medicine

## 2022-11-14 VITALS — BP 139/79 | HR 68 | Temp 97.8°F | Ht 68.0 in | Wt 149.8 lb

## 2022-11-14 DIAGNOSIS — I10 Essential (primary) hypertension: Secondary | ICD-10-CM

## 2022-11-14 DIAGNOSIS — M792 Neuralgia and neuritis, unspecified: Secondary | ICD-10-CM | POA: Insufficient documentation

## 2022-11-14 DIAGNOSIS — Z0000 Encounter for general adult medical examination without abnormal findings: Secondary | ICD-10-CM

## 2022-11-14 DIAGNOSIS — E782 Mixed hyperlipidemia: Secondary | ICD-10-CM

## 2022-11-14 DIAGNOSIS — R252 Cramp and spasm: Secondary | ICD-10-CM

## 2022-11-14 DIAGNOSIS — F17210 Nicotine dependence, cigarettes, uncomplicated: Secondary | ICD-10-CM

## 2022-11-14 MED ORDER — ROSUVASTATIN CALCIUM 10 MG PO TABS
10.0000 mg | ORAL_TABLET | Freq: Every day | ORAL | 11 refills | Status: DC
  Filled 2022-11-14: qty 30, 30d supply, fill #0
  Filled 2022-12-15: qty 30, 30d supply, fill #1
  Filled 2023-01-22: qty 30, 30d supply, fill #2
  Filled 2023-03-02: qty 30, 30d supply, fill #3
  Filled 2023-04-06: qty 30, 30d supply, fill #4
  Filled 2023-05-11 – 2023-05-14 (×2): qty 30, 30d supply, fill #5
  Filled 2023-06-20: qty 30, 30d supply, fill #6
  Filled 2023-08-23: qty 30, 30d supply, fill #7
  Filled 2023-10-23: qty 30, 30d supply, fill #8

## 2022-11-14 MED ORDER — DICLOFENAC SODIUM 1 % EX GEL
4.0000 g | Freq: Four times a day (QID) | CUTANEOUS | 1 refills | Status: AC
  Filled 2022-11-14: qty 100, fill #0

## 2022-11-14 MED ORDER — ZIKS ARTHRITIS PAIN RELIEF 0.025-1-12 % EX CREA
1.0000 | TOPICAL_CREAM | Freq: Four times a day (QID) | CUTANEOUS | 2 refills | Status: AC | PRN
  Filled 2022-11-14: qty 56.6, fill #0

## 2022-11-14 NOTE — Assessment & Plan Note (Signed)
Patient reports ongoing cramping/spasms of hypothenar muscles bilaterally and thenar muscles of the right. She has complained of this problem previously. 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 Since then, she has focused on staying hydrated. She continues to work same job. Spasms unfortunately persist, now longer in duration and more severe in pain. She has been sitting on her hands to break spasms. She notes that at the end of the day, when spasms are worse, she will be unable to use hands for tasks such as opening jars. She does have chronic swelling and dull ache in right hand from old wrist fracture requiring ORIF, but has not noticed any erythema, warmth, or other signs of inflammation in wrists or hands bilaterally. She does not have pain unless spasms. No numbness or tingling. Spasms have been present since before she initiated statin, but have worsened in the past 5 months. Wrist splints/braces, advil, ice, heat, massages, and topical creams have been ineffective.   On exam, the hands appear without obvious deformity. No ulnar deviation. Minimal non-pitting edema of right 2nd MCP joint. ROM normal. No edema or erythema. Strength and sensation intact. No atrophy of intrinsic hand muscles. Negative tinnels sign. No pain with palpation to joints. Does have some muscle spasm when hypothenar muscles palpated right hand.   This seems most consistent with spasm secondary to overuse of muscle groups. Will check bmp today to rule out electrolyte abnormalities. Hypothyroidism can occasionally mimic CTS so will check TSH as well; presentation is not consistent with CTS otherwise so will not obtain any further workup for this. If she does develop signs concerning for CTS, would obtain US of wrists since she cannot have MRI done due to surgical hardware. Spasms in such isolated muscle groups is not consistent with any other concerning neurologic presentation either so will not obtain  imaging of C-spine at this time either. This has been an issue since before starting a statin, so do not believe this is the causative agent. Presentation does not seem consistent with arthritic pain either, though this may certainly be exacerbating her condition. She has tried most therapy for this condition with little effect. Unfortunately, we do not have much more to offer her and do not believe MSK relaxer would be appropriate for her condition either. Recommended continued tylenol, heat, splinting, and voltaren gel.

## 2022-11-14 NOTE — Progress Notes (Signed)
   CC: hand pain  HPI:Cynthia Welch is a 62 y.o. female who presents for evaluation of hand cramps. Please see individual problem based A/P for details.   Depression, PHQ-9: Based on the patients  [facility] Visit from 02/09/2022 in [facility]  PHQ-9 Total Score 0      score we have .  Past Medical History:  Diagnosis Date   Anxiety    Childhood asthma    Depression    Fibroids    GERD (gastroesophageal reflux disease)    Headache    "monthly" (05/15/2017)   High cholesterol    Hypertension    Pneumonia    "twice when I was young" (05/15/2017)   PONV (postoperative nausea and vomiting)    in the past "used to get nauseous"   S/P TAH (total abdominal hysterectomy) 09/07/2015   Seizures ([place]) last sz 04/2017   "I've had grand mal; usually hit the floor and wake up w/in a minute or so;last one was today, 05/15/2017"   Subdural hematoma ([place]) 01/29/2013   Per MRI 01/2013; "had a seizure and busted my head open"   Review of Systems:   See HPI  Physical Exam: Vitals:   11/14/22 0911 11/14/22 0947  BP: (!) 147/76 139/79  Pulse: 76 68  Temp: 97.8 F (36.6 C)   TempSrc: Oral   SpO2: 100%   Weight: 149 lb 12.8 oz (67.9 kg)   Height: '5\' 8"'$  (1.727 m)    General: nad HEENT: Conjunctiva nl , antiicteric sclerae, moist mucous membranes, no exudate or erythema Cardiovascular: Normal rate, regular rhythm.  No murmurs, rubs, or gallops Pulmonary : Equal breath sounds, No wheezes, rales, or rhonchi Abdominal: soft, nontender,  bowel sounds present Ext: No edema in lower extremities, no tenderness to palpation of lower extremities.  On exam, the hands appear without obvious deformity. No ulnar deviation. Minimal non-pitting edema of right 2nd MCP joint. ROM normal. No edema or erythema. Strength and sensation intact. No atrophy of intrinsic hand muscles. Negative tinnels sign. No pain with palpation to joints. Does have some muscle spasm when  hypothenar muscles palpated right hand.   Assessment & Plan:   See Encounters Tab for problem based charting.  Patient discussed with Dr.  Cain Sieve

## 2022-11-14 NOTE — Assessment & Plan Note (Signed)
Patient reports burning pain in forearm in distribution of hr old ORIF. Pain is intermittent and severe when she has it.   Will trial capsaicin cream for neuropathic pain.

## 2022-11-14 NOTE — Patient Instructions (Signed)
Dear Mrs. Merkey,  Thank you for trusting [place] with your care today.   We discussed your hand cramping. Your hand spasms do sound to be related to frequent use. We will check some labs today to make sure we are not missing anything. Unfortunately, there are not many great treatment options other than those that you have already tried. I do recommend splinting your wrists as much as you are able to, using the topical voltaren gel up to 4 times daily, applying heat to relax the muscles and using Advil for the arthritis pain/ache.   For the burning pain in your arm, this is likely related to nerve damage from the old fracture. I recommend using a topical capsaicin cream to help with this.   Please return in 3 months to follow up the pain.

## 2022-11-15 NOTE — Progress Notes (Signed)
Internal Medicine Clinic Attending ° °Case discussed with Dr. Gawaluck  At the time of the visit.  We reviewed the resident’s history and exam and pertinent patient test results.  I agree with the assessment, diagnosis, and plan of care documented in the resident’s note.  °

## 2022-11-16 LAB — BMP8+ANION GAP
Anion Gap: 17 mmol/L (ref 10.0–18.0)
BUN/Creatinine Ratio: 16 (ref 9–23)
BUN: 8 mg/dL (ref 6–24)
CO2: 21 mmol/L (ref 20–29)
Calcium: 9.2 mg/dL (ref 8.7–10.2)
Chloride: 102 mmol/L (ref 96–106)
Creatinine, Ser: 0.51 mg/dL — ABNORMAL LOW (ref 0.57–1.00)
Glucose: 94 mg/dL (ref 70–99)
Potassium: 3.7 mmol/L (ref 3.5–5.2)
Sodium: 140 mmol/L (ref 134–144)
eGFR: 107 mL/min/{1.73_m2} (ref >59)

## 2022-11-16 LAB — TSH: TSH: 0.972 u[IU]/mL (ref 0.450–4.500)

## 2022-11-20 ENCOUNTER — Encounter: Payer: Self-pay | Admitting: Internal Medicine

## 2022-11-20 NOTE — Progress Notes (Signed)
No reversible causes identified on BMP to explain ongoing spasms in hands. BMP unremarkable.

## 2022-12-15 ENCOUNTER — Other Ambulatory Visit (HOSPITAL_COMMUNITY): Payer: Self-pay

## 2023-01-22 ENCOUNTER — Other Ambulatory Visit (HOSPITAL_COMMUNITY): Payer: Self-pay

## 2023-01-22 DIAGNOSIS — Z419 Encounter for procedure for purposes other than remedying health state, unspecified: Secondary | ICD-10-CM | POA: Diagnosis not present

## 2023-02-21 DIAGNOSIS — Z419 Encounter for procedure for purposes other than remedying health state, unspecified: Secondary | ICD-10-CM | POA: Diagnosis not present

## 2023-03-02 ENCOUNTER — Other Ambulatory Visit (HOSPITAL_COMMUNITY): Payer: Self-pay

## 2023-03-13 ENCOUNTER — Ambulatory Visit: Payer: Medicaid Other | Admitting: Student

## 2023-03-13 VITALS — BP 127/81 | HR 85 | Temp 98.6°F | Ht 68.0 in | Wt 144.3 lb

## 2023-03-13 DIAGNOSIS — I10 Essential (primary) hypertension: Secondary | ICD-10-CM

## 2023-03-13 DIAGNOSIS — R252 Cramp and spasm: Secondary | ICD-10-CM | POA: Diagnosis not present

## 2023-03-13 DIAGNOSIS — E876 Hypokalemia: Secondary | ICD-10-CM

## 2023-03-13 NOTE — Progress Notes (Signed)
Subjective:   [name] female   DOB: [birth year] 62 y.o.   MRN: [id number]  HPI: [name] is a 62 y.o. female with a PMHx as presented below who presents for follow up on bilateral hand and leg cramping as well as follow up on her hypertension. Please see problem based assessment and plan for further work up and management.   Patient Active Problem List   Diagnosis Date Noted   Neuropathic pain of forearm, right 11/14/2022   Spasms of the hands or feet 05/31/2022   Back pain 02/14/2022   Healthcare maintenance 05/01/2019   Macrocytosis without anemia 06/05/2017   Esophageal reflux 10/05/2016   Seizure disorder ([facility]) 08/10/2010   Tobacco use disorder 11/19/2008   HYPERCHOLESTEROLEMIA 10/31/2007   Hypertension 10/30/2007     Current Outpatient Medications  Medication Sig Dispense Refill   amLODipine (NORVASC) 10 MG tablet Take 1 tablet (10 mg total) by mouth daily. 30 tablet 11   Capsaicin-Menthol-Methyl Sal (CAPSAICIN-METHYL SAL-MENTHOL) 0.025-1-12 % CREA Apply 1 Application topically 4 (four) times daily as needed. 56.6 g 2   diclofenac Sodium (VOLTAREN ARTHRITIS PAIN) 1 % GEL Apply 4 g topically 4 (four) times daily. 100 g 1   losartan (COZAAR) 25 MG tablet Take 1 tablet (25 mg total) by mouth daily. 30 tablet 11   Multiple Vitamin (MULTIVITAMIN WITH MINERALS) TABS tablet Take 1 tablet by mouth daily. 30 tablet 0   rosuvastatin (CRESTOR) 10 MG tablet Take 1 tablet (10 mg total) by mouth daily. 30 tablet 11   No current facility-administered medications for this visit.     Review of Systems: Pertinent ROS listed in the A&P. Otherwise negative  Objective:   Physical Exam: Vitals:   03/13/23 1050  BP: 127/81  Pulse: 85  Temp: 98.6 F (37 C)  TempSrc: Oral  SpO2: 100%  Weight: 144 lb 4.8 oz (65.5 kg)  Height: 5\' 8"  (1.727 m)   Physical Exam Constitutional:      Appearance: Normal appearance.  HENT:     Head: Normocephalic and  atraumatic.  Cardiovascular:     Rate and Rhythm: Normal rate and regular rhythm.  Pulmonary:     Effort: Pulmonary effort is normal.     Breath sounds: Normal breath sounds.  Skin:    General: Skin is warm and dry.  Neurological:     General: No focal deficit present.     Mental Status: She is alert.     Comments: Reduced strength with finger abduction against resistance bilaterally. No overlying joint deformity of the hands.No hand tenderness. Sensation is intact in the bilateral hands. 5/5 strength in the bilateral LE with intact sensation.   Psychiatric:        Mood and Affect: Mood normal.        Behavior: Behavior normal.      Assessment & Plan:   Spasms of the hands or feet Patient is here for follow up regarding cramping of her bilateral hands and feet. She states the hand cramping began about 1 year ago and lasts majority of the day. Symptoms are triggered by use of the hands. States she experiences an ulnar deviation when her hands begin to spasm. Also feels her hand have gotten progressively weaker since symptoms began. She now has difficulty writing and grasping. She denies paresthesias of the hands. She has tried Voltaren gel with no relief. Patient also complaining cramping in her lateral legs. States symptoms have been going on for  years but has gotten worse over the last year. Cramping is brought on with stretching, bending, or crossing the legs. Symptoms are worse at night. She has been trying Voltaren gel and heating pads which provide some relief. She denies any paresthesias of the legs, but does have pins and needles sensation of the bilateral feet. States it "feels like jelly," when she attempts to walk. Exam reveals weakness against resistance with abduction of the fingers. There are no overlying skin changes or joint deformities. The hands are not tender to palpation. Sensation is intact. She has 5/5 strength of the bilateral LE with intact sensation. Patient was seen for  the same at her last visit when she had normal TSH and BMP. Unclear etiology. Will repeat BMP today and obtain Mg and B12. Additionally, her description is concerning for peripheral neuropathy so will obtain A1c. If studies are negative, may consider EMG and never conduction studies as a next step.  > B12 > Mg > A1c > BMP  Hypertension Blood pressure well controlled today at 127/81. Patient is adherent on her amlodipine 10 mg and losartan 25 mg. She denies any adverse effects. Will continue with current regimen.   > amlodipine 10 mg > Losartan 25 mg

## 2023-03-13 NOTE — Patient Instructions (Signed)
We will check labs and phone visit in 2 weeks to see if your symptoms have improved.

## 2023-03-13 NOTE — Assessment & Plan Note (Signed)
Patient is here for follow up regarding cramping of her bilateral hands and feet. She states the hand cramping began about 1 year ago and lasts majority of the day. Symptoms are triggered by use of the hands. States she experiences an ulnar deviation when her hands begin to spasm. Also feels her hand have gotten progressively weaker since symptoms began. She now has difficulty writing and grasping. She denies paresthesias of the hands. She has tried Voltaren gel with no relief. Patient also complaining cramping in her lateral legs. States symptoms have been going on for years but has gotten worse over the last year. Cramping is brought on with stretching, bending, or crossing the legs. Symptoms are worse at night. She has been trying Voltaren gel and heating pads which provide some relief. She denies any paresthesias of the legs, but does have pins and needles sensation of the bilateral feet. States it "feels like jelly," when she attempts to walk. Exam reveals weakness against resistance with abduction of the fingers. There are no overlying skin changes or joint deformities. The hands are not tender to palpation. Sensation is intact. She has 5/5 strength of the bilateral LE with intact sensation. Patient was seen for the same at her last visit when she had normal TSH and BMP. Unclear etiology. Will repeat BMP today and obtain Mg and B12. Additionally, her description is concerning for peripheral neuropathy so will obtain A1c. If studies are negative, may consider EMG and never conduction studies as a next step.  > B12 > Mg > A1c > BMP

## 2023-03-13 NOTE — Assessment & Plan Note (Signed)
Blood pressure well controlled today at 127/81. Patient is adherent on her amlodipine 10 mg and losartan 25 mg. She denies any adverse effects. Will continue with current regimen.   > amlodipine 10 mg > Losartan 25 mg

## 2023-03-15 ENCOUNTER — Other Ambulatory Visit (INDEPENDENT_AMBULATORY_CARE_PROVIDER_SITE_OTHER): Payer: Medicaid Other

## 2023-03-15 DIAGNOSIS — R252 Cramp and spasm: Secondary | ICD-10-CM

## 2023-03-16 ENCOUNTER — Other Ambulatory Visit: Payer: Self-pay | Admitting: Student

## 2023-03-16 DIAGNOSIS — E876 Hypokalemia: Secondary | ICD-10-CM

## 2023-03-16 LAB — VITAMIN B12: Vitamin B-12: 1102 pg/mL (ref 232–1245)

## 2023-03-16 LAB — BMP8+ANION GAP
Anion Gap: 17 mmol/L (ref 10.0–18.0)
BUN/Creatinine Ratio: 13 (ref 9–23)
BUN: 7 mg/dL (ref 6–24)
CO2: 22 mmol/L (ref 20–29)
Calcium: 9 mg/dL (ref 8.7–10.2)
Chloride: 101 mmol/L (ref 96–106)
Creatinine, Ser: 0.54 mg/dL — ABNORMAL LOW (ref 0.57–1.00)
Glucose: 101 mg/dL — ABNORMAL HIGH (ref 70–99)
Potassium: 3.2 mmol/L — ABNORMAL LOW (ref 3.5–5.2)
Sodium: 140 mmol/L (ref 134–144)
eGFR: 106 mL/min/{1.73_m2} (ref >59)

## 2023-03-16 LAB — MAGNESIUM: Magnesium: 2 mg/dL (ref 1.6–2.3)

## 2023-03-16 LAB — HEMOGLOBIN A1C
Est. average glucose Bld gHb Est-mCnc: 120 mg/dL
Hgb A1c MFr Bld: 5.8 % — ABNORMAL HIGH (ref 4.8–5.6)

## 2023-03-16 MED ORDER — POTASSIUM CHLORIDE CRYS ER 20 MEQ PO TBCR
20.0000 meq | EXTENDED_RELEASE_TABLET | ORAL | 0 refills | Status: DC
Start: 2023-03-16 — End: 2023-03-27

## 2023-03-17 NOTE — Assessment & Plan Note (Signed)
Patient noted to have hypokalemia this clinic visit. Sent a prescription for potassium supplementation. The etiology of this is unclear. This is in the setting of RAAS inhibitor losartan. Patient states that she has been told in the past that she has had hypokalemia. She is not currently on any medications that could be leading to this. She is not having diarrhea. Her magnesium level is within normal limits this clinic visit.   Plan:  Patient will come in next week for repeat BMP Will check urine lytes to help further elucidate mechanism of her low potassium.

## 2023-03-20 NOTE — Progress Notes (Signed)
Internal Medicine Clinic Attending  Case discussed with Dr. Carter  At the time of the visit.  We reviewed the resident's history and exam and pertinent patient test results.  I agree with the assessment, diagnosis, and plan of care documented in the resident's note.  

## 2023-03-22 ENCOUNTER — Telehealth: Payer: Self-pay | Admitting: Student

## 2023-03-22 NOTE — Telephone Encounter (Signed)
-----   Message from Cynthia Haller, MD sent at 03/21/2023  2:08 PM EDT ----- Would it be possible to get this patient scheduled for an appointment this week to recheck her potassium and further work up her low potassium?  Thank you!

## 2023-03-22 NOTE — Telephone Encounter (Signed)
Please refer to message below.  Attempted to contact patient at the request of Dr. Montez Morita. No answer left detailed message to call us back to schedule an appointment.

## 2023-03-23 ENCOUNTER — Ambulatory Visit: Payer: Medicaid Other

## 2023-03-23 VITALS — BP 127/77 | HR 84

## 2023-03-23 DIAGNOSIS — G252 Other specified forms of tremor: Secondary | ICD-10-CM

## 2023-03-23 DIAGNOSIS — I10 Essential (primary) hypertension: Secondary | ICD-10-CM | POA: Diagnosis not present

## 2023-03-23 DIAGNOSIS — R252 Cramp and spasm: Secondary | ICD-10-CM

## 2023-03-23 DIAGNOSIS — E876 Hypokalemia: Secondary | ICD-10-CM

## 2023-03-23 DIAGNOSIS — Z122 Encounter for screening for malignant neoplasm of respiratory organs: Secondary | ICD-10-CM

## 2023-03-23 DIAGNOSIS — Z1211 Encounter for screening for malignant neoplasm of colon: Secondary | ICD-10-CM

## 2023-03-23 DIAGNOSIS — Z0000 Encounter for general adult medical examination without abnormal findings: Secondary | ICD-10-CM

## 2023-03-23 NOTE — Assessment & Plan Note (Signed)
Recently assessed for hand and foot spasms. Hand spasms are the most prominent and R>L. Describes them as carpopedal spasms primarily around the 4th/5th digits with numbness of the hand at the same location and the ulnar aspect of the fore arm. Endorses weakness also. At last visit had hand weakness on exam, not tested during this visit as it provokes symptoms and patient was distressed about symptoms.

## 2023-03-23 NOTE — Assessment & Plan Note (Signed)
Blood pressure well controlled at 127/77 on amlodipine 10mg  and losartan 25 mg. Patient has been having mild hypokalemia and an option would be to increase losartan. Given a well controlled blood pressure I do not want to change her regimen at this time.

## 2023-03-23 NOTE — Assessment & Plan Note (Signed)
Patient is following up for hypokalemia to 3.2 recently. In the past has been noted to be undetectable. She says she doesn't have much oral intake secondary to loss of interest in food. She drinks occasional boost supplementation and will snack but overall limited intake. Dietary insufficiency may be contributing. Denies significant N/V or diarrhea to point towards GI losses. Has bad hot flashes, says she has severe sweating episodes 2-3 times daily. I do not think this is the etiology but could be contributory. Not on any thiazides, loops.Recent BMP with normal level of bicarb and calcium. Also magnesium has been normal. Will get urine potassium and creatinine to evaluate for any renal losses from a barter or gittleman syndrome. Will also get a urine pH to evaluate for RTA, would be most consistent with type 2 given normal bicarb.Will not get renin:aldo labs as she is on losartan and potassium should be 3.9 or higher to get these. Will get BMP and continue every other day potassium if normal as she has been doing this the past week. Can increase supplementation if not.

## 2023-03-23 NOTE — Patient Instructions (Signed)
Thank you, Cynthia Welch for allowing [place] to provide your care today. Today we discussed :  Hand spasms and tremor: Sending you to neurology for evaluation. Please watch for a call to schedule with them.   Low potassium: Continue your supplementation. We will recheck potassium today and continue supplementation if needed.  Health care maintenance: I have ordered a fecal occult blood for oclon cancer screening and CT chest for lung cancer screening.    I have ordered the following labs for you:   Lab Orders         Fecal occult blood, imunochemical         Potassium, Urine         Creatinine, Urine         BMP8+Anion Gap         pH, Urine        Referrals ordered today:    Referral Orders         Ambulatory referral to Neurology      I have ordered the following medication/changed the following medications:   Stop the following medications: There are no discontinued medications.   Start the following medications: No orders of the defined types were placed in this encounter.    Follow up: 2-3 months     We look forward to seeing you next time. Please call our clinic at [phone] if you have any questions or concerns. The best time to call is Monday-Friday from 9am-4pm, but there is someone available 24/7. If after hours or the weekend, call the main hospital number and ask for the Internal Medicine Resident On-Call. If you need medication refills, please notify your pharmacy one week in advance and they will send [place] a request.   Thank you for trusting me with your care. Wishing you the best!   Willette Cluster, MD [facility]

## 2023-03-23 NOTE — Progress Notes (Unsigned)
HTN Amlodipine 10 Losartan 25 BMP 02/2023 with potassium 3.2 A1c 02/2023 5.8  HLD LDL 68 05/2022 Crestor 10mg   Hypokalemia Mag wnl, no acidosis or alkalosis, potassium wnl Urine spot potassium and creatinine Renin:aldo can't be assessed until potassium 3.9 or higher Potassium 43m Eq every other day for 7 days  RTA vs barter or gittleman vs GI loss vs mineralocorticoid excess Repeat bmp  HCM FOBT CT lung cancer screen

## 2023-03-24 DIAGNOSIS — Z419 Encounter for procedure for purposes other than remedying health state, unspecified: Secondary | ICD-10-CM | POA: Diagnosis not present

## 2023-03-24 LAB — BMP8+ANION GAP
Anion Gap: 15 mmol/L (ref 10.0–18.0)
BUN/Creatinine Ratio: 13 (ref 9–23)
BUN: 7 mg/dL (ref 6–24)
CO2: 21 mmol/L (ref 20–29)
Calcium: 9 mg/dL (ref 8.7–10.2)
Chloride: 103 mmol/L (ref 96–106)
Creatinine, Ser: 0.54 mg/dL — ABNORMAL LOW (ref 0.57–1.00)
Glucose: 109 mg/dL — ABNORMAL HIGH (ref 70–99)
Potassium: 3.7 mmol/L (ref 3.5–5.2)
Sodium: 139 mmol/L (ref 134–144)
eGFR: 106 mL/min/{1.73_m2} (ref >59)

## 2023-03-26 ENCOUNTER — Encounter: Payer: Self-pay | Admitting: *Deleted

## 2023-03-27 ENCOUNTER — Other Ambulatory Visit: Payer: Self-pay

## 2023-03-27 ENCOUNTER — Other Ambulatory Visit (HOSPITAL_COMMUNITY): Payer: Self-pay

## 2023-03-27 DIAGNOSIS — Z1211 Encounter for screening for malignant neoplasm of colon: Secondary | ICD-10-CM | POA: Diagnosis not present

## 2023-03-27 MED ORDER — POTASSIUM CHLORIDE CRYS ER 20 MEQ PO TBCR
20.0000 meq | EXTENDED_RELEASE_TABLET | ORAL | 0 refills | Status: DC
  Filled 2023-03-27: qty 30, 60d supply, fill #0

## 2023-03-27 NOTE — Progress Notes (Signed)
Established Patient Office Visit  Subjective   [name], female    DOB: [birth year]  Age: 62 y.o. MRN: [id number]  No chief complaint on file.   Ms. Crescenzo is a 62 y/o female with a pmh outlined below. Please see A&P for HPI information.      Review of Systems  All other systems reviewed and are negative.     Objective:     BP 127/77 (BP Location: Right Arm, Patient Position: Sitting, Cuff Size: Normal)   Pulse 84   LMP 07/12/2015 (Approximate)    Physical Exam Constitutional:      General: She is not in acute distress.    Appearance: Normal appearance. She is normal weight. She is not ill-appearing or diaphoretic.  Cardiovascular:     Rate and Rhythm: Normal rate and regular rhythm.     Pulses: Normal pulses.     Heart sounds: Normal heart sounds. No murmur heard.    No friction rub. No gallop.  Pulmonary:     Effort: Pulmonary effort is normal. No respiratory distress.     Breath sounds: Normal breath sounds. No wheezing or rales.  Musculoskeletal:     Right lower leg: No edema.     Left lower leg: No edema.  Skin:    General: Skin is warm and dry.  Neurological:     Mental Status: She is alert.     Comments: Patient with bilateral resting tremor at rest, persistent but not worsened with posture and action. No cogwheel rigidity. Some hand clumsiness with rapid alternating movements, otherwise no abnormality in finger to nose, heel to shin or gait testing No other focal neuro deficits. Bilateral had strength testing deferred due to patient discomfort.      Results for orders placed or performed in visit on 03/23/23  BMP8+Anion Gap  Result Value Ref Range   Glucose 109 (H) 70 - 99 mg/dL   BUN 7 6 - 24 mg/dL   Creatinine, Ser 4.09 (L) 0.57 - 1.00 mg/dL   eGFR 811 >91 YN/WGN/5.62   BUN/Creatinine Ratio 13 9 - 23   Sodium 139 134 - 144 mmol/L   Potassium 3.7 3.5 - 5.2 mmol/L   Chloride 103 96 - 106 mmol/L   CO2 21 20 - 29 mmol/L    Anion Gap 15.0 10.0 - 18.0 mmol/L   Calcium 9.0 8.7 - 10.2 mg/dL      The 13-YQMV ASCVD risk score (Arnett DK, et al., 2019) is: 7.9%    Assessment & Plan:   Problem List Items Addressed This Visit       Cardiovascular and Mediastinum   Hypertension    Blood pressure well controlled at 127/77 on amlodipine 10mg  and losartan 25 mg. Patient has been having mild hypokalemia and an option would be to increase losartan. Given a well controlled blood pressure I do not want to change her regimen at this time.        Other   Hypokalemia - Primary    Patient is following up for hypokalemia to 3.2 recently. In the past has been noted to be undetectable. She says she doesn't have much oral intake secondary to loss of interest in food. She drinks occasional boost supplementation and will snack but overall limited intake. Dietary insufficiency may be contributing. Denies significant N/V or diarrhea to point towards GI losses. Has bad hot flashes, says she has severe sweating episodes 2-3 times daily. I do not think this is the etiology but  could be contributory. Not on any thiazides, loops.Recent BMP with normal level of bicarb and calcium. Also magnesium has been normal. Will get urine potassium and creatinine to evaluate for any renal losses from a barter or gittleman syndrome. Will also get a urine pH to evaluate for RTA, would be most consistent with type 2 given normal bicarb.Will not get renin:aldo labs as she is on losartan and potassium should be 3.9 or higher to get these. BMP this visit showed potassium of 3.7. Will continue every other day potassium as she has been doing this the past week. Do not want to adjust losartan given that her blood pressure is well controlled. Patient was unable to give urine sample, will come in for lab only visit 6/5.      Relevant Orders   BMP8+Anion Gap (Completed)   pH, Urine   Creatinine, Urine   Potassium, Urine   Healthcare maintenance    FOBT  ordered for annual colon cancer screening, CT lung cancer screening also ordered.      Spasms of the hands or feet    Recently assessed for hand and foot spasms. Hand spasms are the most prominent and R>L. Describes them as carpopedal spasms primarily around the 4th/5th digits with numbness of the hand at the same location and the ulnar aspect of the fore arm. Endorses weakness also. At last visit had hand weakness on exam, not tested during this visit as it provokes symptoms and patient was distressed about symptoms. No atrophy noted on exam. Does have some clumsiness with rapid alternating movements but no gait abnormalities, no finger to nose abnormalities, no heel to shin abnormalities to suggest primary cerebellar dysfunction. I am not sure if these spasms represent fasciculations but it would be odd to have bilateral lower motor neuron lesions. Additional she has bilateral non-pill rolling resting hand tremor that is persistent with posturing and movement but not worsened. No cogwheel rigidity or bradykinesia. I am curious if the tremor and the spasms are related. Referred to neurology for possible EMG and evaluation of resting tremor.      Relevant Orders   Ambulatory referral to Neurology   Other Visit Diagnoses     Resting tremor       Relevant Orders   Ambulatory referral to Neurology   Colon cancer screening       Relevant Orders   Fecal occult blood, imunochemical   Encounter for screening for lung cancer       Relevant Orders   CT CHEST LUNG CA SCREEN LOW DOSE W/O CM       No follow-ups on file.    Willette Cluster, MD

## 2023-03-27 NOTE — Assessment & Plan Note (Signed)
FOBT ordered for annual colon cancer screening, CT lung cancer screening also ordered.

## 2023-03-27 NOTE — Addendum Note (Signed)
Addended by: Willette Cluster on: 03/27/2023 07:39 AM   Modules accepted: Orders

## 2023-03-28 ENCOUNTER — Other Ambulatory Visit: Payer: Medicaid Other

## 2023-03-28 DIAGNOSIS — E876 Hypokalemia: Secondary | ICD-10-CM

## 2023-03-28 DIAGNOSIS — Z1211 Encounter for screening for malignant neoplasm of colon: Secondary | ICD-10-CM

## 2023-03-28 NOTE — Progress Notes (Signed)
Internal Medicine Clinic Attending  Case discussed with Dr. Rogers  At the time of the visit.  We reviewed the resident's history and exam and pertinent patient test results.  I agree with the assessment, diagnosis, and plan of care documented in the resident's note.  

## 2023-03-29 LAB — POTASSIUM, URINE, RANDOM: Potassium Urine: 12.1 mmol/L

## 2023-03-29 LAB — PH, URINE: pH, Urine: 5.7 (ref 4.5–8.0)

## 2023-03-29 LAB — CREATININE, URINE, RANDOM: Creatinine, Urine: 30.3 mg/dL

## 2023-03-29 LAB — FECAL OCCULT BLOOD, IMMUNOCHEMICAL: Fecal Occult Bld: NEGATIVE

## 2023-04-03 ENCOUNTER — Ambulatory Visit: Payer: Medicaid Other | Admitting: Student

## 2023-04-03 ENCOUNTER — Encounter: Payer: Self-pay | Admitting: Student

## 2023-04-03 ENCOUNTER — Ambulatory Visit (HOSPITAL_COMMUNITY)
Admission: RE | Admit: 2023-04-03 | Discharge: 2023-04-03 | Disposition: A | Discharge status: Home / Self Care | Payer: Medicaid Other | Source: Ambulatory Visit | Attending: Internal Medicine | Admitting: Internal Medicine

## 2023-04-03 VITALS — BP 136/94 | HR 114 | Temp 98.9°F | Ht 68.0 in | Wt 140.6 lb

## 2023-04-03 DIAGNOSIS — E876 Hypokalemia: Secondary | ICD-10-CM | POA: Diagnosis not present

## 2023-04-03 DIAGNOSIS — R609 Edema, unspecified: Secondary | ICD-10-CM | POA: Diagnosis not present

## 2023-04-03 DIAGNOSIS — M25571 Pain in right ankle and joints of right foot: Secondary | ICD-10-CM

## 2023-04-03 DIAGNOSIS — R252 Cramp and spasm: Secondary | ICD-10-CM | POA: Diagnosis not present

## 2023-04-03 DIAGNOSIS — M79671 Pain in right foot: Secondary | ICD-10-CM | POA: Diagnosis not present

## 2023-04-03 DIAGNOSIS — R251 Tremor, unspecified: Secondary | ICD-10-CM | POA: Diagnosis not present

## 2023-04-03 NOTE — Progress Notes (Signed)
Subjective:  CC: fall  HPI:  Ms.Janautica Meline is a 62 y.o. female with a past medical history stated below and presents today for fall and tremor. Please see problem based assessment and plan for additional details.  Past Medical History:  Diagnosis Date   Anxiety    Childhood asthma    Depression    Fibroids    GERD (gastroesophageal reflux disease)    Headache    "monthly" (05/15/2017)   High cholesterol    Hypertension    Pneumonia    "twice when I was young" (05/15/2017)   PONV (postoperative nausea and vomiting)    in the past "used to get nauseous"   S/P TAH (total abdominal hysterectomy) 09/07/2015   Seizures (HCC) last sz 04/2017   "I've had grand mal; usually hit the floor and wake up w/in a minute or so;last one was today, 05/15/2017"   Subdural hematoma (HCC) 01/29/2013   Per MRI 01/2013; "had a seizure and busted my head open"    Current Outpatient Medications on File Prior to Visit  Medication Sig Dispense Refill   amLODipine (NORVASC) 10 MG tablet Take 1 tablet (10 mg total) by mouth daily. 30 tablet 11   Capsaicin-Menthol-Methyl Sal (CAPSAICIN-METHYL SAL-MENTHOL) 0.025-1-12 % CREA Apply 1 Application topically 4 (four) times daily as needed. 56.6 g 2   diclofenac Sodium (VOLTAREN ARTHRITIS PAIN) 1 % GEL Apply 4 g topically 4 (four) times daily. 100 g 1   losartan (COZAAR) 25 MG tablet Take 1 tablet (25 mg total) by mouth daily. 30 tablet 11   Multiple Vitamin (MULTIVITAMIN WITH MINERALS) TABS tablet Take 1 tablet by mouth daily. 30 tablet 0   potassium chloride SA (KLOR-CON M) 20 MEQ tablet Take 1 tablet (20 mEq total) by mouth every other day. 30 tablet 0   rosuvastatin (CRESTOR) 10 MG tablet Take 1 tablet (10 mg total) by mouth daily. 30 tablet 11   No current facility-administered medications on file prior to visit.    Family History  Problem Relation Age of Onset   Cirrhosis Mother    Diabetes Mellitus I Mother    Breast cancer Mother     Esophageal cancer Father    Hypertension Father    Breast cancer Sister    Cancer Sister    Heart attack Brother     Social History   Socioeconomic History   Marital status: Significant Other    Spouse name: Not on file   Number of children: 0   Years of education: HS   Highest education level: 12th grade  Occupational History   Occupation: Unemployed  Tobacco Use   Smoking status: Every Day    Packs/day: 0.50    Years: 41.00    Additional pack years: 0.00    Total pack years: 20.50    Types: Cigarettes   Smokeless tobacco: Never  Vaping Use   Vaping Use: Every day   Substances: Nicotine, Flavoring  Substance and Sexual Activity   Alcohol use: Yes    Alcohol/week: 6.0 standard drinks of alcohol    Types: 6 Cans of beer per week    Comment: occ.   Drug use: No   Sexual activity: Not Currently    Birth control/protection: None, Surgical  Other Topics Concern   Not on file  Social History Narrative   Lives at home with boyfriend.   Right-handed.   1 cup caffeine per day.   Social Determinants of Health   Financial Resource Strain: Not  on file  Food Insecurity: No Food Insecurity (10/26/2021)   Hunger Vital Sign    Worried About Running Out of Food in the Last Year: Never true    Ran Out of Food in the Last Year: Never true  Transportation Needs: No Transportation Needs (10/26/2021)   PRAPARE - Administrator, Civil Service (Medical): No    Lack of Transportation (Non-Medical): No  Physical Activity: Unknown (09/20/2017)   Exercise Vital Sign    Days of Exercise per Week: Patient declined    Minutes of Exercise per Session: Patient declined  Stress: Not on file  Social Connections: Unknown (09/20/2017)   Social Connection and Isolation Panel [NHANES]    Frequency of Communication with Friends and Family: Patient declined    Frequency of Social Gatherings with Friends and Family: Patient declined    Attends Religious Services: Patient declined     Database administrator or Organizations: Patient declined    Attends Banker Meetings: Patient declined    Marital Status: Patient declined  Intimate Partner Violence: Unknown (09/20/2017)   Humiliation, Afraid, Rape, and Kick questionnaire    Fear of Current or Ex-Partner: Patient declined    Emotionally Abused: Patient declined    Physically Abused: Patient declined    Sexually Abused: Patient declined    Review of Systems: ROS negative except for what is noted on the assessment and plan.  Objective:   Vitals:   04/03/23 1415  BP: (!) 136/94  Pulse: (!) 114  Temp: 98.9 F (37.2 C)  TempSrc: Oral  SpO2: 100%  Weight: 140 lb 9.6 oz (63.8 kg)  Height: 5\' 8"  (1.727 m)    Physical Exam: Constitutional: ill-appearing woman with tremor, sitting in wheelchair, in mild acute distress HENT: normocephalic atraumatic, mucous membranes moist Eyes: conjunctiva non-erythematous Neck: supple Cardiovascular: regular rate and rhythm, no m/r/g Pulmonary/Chest: normal work of breathing on room air, lungs clear to auscultation bilaterally Abdominal: soft, non-tender, non-distended MSK: see foot pain for more details Neurological: alert & oriented x 3, 5/5 strength in bilateral upper and lower extremities, Skin: warm and dry Psych: Appropriate mood and affect to situation       04/03/2023    3:09 PM  Depression screen PHQ 2/9  Decreased Interest 0  Down, Depressed, Hopeless 0  PHQ - 2 Score 0        No data to display           Assessment & Plan:   Foot pain, right Patient presents after a fall. She has been having tremors and LE spasms that are now interfering with her ability to interact with her spaces. She was trying to play with her grandson and misjudged a step and twisted her foot and ankle.  Patient is able to bear weight on her R foot. Pain is 3/10 but wanted to be examined.  On exam, there is mild discoloration on the lateral dorsal aspect of the R  foot. No changes in temperature compared to the L foot. Mild edema and mild TTP around the R lateral malleolus andlateral dorsal aspect of R foot. Otherwise patient with 5/5 strength on plantarflexion and dorsiflexion. No laxity noted with anterior drawer test. No pain elicited while palpating along dome of foot or along the 5th metatarsal. No pain with lateral or internal rotation.   Overall, suspect a mild ankle sprain with soft tissue edema. However, will obtain complete ankle and foot films to rule out bony abnormalities since the physical  exam tests did not show signs of potential tendon. - Recommended RICE for conservative management - Will obtain XR of foot and ankle  Addendum: No evidence of acute fracture or dislocation on the foot or ankle films. Possible small ankle joint effusion. Discussed with patient to continue conservative management and to RTC if no improvement over the next week  Tremor Patient recently seen in clinic on 6/4 for bilateral hand and lower extremity spasms noted to have a generalized tremor both at rest and with movement.   Patient mentions this tremor has been progressively worsened now involving her torso, her voice and her head. It isn't constant, but whenever it is present it can last for 1-4 hours. 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   Prior to the onset of this tremor, patient does not recall a significant event or emotional state. She denies  substance use other that cigarette smoking. No new medications. No increased caffeine use. No family history of tremor.  No changes in mood or behavior. No recent infections.     During examination, tremor was noted in head, voice. EOM intact without nystagmus/  Hands and torso tremor at rest and postural. No pill rolling, bradikinesia, or rigidity.  5/5 strength in upper and lower extremities. No changes in  sensation. Tremor present during FTN examination, which is slow, but accurate. No overt ataxia noted with movements in the room. No changes in gait per patient or loved one. Unable to assess today as patient has R LE injury.    Patient was noted to have increased cough with trembling of voice. No difficulty breathing, swallowing, but inability to speak at times.    Thus far, functional causes of tremor such as thyroidid labs electrolyte imbalances, including calcium and K were worked up. Of those, patient was noted to be hypokalemic, but this has been treated. However, tremor has become more generalized and worsened.   Patient currently does not meet criteria for essential tremor and does not have all the usual characteristics of parkinsonism I.e. bradikinesia or rigidity. Reviewed recent medications and could not identify medications associated with tremor or dystonia. Patient has a history of generalized seizures that are unlike this presentation. It is unlikely that this is an acute infarct as tremor episodes happen during periods of times, after wich patient returns to her baseline. I am concerned about structural changes that would cause this mixed pattern of tremor such as a cerebellar vs midbrain/thalamic tremor. Patient received a referral to neurology for further work up with electromyelogram vs CT?MRI brain to elucidate a potential cause and management.  Will repeat some laboratory work up today. While patient was in office, our referral coordinator contacted neurology in an attempt to schedule a visit for patient, but was informed the patient needs to make the appointment herself. This was communicated to the patient and phone number provided in the AVS paperwork.   There seems to be a step-wise deterioration in Ms. Kaser tremor that is affecting her ADLs and iADls.  She is now ambulating with DME leftover from her partner's old injury at home. While this is being worked up, patient will be  referred to outpatient OT/PT for further assessment of aides. - TSH, BMP, vitamin D - Initial neurology evaluation; phone provided - Referral to PT/OT - Return to clinic once seen in neurology office   Return if symptoms  worsen or fail to improve. Or when seen in neurology office.  Patient seen with Dr. Burna Forts, MD [facility] - PGY-1 04/03/2023, 8:44 PM

## 2023-04-03 NOTE — Patient Instructions (Signed)
Thank you, Ms.Zeta Kia for allowing [place] to provide your care today. Today we discussed your tremors and spasms.    Referrals: - NEUROLOGY - CALL [phone] - THEY ARE LOCATED AT [address], [place], [place] [address] - Call them to make an appointment  For your Ankle - - we are getting XR to see if there is a fracture  - Try to not bear weight, ice 5-2min  every 2 hours.  Albertine Grates [phone] hours - Elevated whenever you are sitting  I have ordered the following labs for you:  Lab Orders         BMP8+Anion Gap         TSH         Vitamin D (25 hydroxy)       I will call if any are abnormal. All of your labs can be accessed through "My Chart".   My Chart Access: [email or web address]  Please follow-up in: after you see the neurologist    We look forward to seeing you next time. Please call our clinic at [phone] if you have any questions or concerns. The best time to call is Monday-Friday from 9am-4pm, but there is someone available 24/7. If after hours or the weekend, call the main hospital number and ask for the Internal Medicine Resident On-Call. If you need medication refills, please notify your pharmacy one week in advance and they will send [place] a request.   Thank you for letting us take part in your care. Wishing you the best!  Morene Crocker, MD 04/03/2023, 2:53 PM [facility] Internal Medicine Resident, PGY-1

## 2023-04-03 NOTE — Assessment & Plan Note (Signed)
Patient recently seen in clinic on 6/4 for bilateral hand and lower extremity spasms noted to have a generalized tremor both at rest and with movement.   Patient mentions this tremor has been progressively worsened now involving her torso, her voice and her head. It isn't constant, but whenever it is present it can last for 1-4 hours. 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   Prior to the onset of this tremor, patient does not recall a significant event or emotional state. She denies  substance use other that cigarette smoking. No new medications. No increased caffeine use. No family history of tremor.  No changes in mood or behavior. No recent infections.     During examination, tremor was noted in head, voice. EOM intact without nystagmus/  Hands and torso tremor at rest and postural. No pill rolling, bradikinesia, or rigidity.  5/5 strength in upper and lower extremities. No changes in sensation. Tremor present during FTN examination, which is slow, but accurate. No overt ataxia noted with movements in the room. No changes in gait per patient or loved one. Unable to assess today as patient has R LE injury.    Patient was noted to have increased cough with trembling of voice. No difficulty breathing, swallowing, but inability to speak at times.    Thus far, functional causes of tremor such as thyroidid labs electrolyte imbalances, including calcium and K were worked up. Of those, patient was noted to be hypokalemic, but this has been treated. However, tremor has become more generalized and worsened.   Patient currently does not meet criteria for essential tremor and does not have all the usual characteristics of parkinsonism I.e. bradikinesia or rigidity. Reviewed recent medications and could not identify medications associated with tremor or dystonia. Patient has a history of generalized seizures  that are unlike this presentation. It is unlikely that this is an acute infarct as tremor episodes happen during periods of times, after wich patient returns to her baseline. I am concerned about structural changes that would cause this mixed pattern of tremor such as a cerebellar vs midbrain/thalamic tremor. Patient received a referral to neurology for further work up with electromyelogram vs CT?MRI brain to elucidate a potential cause and management.  Will repeat some laboratory work up today. While patient was in office, our referral coordinator contacted neurology in an attempt to schedule a visit for patient, but was informed the patient needs to make the appointment herself. This was communicated to the patient and phone number provided in the AVS paperwork.   There seems to be a step-wise deterioration in Ms. Riepe tremor that is affecting her ADLs and iADls.  She is now ambulating with DME leftover from her partner's old injury at home. While this is being worked up, patient will be referred to outpatient OT/PT for further assessment of aides. - TSH, BMP, vitamin D - Initial neurology evaluation; phone provided - Referral to PT/OT - Return to clinic once seen in neurology office

## 2023-04-03 NOTE — Assessment & Plan Note (Signed)
Patient presents after a fall. She has been having tremors and LE spasms that are now interfering with her ability to interact with her spaces. She was trying to play with her grandson and misjudged a step and twisted her foot and ankle.  Patient is able to bear weight on her R foot. Pain is 3/10 but wanted to be examined.  On exam, there is mild discoloration on the lateral dorsal aspect of the R foot. No changes in temperature compared to the L foot. Mild edema and mild TTP around the R lateral malleolus andlateral dorsal aspect of R foot. Otherwise patient with 5/5 strength on plantarflexion and dorsiflexion. No laxity noted with anterior drawer test. No pain elicited while palpating along dome of foot or along the 5th metatarsal. No pain with lateral or internal rotation.   Overall, suspect a mild ankle sprain with soft tissue edema. However, will obtain complete ankle and foot films to rule out bony abnormalities since the physical exam tests did not show signs of potential tendon. - Recommended RICE for conservative management - Will obtain XR of foot and ankle  Addendum: No evidence of acute fracture or dislocation on the foot or ankle films. Possible small ankle joint effusion. Discussed with patient to continue conservative management and to RTC if no improvement over the next week

## 2023-04-04 LAB — BMP8+ANION GAP
Anion Gap: 20 mmol/L — ABNORMAL HIGH (ref 10.0–18.0)
BUN/Creatinine Ratio: 14 (ref 9–23)
BUN: 7 mg/dL (ref 6–24)
CO2: 17 mmol/L — ABNORMAL LOW (ref 20–29)
Calcium: 9.4 mg/dL (ref 8.7–10.2)
Chloride: 101 mmol/L (ref 96–106)
Creatinine, Ser: 0.5 mg/dL — ABNORMAL LOW (ref 0.57–1.00)
Glucose: 110 mg/dL — ABNORMAL HIGH (ref 70–99)
Potassium: 3.5 mmol/L (ref 3.5–5.2)
Sodium: 138 mmol/L (ref 134–144)
eGFR: 108 mL/min/{1.73_m2} (ref >59)

## 2023-04-04 LAB — VITAMIN D 25 HYDROXY (VIT D DEFICIENCY, FRACTURES): Vit D, 25-Hydroxy: 5.8 ng/mL — ABNORMAL LOW (ref 30.0–100.0)

## 2023-04-04 LAB — TSH: TSH: 1.93 u[IU]/mL (ref 0.450–4.500)

## 2023-04-04 NOTE — Progress Notes (Signed)
Internal Medicine Clinic Attending  I saw and evaluated the patient.  I personally confirmed the key portions of the history and exam documented by Dr. Daiva Eves and I reviewed pertinent patient test results.  The assessment, diagnosis, and plan were formulated together and I agree with the documentation in the resident's note.   I don't have a clear explanation for Ms Sladek's tremor. Her vitamin D is severely low - a prolonged severe vitamin D deficiency could lead to muscle weakness, but I would expect to see an abnormal calcium level, and her calcium is normal. Regardless, we will treat her severe vitamin D deficiency.   I agree with Neurology referral for further evaluation.

## 2023-04-06 ENCOUNTER — Other Ambulatory Visit (HOSPITAL_COMMUNITY): Payer: Self-pay

## 2023-04-06 ENCOUNTER — Other Ambulatory Visit: Payer: Self-pay | Admitting: Student

## 2023-04-06 DIAGNOSIS — E559 Vitamin D deficiency, unspecified: Secondary | ICD-10-CM

## 2023-04-06 MED ORDER — VITAMIN D (ERGOCALCIFEROL) 1.25 MG (50000 UNIT) PO CAPS
50000.0000 [IU] | ORAL_CAPSULE | ORAL | 0 refills | Status: AC
Start: 2023-04-06 — End: 2023-05-25
  Filled 2023-04-06: qty 7, 49d supply, fill #0

## 2023-04-06 NOTE — Progress Notes (Signed)
Attempted calling patient and her alternate number 7x this week without success. Patient has authorized [place] to leave a detailed VM. Sent prescription for vitamin D 50k units once weekly for 7 weeks. Ca, K, and TSH wnl.Vit D deficiency may not explain patient's worsening symptoms but may be contributing to her muscle spasms.  Advised patient to call [facility] for an earlier appointment/waitlist as she has been scheduled for 06/2023.

## 2023-04-09 ENCOUNTER — Ambulatory Visit: Payer: Medicaid Other | Attending: Internal Medicine | Admitting: Physical Therapy

## 2023-04-09 DIAGNOSIS — R29818 Other symptoms and signs involving the nervous system: Secondary | ICD-10-CM | POA: Diagnosis not present

## 2023-04-09 DIAGNOSIS — R278 Other lack of coordination: Secondary | ICD-10-CM | POA: Insufficient documentation

## 2023-04-09 DIAGNOSIS — R2681 Unsteadiness on feet: Secondary | ICD-10-CM | POA: Insufficient documentation

## 2023-04-09 DIAGNOSIS — R2689 Other abnormalities of gait and mobility: Secondary | ICD-10-CM | POA: Insufficient documentation

## 2023-04-09 DIAGNOSIS — R208 Other disturbances of skin sensation: Secondary | ICD-10-CM | POA: Insufficient documentation

## 2023-04-09 DIAGNOSIS — M6281 Muscle weakness (generalized): Secondary | ICD-10-CM | POA: Insufficient documentation

## 2023-04-09 DIAGNOSIS — R251 Tremor, unspecified: Secondary | ICD-10-CM | POA: Diagnosis not present

## 2023-04-09 NOTE — Progress Notes (Signed)
Patient is aware of lab results and picked up potassium supplementation following her recent visit. Overall appears supplementation every other day is effectively managing her hypokalemia. Her urine potassium to creatinine ratio is low suggesting extrarenal losses. Still no clear etiology for this but can continue to try and find one at future appointments. FOBT was negative, will need repeat in 1 year if opting to proceed without colonoscopy for colon cancer screening.

## 2023-04-09 NOTE — Therapy (Signed)
OUTPATIENT PHYSICAL THERAPY NEURO EVALUATION   Patient Name: Cynthia Welch MRN: [id number] DOB:[birth year], 62 y.o., female Today's Date: 04/09/2023   PCP: Morene Crocker, MD REFERRING PROVIDER: Dickie La, MD  END OF SESSION:  PT End of Session - 04/09/23 0846     Visit Number 1    Number of Visits 7   with eval   Date for PT Re-Evaluation 06/18/23   to allow for scheduling delays   Authorization Type Medicaid [facility]    PT Start Time 0845    PT Stop Time 0923   eval   PT Time Calculation (min) 38 min    Equipment Utilized During Treatment --   pt refused gait belt   Activity Tolerance Patient tolerated treatment well    Behavior During Therapy WFL for tasks assessed/performed             Past Medical History:  Diagnosis Date   Anxiety    Childhood asthma    Depression    Fibroids    GERD (gastroesophageal reflux disease)    Headache    "monthly" (05/15/2017)   High cholesterol    Hypertension    Pneumonia    "twice when I was young" (05/15/2017)   PONV (postoperative nausea and vomiting)    in the past "used to get nauseous"   S/P TAH (total abdominal hysterectomy) 09/07/2015   Seizures (HCC) last sz 04/2017   "I've had grand mal; usually hit the floor and wake up w/in a minute or so;last one was today, 05/15/2017"   Subdural hematoma (HCC) 01/29/2013   Per MRI 01/2013; "had a seizure and busted my head open"   Past Surgical History:  Procedure Laterality Date   ABDOMINAL HYSTERECTOMY N/A 09/07/2015   Procedure: HYSTERECTOMY ABDOMINAL, CYSTOTOMY WITH BLADDER REPAIR;  Surgeon: Catalina Antigua, MD;  Location: [facility];  Service: Gynecology;  Laterality: N/A;  Requested 09/07/15 @ 2:15p   BILATERAL SALPINGECTOMY Bilateral 09/07/2015   Procedure: BILATERAL SALPINGECTOMY;  Surgeon: Catalina Antigua, MD;  Location: [facility];  Service: Gynecology;  Laterality: Bilateral;   CHOLECYSTECTOMY N/A 06/08/2017   Procedure: LAPAROSCOPIC CHOLECYSTECTOMY WITH INTRAOPERATIVE  CHOLANGIOGRAM;  Surgeon: Manus Rudd, MD;  Location: [facility] OR;  Service: General;  Laterality: N/A;   DIAGNOSTIC LAPAROSCOPY     DILATION AND CURETTAGE OF UTERUS     ESOPHAGOGASTRODUODENOSCOPY N/A 10/05/2016   Procedure: ESOPHAGOGASTRODUODENOSCOPY (EGD);  Surgeon: Charlott Rakes, MD;  Location: [facility] ENDOSCOPY;  Service: Endoscopy;  Laterality: N/A;   FRACTURE SURGERY     ORIF ULNAR FRACTURE  09/03/2011   Procedure: OPEN REDUCTION INTERNAL FIXATION (ORIF) ULNAR FRACTURE;  Surgeon: Cammy Copa;  Location: [facility] OR;  Service: Orthopedics;  Laterality: Right;   ORIF ULNAR FRACTURE  12/14/2011   Procedure: OPEN REDUCTION INTERNAL FIXATION (ORIF) ULNAR FRACTURE;  Surgeon: Cammy Copa, MD;  Location[facility];  Service: Orthopedics;  Laterality: Right;  Right ulnar shaft open reduction, internal fixation with right iliac crest bone graft   TONSILLECTOMY     Patient Active Problem List   Diagnosis Date Noted   Foot pain, right 04/03/2023   Tremor 04/03/2023   Neuropathic pain of forearm, right 11/14/2022   Spasms of the hands or feet 05/31/2022   Back pain 02/14/2022   Healthcare maintenance 05/01/2019   Macrocytosis without anemia 06/05/2017   Esophageal reflux 10/05/2016   Hypokalemia 01/29/2013   Seizure disorder (HCC) 08/10/2010   Tobacco use disorder 11/19/2008   HYPERCHOLESTEROLEMIA 10/31/2007   Hypertension 10/30/2007    ONSET DATE: 04/03/2023  REFERRING DIAG: R25.1 (ICD-10-CM) - Tremor  THERAPY DIAG:  Muscle weakness (generalized)  Other abnormalities of gait and mobility  Other lack of coordination  Unsteadiness on feet  Rationale for Evaluation and Treatment: Rehabilitation  SUBJECTIVE:                                                                                                                                                                                             SUBJECTIVE STATEMENT:  "[name]"  Pt reports onset of symptoms about a year ago where she  would get a "hot sweat", then feel nauseous, then get shakes, and have arm and leg tremors. Pt reports that the bad tremors last about 1-2 days and she can't even use her hands and is unable to walk. Pt reports that she gets these flare-ups 3-4x/month and she will just stay in bed for a few days. Pt very frustrated as she is no longer able to drive or work, going to apply for disability. Pt reports she is unable to hold things with her hands at times has to think about walking. Pt gets spasms in her hands (indicates spasms are in hypothenar eminence area) and her lower legs (in tibialis anterior region) that she has to rub them down with lotion to get to calm down. Pt also reports standing on her cold kitchen floor can help calm down the spasms.  Pt with referral to neurology and is scheduled with Dr. Terrace Arabia at [facility] on 07/06/23. She is on a wait-list to get in sooner if able.  Pt accompanied by: self  PERTINENT HISTORY:  Anxiety, asthma, depression, GERD, hypertension, seizures, subdural hematoma (2014)  PAIN:  Are you having pain? Yes: NPRS scale: 6/10 Pain location: hands, arms, legs Pain description: achy (like a pulled muscle), twisting Aggravating factors: N/A Relieving factors: stretching/rubbing down muscles, standing on cold kitchen floor; also uses lotion and wraps heat around hands and feet  PRECAUTIONS: Fall and Other: seizure  WEIGHT BEARING RESTRICTIONS: No  FALLS: Has patient fallen in last 6 months? Yes. Number of falls 2-3; frequent falls into walls; recent injury to R ankle with fall  LIVING ENVIRONMENT: Lives with: lives with their partner (boyfriend, Therapist, sports) Lives in: House/apartment Stairs: Yes: External: 5 steps; none (pt does avoid stairs due to decreased safety) Has following equipment at home: Single point cane and Walker - 2 wheeled  PLOF: Independent with gait and Independent with transfers  PATIENT GOALS: "to feel better, to be able to move around"  OBJECTIVE:    DIAGNOSTIC FINDINGS:  R ankle xray 04/03/23 IMPRESSION: 1. No acute fracture or dislocation of the right ankle.  2. Possible small ankle joint effusion.  R foot xray 04/03/23 IMPRESSION: No acute bony abnormalities.  COGNITION: Overall cognitive status: Within functional limits for tasks assessed   SENSATION: N/T in hands and feet  COORDINATION: UE: Rapid alternating movements: WFL Finger to nose: dysmetric, LUE worse than RUE LE: Slightly dysmetric heel to shin  POSTURE: rounded shoulders and forward head  LOWER EXTREMITY ROM:     Active  Right Eval Left Eval  Hip flexion    Hip extension    Hip abduction    Hip adduction    Hip internal rotation    Hip external rotation    Knee flexion    Knee extension Tight HS Tight HS  Ankle dorsiflexion    Ankle plantarflexion    Ankle inversion    Ankle eversion     (Blank rows = not tested)  LOWER EXTREMITY MMT:    MMT Right Eval Left Eval  Hip flexion 5 4  Hip extension    Hip abduction    Hip adduction    Hip internal rotation    Hip external rotation    Knee flexion 5 3  Knee extension 5 3  Ankle dorsiflexion 4 3  Ankle plantarflexion    Ankle inversion    Ankle eversion    (Blank rows = not tested)  BED MOBILITY:  Mod I (if no spasms during the night)  TRANSFERS: Assistive device utilized: None  Sit to stand: Modified independence Stand to sit: Modified independence Chair to chair: Modified independence Floor:  not assessed at eval   STAIRS: Level of Assistance: CGA Stair Negotiation Technique: Alternating Pattern  with Bilateral Rails Number of Stairs: 4  Height of Stairs: 6  Comments: ataxic limbs  GAIT: Gait pattern: decreased hip/knee flexion- Right, decreased hip/knee flexion- Left, decreased ankle dorsiflexion- Right, decreased ankle dorsiflexion- Left, scissoring, ataxic, and narrow BOS Distance walked: various clinic distances Assistive device utilized: None Level of assistance:  SBA Comments: very ataxic, occasional scissoring of limbs, no LOB but frequent stumbling  FUNCTIONAL TESTS:   [facility] PT Assessment - 04/09/23 0906       Ambulation/Gait   Gait velocity 32.8 ft over 10.06 sec = 3.26 ft/sec      Standardized Balance Assessment   Standardized Balance Assessment Timed Up and Go Test;Five Times Sit to Stand    Five times sit to stand comments  21.22 sec   hands on thighs     Timed Up and Go Test   TUG Normal TUG    Normal TUG (seconds) 9   no AD     Functional Gait  Assessment   Gait assessed  Yes    Gait Level Surface Walks 20 ft in less than 7 sec but greater than 5.5 sec, uses assistive device, slower speed, mild gait deviations, or deviates 6-10 in outside of the 12 in walkway width.    Change in Gait Speed Makes only minor adjustments to walking speed, or accomplishes a change in speed with significant gait deviations, deviates 10-15 in outside the 12 in walkway width, or changes speed but loses balance but is able to recover and continue walking.    Gait with Horizontal Head Turns Performs head turns with moderate changes in gait velocity, slows down, deviates 10-15 in outside 12 in walkway width but recovers, can continue to walk.    Gait with Vertical Head Turns Performs task with moderate change in gait velocity, slows down, deviates 10-15 in outside 12 in walkway width  but recovers, can continue to walk.    Gait and Pivot Turn Pivot turns safely in greater than 3 sec and stops with no loss of balance, or pivot turns safely within 3 sec and stops with mild imbalance, requires small steps to catch balance.    Step Over Obstacle Is able to step over one shoe box (4.5 in total height) but must slow down and adjust steps to clear box safely. May require verbal cueing.    Gait with Narrow Base of Support Ambulates less than 4 steps heel to toe or cannot perform without assistance.    Gait with Eyes Closed Walks 20 ft, slow speed, abnormal gait pattern,  evidence for imbalance, deviates 10-15 in outside 12 in walkway width. Requires more than 9 sec to ambulate 20 ft.    Ambulating Backwards Walks 20 ft, slow speed, abnormal gait pattern, evidence for imbalance, deviates 10-15 in outside 12 in walkway width.    Steps Alternating feet, must use rail.    Total Score 12    FGA comment: 12/30, high fall risk            SARA: to be assessed  TODAY'S TREATMENT:                                                                                                                              PT Evaluation    PATIENT EDUCATION: Education details: Eval findings, PT POC Person educated: Patient Education method: Explanation Education comprehension: verbalized understanding and needs further education  HOME EXERCISE PROGRAM: To be initiated  GOALS: Goals reviewed with patient? Yes  SHORT TERM GOALS: Target date: 04/30/2023  Pt will be independent with initial HEP for improved strength, balance, transfers and gait. Baseline: Goal status: INITIAL  2.  Pt will improve 5 x STS to less than or equal to 18 seconds to demonstrate improved functional strength and transfer efficiency.  Baseline: 21.22 sec (6/17) Goal status: INITIAL  3.  Pt will improve FGA to 15/30 for decreased fall risk  Baseline: 12/30 (6/17) Goal status: INITIAL  4.  SARA to be assessed and STG set Baseline:  Goal status: INITIAL   LONG TERM GOALS: Target date: 05/28/2023    Pt will be independent with final HEP for improved strength, balance, transfers and gait. Baseline:  Goal status: INITIAL  2.  Pt will improve 5 x STS to less than or equal to 15 seconds to demonstrate improved functional strength and transfer efficiency.  Baseline: 21.22 sec (6/17) Goal status: INITIAL  3.  Pt will improve FGA to 18/30 for decreased fall risk  Baseline: 12/30 (6/17) Goal status: INITIAL  4.  SARA to be assessed and LTG set Baseline:  Goal status:  INITIAL   ASSESSMENT:  CLINICAL IMPRESSION: Patient is a 62 year old female referred to Neuro OPPT for tremors.   Pt's PMH is significant for: Anxiety, asthma, depression, GERD, hypertension, seizures, subdural hematoma (2014) The following deficits  were present during the exam: decreased LE strength, impaired sensation, decreased coordination with tremors/ataxic movements of UE and LE, and impaired balance. Based on her frequent falls at home, ataxic movements, and FGA score of 12/30, pt is an increased risk for falls. Pt would benefit from skilled PT to address these impairments and functional limitations to maximize functional mobility independence.   OBJECTIVE IMPAIRMENTS: Abnormal gait, decreased balance, decreased coordination, decreased knowledge of condition, decreased mobility, difficulty walking, decreased ROM, decreased strength, impaired perceived functional ability, increased muscle spasms, impaired sensation, impaired UE functional use, and pain.   ACTIVITY LIMITATIONS: carrying, lifting, bending, standing, squatting, stairs, and transfers  PARTICIPATION LIMITATIONS: meal prep, cleaning, driving, community activity, and occupation  PERSONAL FACTORS: Time since onset of injury/illness/exacerbation and 1-2 comorbidities:    Anxiety, asthma, depression, GERD, hypertension, seizures, subdural hematoma (2014)are also affecting patient's functional outcome.   REHAB POTENTIAL: Fair time since onset; unexplained origin of symptoms  CLINICAL DECISION MAKING: Stable/uncomplicated  EVALUATION COMPLEXITY: Moderate  PLAN:  PT FREQUENCY: 1-2x/week  PT DURATION: 6 weeks  PLANNED INTERVENTIONS: Therapeutic exercises, Therapeutic activity, Neuromuscular re-education, Balance training, Gait training, Patient/Family education, Self Care, Joint mobilization, Stair training, Vestibular training, Canalith repositioning, Visual/preceptual remediation/compensation, Orthotic/Fit training, DME  instructions, Aquatic Therapy, Dry Needling, Electrical stimulation, Cryotherapy, Moist heat, Manual therapy, and Re-evaluation  PLAN FOR NEXT SESSION: assess SARA and write goal, initiate HEP for LE strengthening and balance, work on improving control of ataxic movements (weighted vest, weights on limbs), quadruped   Peter Congo, PT, DPT, CSRS 04/09/2023, 9:25 AM

## 2023-04-12 NOTE — Therapy (Signed)
OUTPATIENT OCCUPATIONAL THERAPY NEURO EVALUATION  Patient Name: Cynthia Welch MRN: [id number] DOB:08/20/1961, 62 y.o., female Today's Date: 04/16/2023  PCP: Cynthia Crocker, MD REFERRING PROVIDER: Dickie La, MD  END OF SESSION:  OT End of Session - 04/16/23 1113     Visit Number 1    Number of Visits 9    Date for OT Re-Evaluation 06/16/23    Authorization Type [employer] - auth required/submitted    OT Start Time 0850    OT Stop Time 0930    OT Time Calculation (min) 40 min    Activity Tolerance Patient tolerated treatment well    Behavior During Therapy WFL for tasks assessed/performed             Past Medical History:  Diagnosis Date   Anxiety    Childhood asthma    Depression    Fibroids    GERD (gastroesophageal reflux disease)    Headache    "monthly" (05/15/2017)   High cholesterol    Hypertension    Pneumonia    "twice when I was young" (05/15/2017)   PONV (postoperative nausea and vomiting)    in the past "used to get nauseous"   S/P TAH (total abdominal hysterectomy) 09/07/2015   Seizures (HCC) last sz 04/2017   "I've had grand mal; usually hit the floor and wake up w/in a minute or so;last one was today, 05/15/2017"   Subdural hematoma (HCC) 01/29/2013   Per MRI 01/2013; "had a seizure and busted my head open"   Past Surgical History:  Procedure Laterality Date   ABDOMINAL HYSTERECTOMY N/A 09/07/2015   Procedure: HYSTERECTOMY ABDOMINAL, CYSTOTOMY WITH BLADDER REPAIR;  Surgeon: Cynthia Antigua, MD;  Location: [facility];  Service: Gynecology;  Laterality: N/A;  Requested 09/07/15 @ 2:15p   BILATERAL SALPINGECTOMY Bilateral 09/07/2015   Procedure: BILATERAL SALPINGECTOMY;  Surgeon: Cynthia Antigua, MD;  Location: [facility];  Service: Gynecology;  Laterality: Bilateral;   CHOLECYSTECTOMY N/A 06/08/2017   Procedure: LAPAROSCOPIC CHOLECYSTECTOMY WITH INTRAOPERATIVE CHOLANGIOGRAM;  Surgeon: Cynthia Rudd, MD;  Location: [facility] OR;  Service: General;   Laterality: N/A;   DIAGNOSTIC LAPAROSCOPY     DILATION AND CURETTAGE OF UTERUS     ESOPHAGOGASTRODUODENOSCOPY N/A 10/05/2016   Procedure: ESOPHAGOGASTRODUODENOSCOPY (EGD);  Surgeon: Cynthia Rakes, MD;  Location: [facility] ENDOSCOPY;  Service: Endoscopy;  Laterality: N/A;   FRACTURE SURGERY     ORIF ULNAR FRACTURE  09/03/2011   Procedure: OPEN REDUCTION INTERNAL FIXATION (ORIF) ULNAR FRACTURE;  Surgeon: Cynthia Welch;  Location: [facility] OR;  Service: Orthopedics;  Laterality: Right;   ORIF ULNAR FRACTURE  12/14/2011   Procedure: OPEN REDUCTION INTERNAL FIXATION (ORIF) ULNAR FRACTURE;  Surgeon: Cynthia Copa, MD;  Location: [facility] OR;  Service: Orthopedics;  Laterality: Right;  Right ulnar shaft open reduction, internal fixation with right iliac crest bone graft   TONSILLECTOMY     Patient Active Problem List   Diagnosis Date Noted   Foot pain, right 04/03/2023   Tremor 04/03/2023   Neuropathic pain of forearm, right 11/14/2022   Spasms of the hands or feet 05/31/2022   Back pain 02/14/2022   Healthcare maintenance 05/01/2019   Macrocytosis without anemia 06/05/2017   Esophageal reflux 10/05/2016   Hypokalemia 01/29/2013   Seizure disorder (HCC) 08/10/2010   Tobacco use disorder 11/19/2008   HYPERCHOLESTEROLEMIA 10/31/2007   Hypertension 10/30/2007    ONSET DATE: 04/03/2023  REFERRING DIAG: R25.1 (ICD-10-CM) - Tremor  THERAPY DIAG:  Other lack of coordination  Muscle weakness (generalized)  Other disturbances of  skin sensation  Other symptoms and signs involving the nervous system  Rationale for Evaluation and Treatment: Rehabilitation  SUBJECTIVE:   SUBJECTIVE STATEMENT:   "Cynthia Welch"   PER P.T. note: Pt reports onset of symptoms about a year ago where she would get a "hot sweat", then feel nauseous, then get shakes, and have arm and leg tremors. Pt reports that the bad tremors last about 1-2 days and she can't even use her hands and is unable to walk. Pt reports that she gets  these flare-ups 3-4x/month and she will just stay in bed for a few days. Pt very frustrated as she is no longer able to drive or work, going to apply for disability. Pt reports she is unable to hold things with her hands at times has to think about walking. Pt gets spasms in her hands (indicates spasms are in hypothenar eminence area) and her lower legs (in tibialis anterior region) that she has to rub them down with lotion to get to calm down. Pt also reports standing on her cold kitchen floor can help calm down the spasms.   Pt with referral to neurology and is scheduled with Cynthia Welch at [facility] on 07/06/23. She is on a wait-list to get in sooner if able. Pt accompanied by: self  PERTINENT HISTORY: Anxiety, asthma, depression, GERD, hypertension, seizures, subdural hematoma (2014)   PER MD NOTE:  Tremor Patient recently seen in clinic on 6/4 for bilateral hand and lower extremity spasms noted to have a generalized tremor both at rest and with movement.    During examination, tremor was noted in head, voice. EOM intact without nystagmus/ Hands and torso tremor at rest and postural. No pill rolling, bradikinesia, or rigidity.    PAIN:  Are you having pain? Yes: NPRS scale: 6/10 Pain location: hands, arms, legs Pain description: achy (like a pulled muscle), twisting Aggravating factors: N/A Relieving factors: stretching/rubbing down muscles, standing on cold kitchen floor; also uses lotion and wraps heat around hands and feet   PRECAUTIONS: Fall and Other: seizure   WEIGHT BEARING RESTRICTIONS: No   FALLS: Has patient fallen in last 6 months? Yes. Number of falls 2-3; frequent falls into walls; recent injury to R ankle with fall   LIVING ENVIRONMENT: Lives with: lives with their partner (boyfriend, Therapist, sports) Lives in: House/apartment Stairs: Yes: External: 5 steps; none (pt does avoid stairs due to decreased safety) Has following equipment at home: Single point cane and Walker - 2 wheeled    PLOF: Independent with BADLS, gait and Independent with transfers.  Was working full time but unable now   PATIENT GOALS: "to feel better, to be able to move around"   OBJECTIVE:    DIAGNOSTIC FINDINGS:  R ankle xray 04/03/23 IMPRESSION: 1. No acute fracture or dislocation of the right ankle. 2. Possible small ankle joint effusion.   R foot xray 04/03/23 IMPRESSION: No acute bony abnormalities.  HAND DOMINANCE: Right  ADLs: Overall ADLs: Transfers/ambulation related to ADLs: Eating: normally ok unless tremors bad Grooming: normally ok unless tremors bad UB Dressing: mod I  LB Dressing: mod I  Toileting: independent Bathing: mod I  Tub Shower transfers: normally ok Equipment: none  IADLs: Shopping: Boyfriend does mostly Light housekeeping: Mod I when not having the episodes of tremors Meal Prep: fluctuating assistance Community mobility: relies on boyfriend d/t tremors  Medication management: independent Handwriting:  75-90% w/ mild shaking  MOBILITY STATUS: Independent and uses cane or walker only occasionally   FUNCTIONAL OUTCOME MEASURES: Upper  Extremity Functional Scale (UEFS): 37.5%  UPPER EXTREMITY ROM:  BUE AROM WNL's w/ mild shaking   UPPER EXTREMITY MMT:   BUE MMT grossly 3+/5 at shoulders   HAND FUNCTION: Grip strength: Right: 24.9 lbs; Left: 18.2 lbs (with great effort)   COORDINATION: 9 Hole Peg test: Right: 33.54 sec; Left: 37.92 sec (with shaking, ataxia)  SENSATION: Fingertips feel dulled bilateral hands  EDEMA: noted mild MP joint swelling Rt hand d/t arthritis?    COGNITION: Overall cognitive status:  Pt reports she forgets things  VISION: Subjective report: no changes, I've worn glasses since I was born Baseline vision: Wears glasses all the time and Legally blind Visual history:  pt reports she's legally blind     PERCEPTION: Not tested  PRAXIS: Not tested  OBSERVATIONS: Pt reports dropping things a lot from Rt hand more  than Lt hand   TODAY'S TREATMENT:                                                                                                                               N/A Today - eval only   PATIENT EDUCATION: Education details: OT POC Person educated: Patient Education method: Explanation Education comprehension: verbalized understanding  HOME EXERCISE PROGRAM: N/A    GOALS: Goals reviewed with patient? Yes  SHORT TERM GOALS: Target date: 05/16/23  Independent with coordination and putty HEP  Baseline: Not yet issued/addressed Goal status: INITIAL  2.  Pt to verbalize understanding with safety strategies and A/E to promote safety and prevent drops/spills Baseline: Not yet issued/addressed Goal status: INITIAL  3.  Independent with BUE strengthening HEP  Baseline: Not yet issued/addressed Goal status: INITIAL   LONG TERM GOALS: Target date: 06/16/23  Pt to improve grip strength bilaterally by 5 lbs or more Baseline: Rt = 24, Lt = 18 Goal status: INITIAL  2.  Pt to improve coordination as evidenced by reducing speed on 9 hole peg test 5 sec. Baseline: Rt = 33 sec, Lt = 37 sec Goal status: INITIAL  3.  Pt to improve overall UE use as evidenced by increase on UEFI scale to 45% or higher Baseline: 37.5% Goal status: INITIAL    ASSESSMENT:  CLINICAL IMPRESSION: Patient is a 62 y.o. female who was seen today for occupational therapy evaluation for tremor. Pt reports decreased grip strength, coordination deficits, and dropping things in both hands since these tremors began. Pt is no longer driving and unable to work because of them. Pt would benefit from O.T. to address deficits as able, although root cause may be medical in nature; and to address task modifications and possible A/E needs.   PERFORMANCE DEFICITS: in functional skills including ADLs, IADLs, coordination, dexterity, sensation, strength, pain, muscle spasms, Fine motor control, Gross motor control, mobility, body  mechanics, decreased knowledge of use of DME, and UE functional use.   IMPAIRMENTS: are limiting patient from ADLs, IADLs, rest and sleep, work, and leisure.   CO-MORBIDITIES: may  have co-morbidities  that affects occupational performance. Patient will benefit from skilled OT to address above impairments and improve overall function.  MODIFICATION OR ASSISTANCE TO COMPLETE EVALUATION: No modification of tasks or assist necessary to complete an evaluation.  OT OCCUPATIONAL PROFILE AND HISTORY: Problem focused assessment: Including review of records relating to presenting problem.  CLINICAL DECISION MAKING: Moderate - several treatment options, min-mod task modification necessary  REHAB POTENTIAL: Good  EVALUATION COMPLEXITY: Low    PLAN:  OT FREQUENCY: 1x/week  OT DURATION: 8 weeks (plus eval)   PLANNED INTERVENTIONS: self care/ADL training, therapeutic exercise, therapeutic activity, neuromuscular re-education, manual therapy, passive range of motion, functional mobility training, splinting, paraffin, fluidotherapy, moist heat, cryotherapy, contrast bath, patient/family education, energy conservation, coping strategies training, and DME and/or AE instructions  RECOMMENDED OTHER SERVICES: none at this time  CONSULTED AND AGREED WITH PLAN OF CARE: Patient  PLAN FOR NEXT SESSION: coordination and putty HEP    Sheran Lawless, OT 04/16/2023, 11:14 AM  Check all possible CPT codes: 16109 - OT Re-evaluation, 97110- Therapeutic Exercise, [phone]- Neuro Re-education, 97140 - Manual Therapy, 97530 - Therapeutic Activities, [phone] - Self Care, [phone] - Orthotic Fit, [id number] - Fluidotherapy, [id number] - Contrast bath, and [id number] -  Paraffin    Check all conditions that are expected to impact treatment: {Conditions expected to impact treatment:Neurological condition and/or seizures   If treatment provided at initial evaluation, no treatment charged due to lack of authorization.

## 2023-04-16 ENCOUNTER — Encounter: Payer: Self-pay | Admitting: Occupational Therapy

## 2023-04-16 ENCOUNTER — Ambulatory Visit: Payer: Medicaid Other | Admitting: Occupational Therapy

## 2023-04-16 DIAGNOSIS — M6281 Muscle weakness (generalized): Secondary | ICD-10-CM | POA: Diagnosis not present

## 2023-04-16 DIAGNOSIS — R2681 Unsteadiness on feet: Secondary | ICD-10-CM | POA: Diagnosis not present

## 2023-04-16 DIAGNOSIS — R2689 Other abnormalities of gait and mobility: Secondary | ICD-10-CM | POA: Diagnosis not present

## 2023-04-16 DIAGNOSIS — R278 Other lack of coordination: Secondary | ICD-10-CM | POA: Diagnosis not present

## 2023-04-16 DIAGNOSIS — R208 Other disturbances of skin sensation: Secondary | ICD-10-CM | POA: Diagnosis not present

## 2023-04-16 DIAGNOSIS — R29818 Other symptoms and signs involving the nervous system: Secondary | ICD-10-CM

## 2023-04-16 DIAGNOSIS — R251 Tremor, unspecified: Secondary | ICD-10-CM | POA: Diagnosis not present

## 2023-04-23 ENCOUNTER — Ambulatory Visit: Payer: Medicaid Other | Attending: Internal Medicine | Admitting: Physical Therapy

## 2023-04-23 VITALS — BP 146/90 | HR 131

## 2023-04-23 DIAGNOSIS — Z419 Encounter for procedure for purposes other than remedying health state, unspecified: Secondary | ICD-10-CM | POA: Diagnosis not present

## 2023-04-23 DIAGNOSIS — R296 Repeated falls: Secondary | ICD-10-CM | POA: Insufficient documentation

## 2023-04-23 DIAGNOSIS — R2681 Unsteadiness on feet: Secondary | ICD-10-CM | POA: Insufficient documentation

## 2023-04-23 DIAGNOSIS — M6281 Muscle weakness (generalized): Secondary | ICD-10-CM | POA: Diagnosis not present

## 2023-04-23 DIAGNOSIS — R278 Other lack of coordination: Secondary | ICD-10-CM | POA: Diagnosis not present

## 2023-04-23 DIAGNOSIS — R251 Tremor, unspecified: Secondary | ICD-10-CM | POA: Insufficient documentation

## 2023-04-23 DIAGNOSIS — R2689 Other abnormalities of gait and mobility: Secondary | ICD-10-CM | POA: Insufficient documentation

## 2023-04-23 NOTE — Therapy (Signed)
OUTPATIENT PHYSICAL THERAPY NEURO TREATMENT   Patient Name: Cynthia Welch MRN: [id number] DOB:06-17-1961, 62 y.o., female Today's Date: 04/23/2023   PCP: Cynthia Crocker, MD REFERRING PROVIDER: Dickie La, MD  END OF SESSION:  PT End of Session - 04/23/23 4098     Visit Number 2    Number of Visits 7   with eval   Date for PT Re-Evaluation 06/18/23   to allow for scheduling delays   Authorization Type Medicaid [facility]    Authorization Time Period Approved 8 PT visits 04/23/2023-06/22/2023    Authorization - Number of Visits 8   approved 8 visits 04/23/23-06/22/23   PT Start Time 0849    PT Stop Time 0933    PT Time Calculation (min) 44 min    Equipment Utilized During Treatment --   pt refused gait belt   Activity Tolerance Patient tolerated treatment well    Behavior During Therapy WFL for tasks assessed/performed              Past Medical History:  Diagnosis Date   Anxiety    Childhood asthma    Depression    Fibroids    GERD (gastroesophageal reflux disease)    Headache    "monthly" (05/15/2017)   High cholesterol    Hypertension    Pneumonia    "twice when I was young" (05/15/2017)   PONV (postoperative nausea and vomiting)    in the past "used to get nauseous"   S/P TAH (total abdominal hysterectomy) 09/07/2015   Seizures (HCC) last sz 04/2017   "I've had grand mal; usually hit the floor and wake up w/in a minute or so;last one was today, 05/15/2017"   Subdural hematoma (HCC) 01/29/2013   Per MRI 01/2013; "had a seizure and busted my head open"   Past Surgical History:  Procedure Laterality Date   ABDOMINAL HYSTERECTOMY N/A 09/07/2015   Procedure: HYSTERECTOMY ABDOMINAL, CYSTOTOMY WITH BLADDER REPAIR;  Surgeon: Cynthia Antigua, MD;  Location: [facility];  Service: Gynecology;  Laterality: N/A;  Requested 09/07/15 @ 2:15p   BILATERAL SALPINGECTOMY Bilateral 09/07/2015   Procedure: BILATERAL SALPINGECTOMY;  Surgeon: Cynthia Antigua, MD;  Location: [facility];   Service: Gynecology;  Laterality: Bilateral;   CHOLECYSTECTOMY N/A 06/08/2017   Procedure: LAPAROSCOPIC CHOLECYSTECTOMY WITH INTRAOPERATIVE CHOLANGIOGRAM;  Surgeon: Cynthia Rudd, MD;  Location: [facility] OR;  Service: General;  Laterality: N/A;   DIAGNOSTIC LAPAROSCOPY     DILATION AND CURETTAGE OF UTERUS     ESOPHAGOGASTRODUODENOSCOPY N/A 10/05/2016   Procedure: ESOPHAGOGASTRODUODENOSCOPY (EGD);  Surgeon: Cynthia Rakes, MD;  Location: [facility] ENDOSCOPY;  Service: Endoscopy;  Laterality: N/A;   FRACTURE SURGERY     ORIF ULNAR FRACTURE  09/03/2011   Procedure: OPEN REDUCTION INTERNAL FIXATION (ORIF) ULNAR FRACTURE;  Surgeon: Cynthia Welch;  Location: [facility] OR;  Service: Orthopedics;  Laterality: Right;   ORIF ULNAR FRACTURE  12/14/2011   Procedure: OPEN REDUCTION INTERNAL FIXATION (ORIF) ULNAR FRACTURE;  Surgeon: Cynthia Copa, MD;  Location: [facility] OR;  Service: Orthopedics;  Laterality: Right;  Right ulnar shaft open reduction, internal fixation with right iliac crest bone graft   TONSILLECTOMY     Patient Active Problem List   Diagnosis Date Noted   Foot pain, right 04/03/2023   Tremor 04/03/2023   Neuropathic pain of forearm, right 11/14/2022   Spasms of the hands or feet 05/31/2022   Back pain 02/14/2022   Healthcare maintenance 05/01/2019   Macrocytosis without anemia 06/05/2017   Esophageal reflux 10/05/2016   Hypokalemia 01/29/2013  Seizure disorder (HCC) 08/10/2010   Tobacco use disorder 11/19/2008   HYPERCHOLESTEROLEMIA 10/31/2007   Hypertension 10/30/2007    ONSET DATE: 04/03/2023  REFERRING DIAG: R25.1 (ICD-10-CM) - Tremor  THERAPY DIAG:  Unsteadiness on feet  Other abnormalities of gait and mobility  Other lack of coordination  Rationale for Evaluation and Treatment: Rehabilitation  SUBJECTIVE:                                                                                                                                                                                              SUBJECTIVE STATEMENT:  "Cynthia Welch"  Pt reports she had an issue last night, got warm and then shaky so she is "off" today. No new falls or acute changes since last visit.   Pt with referral to neurology and is scheduled with Dr. Terrace Arabia at [facility] on 07/06/23. She is on a wait-list to get in sooner if able.   Pt accompanied by: self and husband in lobby   PERTINENT HISTORY:  Anxiety, asthma, depression, GERD, hypertension, seizures, subdural hematoma (2014)  PAIN:  Are you having pain? No  PRECAUTIONS: Fall and Other: seizure  WEIGHT BEARING RESTRICTIONS: No  FALLS: Has patient fallen in last 6 months? Yes. Number of falls 2-3; frequent falls into walls; recent injury to R ankle with fall  LIVING ENVIRONMENT: Lives with: lives with their partner (boyfriend, Therapist, sports) Lives in: House/apartment Stairs: Yes: External: 5 steps; none (pt does avoid stairs due to decreased safety) Has following equipment at home: Single point cane and Walker - 2 wheeled  PLOF: Independent with gait and Independent with transfers  PATIENT GOALS: "to feel better, to be able to move around"  OBJECTIVE:   DIAGNOSTIC FINDINGS:  R ankle xray 04/03/23 IMPRESSION: 1. No acute fracture or dislocation of the right ankle. 2. Possible small ankle joint effusion.  R foot xray 04/03/23 IMPRESSION: No acute bony abnormalities.  COGNITION: Overall cognitive status: Within functional limits for tasks assessed   SENSATION: N/T in hands and feet  COORDINATION: UE: Rapid alternating movements: WFL Finger to nose: dysmetric, LUE worse than RUE LE: Slightly dysmetric heel to shin  POSTURE: rounded shoulders and forward head  LOWER EXTREMITY ROM:     Active  Right Eval Left Eval  Hip flexion    Hip extension    Hip abduction    Hip adduction    Hip internal rotation    Hip external rotation    Knee flexion    Knee extension Tight HS Tight HS  Ankle dorsiflexion    Ankle plantarflexion     Ankle inversion    Ankle eversion     (  Blank rows = not tested)  LOWER EXTREMITY MMT:    MMT Right Eval Left Eval  Hip flexion 5 4  Hip extension    Hip abduction    Hip adduction    Hip internal rotation    Hip external rotation    Knee flexion 5 3  Knee extension 5 3  Ankle dorsiflexion 4 3  Ankle plantarflexion    Ankle inversion    Ankle eversion    (Blank rows = not tested)  VITALS  Vitals:   04/23/23 0904 04/23/23 0917  BP: 139/88 (!) 146/90  Pulse: (!) 105 (!) 131     TODAY'S TREATMENT:     Ther Act  Assessed vitals (see above) and diastolic initially very elevated. Pt reports this is likely due to her "episode" last night. Pt states she does not check her BP when her "episodes" occur so encouraged pt to assess BP and keep a BP log to bring to neurology appointment. Pt verbalized understanding. Reassessed BP and continued to monitor throughout. BP WNL for remainder of session but HR elevated.   NMR Established initial HEP for improved BLE strength, LE coordination and core stability: Seated march overs using 10# KB, x8 per side. Pt performed well on R side but exhibited inconsistent dysmetric movement of LLE.  Quadruped bird dogs on mat w/min A at pelvis for balance, x5 per side. Pt required mod cues for proper sequencing throughout  In // bars, alt toe taps to random colored cones, x12 reps, for improved side stepping, LE coordination and BLE strength. Noted significant dysmetria of LLE when tapping cone that dissipated when stepping. Progressed to 8 reps w/2# ankle weights added to BLEs, no notable change in performance noted.    TRANSFERS: Assistive device utilized: None  Sit to stand: Modified independence Stand to sit: Modified independence Chair to chair: Modified independence   GAIT: Gait pattern: decreased hip/knee flexion- Right, decreased hip/knee flexion- Left, decreased ankle dorsiflexion- Right, decreased ankle dorsiflexion- Left, scissoring,  ataxic, and narrow BOS Distance walked: various clinic distances Assistive device utilized: None Level of assistance: SBA Comments: very ataxic, occasional scissoring of limbs, no LOB but frequent stumbling    PATIENT EDUCATION: Education details: Initial HEP, starting BP log  Person educated: Patient Education method: Programmer, multimedia, Demonstration, and Handouts Education comprehension: verbalized understanding, verbal cues required, and needs further education  HOME EXERCISE PROGRAM: Access Code: [email or web address] URL: [email or web address] Date: 04/23/2023 Prepared by: Alethia Berthold Mahnoor Mathisen  Exercises - Seated march over  - 1 x daily - 7 x weekly - 3 sets - 10 reps - Bird Dog  - 1 x daily - 7 x weekly - 2 sets - 10 reps  GOALS: Goals reviewed with patient? Yes  SHORT TERM GOALS: Target date: 04/30/2023  Pt will be independent with initial HEP for improved strength, balance, transfers and gait. Baseline: Goal status: INITIAL  2.  Pt will improve 5 x STS to less than or equal to 18 seconds to demonstrate improved functional strength and transfer efficiency.  Baseline: 21.22 sec (6/17) Goal status: INITIAL  3.  Pt will improve FGA to 15/30 for decreased fall risk  Baseline: 12/30 (6/17) Goal status: INITIAL  4.  SARA to be assessed and STG set Baseline:  Goal status: INITIAL   LONG TERM GOALS: Target date: 05/28/2023    Pt will be independent with final HEP for improved strength, balance, transfers and gait. Baseline:  Goal status: INITIAL  2.  Pt will improve 5 x STS to  less than or equal to 15 seconds to demonstrate improved functional strength and transfer efficiency.  Baseline: 21.22 sec (6/17) Goal status: INITIAL  3.  Pt will improve FGA to 18/30 for decreased fall risk  Baseline: 12/30 (6/17) Goal status: INITIAL  4.  SARA to be assessed and LTG set Baseline:  Goal status: INITIAL   ASSESSMENT:  CLINICAL IMPRESSION: Emphasis of skilled PT session on  establishing initial HEP, pt education on BP parameters and LE coordination. Pt reports she had an "episode" last night and is "off" today, w/diastolic BP initially too elevated to safely participate in therapy but did reduce after prolonged seated rest. Encouraged pt to assess vitals at home when having these episodes and keep a BP log to provide to neurologist. Pt demonstrating inconsistent essential tremors of BUEs this date. Alternating between R and LUE. Pt w/significant dysmetria of LLE throughout session, which pt reports she cannot control. Continue POC.    OBJECTIVE IMPAIRMENTS: Abnormal gait, decreased balance, decreased coordination, decreased knowledge of condition, decreased mobility, difficulty walking, decreased ROM, decreased strength, impaired perceived functional ability, increased muscle spasms, impaired sensation, impaired UE functional use, and pain.   ACTIVITY LIMITATIONS: carrying, lifting, bending, standing, squatting, stairs, and transfers  PARTICIPATION LIMITATIONS: meal prep, cleaning, driving, community activity, and occupation  PERSONAL FACTORS: Time since onset of injury/illness/exacerbation and 1-2 comorbidities:    Anxiety, asthma, depression, GERD, hypertension, seizures, subdural hematoma (2014)are also affecting patient's functional outcome.   REHAB POTENTIAL: Fair time since onset; unexplained origin of symptoms  CLINICAL DECISION MAKING: Stable/uncomplicated  EVALUATION COMPLEXITY: Moderate  PLAN:  PT FREQUENCY: 1-2x/week  PT DURATION: 6 weeks  PLANNED INTERVENTIONS: Therapeutic exercises, Therapeutic activity, Neuromuscular re-education, Balance training, Gait training, Patient/Family education, Self Care, Joint mobilization, Stair training, Vestibular training, Canalith repositioning, Visual/preceptual remediation/compensation, Orthotic/Fit training, DME instructions, Aquatic Therapy, Dry Needling, Electrical stimulation, Cryotherapy, Moist heat, Manual  therapy, and Re-evaluation  PLAN FOR NEXT SESSION: assess SARA and write goal, initiate HEP for LE strengthening and balance, work on improving control of ataxic movements (weighted vest, weights on limbs), quadruped   Jaki Hammerschmidt E Detrich Rakestraw, PT, DPT 04/23/2023, 10:18 AM

## 2023-04-24 ENCOUNTER — Ambulatory Visit: Payer: Medicaid Other | Admitting: Occupational Therapy

## 2023-04-24 DIAGNOSIS — M6281 Muscle weakness (generalized): Secondary | ICD-10-CM | POA: Diagnosis not present

## 2023-04-24 DIAGNOSIS — R2681 Unsteadiness on feet: Secondary | ICD-10-CM | POA: Diagnosis not present

## 2023-04-24 DIAGNOSIS — R278 Other lack of coordination: Secondary | ICD-10-CM

## 2023-04-24 DIAGNOSIS — R251 Tremor, unspecified: Secondary | ICD-10-CM | POA: Diagnosis not present

## 2023-04-24 DIAGNOSIS — R2689 Other abnormalities of gait and mobility: Secondary | ICD-10-CM | POA: Diagnosis not present

## 2023-04-24 DIAGNOSIS — R296 Repeated falls: Secondary | ICD-10-CM | POA: Diagnosis not present

## 2023-04-24 NOTE — Therapy (Signed)
OUTPATIENT OCCUPATIONAL THERAPY NEURO TREATMENT  Patient Name: Cynthia Welch [name]") MRN: [id number] DOB:26-Dec-1960, 62 y.o., female Today's Date: 04/24/2023  PCP: Morene Crocker, MD REFERRING PROVIDER: Dickie La, MD  END OF SESSION:  OT End of Session - 04/24/23 0846     Visit Number 2    Number of Visits 9    Date for OT Re-Evaluation 06/16/23    Authorization Type [employer] - auth required/submitted    OT Start Time 0847    OT Stop Time 0930    OT Time Calculation (min) 43 min    Activity Tolerance Patient tolerated treatment well    Behavior During Therapy WFL for tasks assessed/performed             Past Medical History:  Diagnosis Date   Anxiety    Childhood asthma    Depression    Fibroids    GERD (gastroesophageal reflux disease)    Headache    "monthly" (05/15/2017)   High cholesterol    Hypertension    Pneumonia    "twice when I was young" (05/15/2017)   PONV (postoperative nausea and vomiting)    in the past "used to get nauseous"   S/P TAH (total abdominal hysterectomy) 09/07/2015   Seizures (HCC) last sz 04/2017   "I've had grand mal; usually hit the floor and wake up w/in a minute or so;last one was today, 05/15/2017"   Subdural hematoma (HCC) 01/29/2013   Per MRI 01/2013; "had a seizure and busted my head open"   Past Surgical History:  Procedure Laterality Date   ABDOMINAL HYSTERECTOMY N/A 09/07/2015   Procedure: HYSTERECTOMY ABDOMINAL, CYSTOTOMY WITH BLADDER REPAIR;  Surgeon: Catalina Antigua, MD;  Location: [facility];  Service: Gynecology;  Laterality: N/A;  Requested 09/07/15 @ 2:15p   BILATERAL SALPINGECTOMY Bilateral 09/07/2015   Procedure: BILATERAL SALPINGECTOMY;  Surgeon: Catalina Antigua, MD;  Location: [facility];  Service: Gynecology;  Laterality: Bilateral;   CHOLECYSTECTOMY N/A 06/08/2017   Procedure: LAPAROSCOPIC CHOLECYSTECTOMY WITH INTRAOPERATIVE CHOLANGIOGRAM;  Surgeon: Manus Rudd, MD;  Location: [facility] OR;  Service: General;   Laterality: N/A;   DIAGNOSTIC LAPAROSCOPY     DILATION AND CURETTAGE OF UTERUS     ESOPHAGOGASTRODUODENOSCOPY N/A 10/05/2016   Procedure: ESOPHAGOGASTRODUODENOSCOPY (EGD);  Surgeon: Charlott Rakes, MD;  Location: [facility];  Service: Endoscopy;  Laterality: N/A;   FRACTURE SURGERY     ORIF ULNAR FRACTURE  09/03/2011   Procedure: OPEN REDUCTION INTERNAL FIXATION (ORIF) ULNAR FRACTURE;  Surgeon: Cammy Copa;  Location: [facility] OR;  Service: Orthopedics;  Laterality: Right;   ORIF ULNAR FRACTURE  12/14/2011   Procedure: OPEN REDUCTION INTERNAL FIXATION (ORIF) ULNAR FRACTURE;  Surgeon: Cammy Copa, MD;  Location: [facility] OR;  Service: Orthopedics;  Laterality: Right;  Right ulnar shaft open reduction, internal fixation with right iliac crest bone graft   TONSILLECTOMY     Patient Active Problem List   Diagnosis Date Noted   Foot pain, right 04/03/2023   Tremor 04/03/2023   Neuropathic pain of forearm, right 11/14/2022   Spasms of the hands or feet 05/31/2022   Back pain 02/14/2022   Healthcare maintenance 05/01/2019   Macrocytosis without anemia 06/05/2017   Esophageal reflux 10/05/2016   Hypokalemia 01/29/2013   Seizure disorder (HCC) 08/10/2010   Tobacco use disorder 11/19/2008   HYPERCHOLESTEROLEMIA 10/31/2007   Hypertension 10/30/2007    ONSET DATE: 04/03/2023  REFERRING DIAG: R25.1 (ICD-10-CM) - Tremor  THERAPY DIAG:  Other lack of coordination  Tremor  Muscle weakness (generalized)  Rationale for Evaluation and Treatment: Rehabilitation  SUBJECTIVE:   SUBJECTIVE STATEMENT:     Patient feels like she just has no grip in both hands and she has stopped using utensils very much with eating and puts things in cups to 'drink' or bring to her mouth.  She also notes that it is hard to hold a pen.   Pt with referral to neurology and is scheduled with Dr. Terrace Arabia at [facility] on 07/06/23. She is on a wait-list to get in sooner if able.  Pt accompanied by: self  PERTINENT  HISTORY: Anxiety, asthma, depression, GERD, hypertension, seizures, subdural hematoma (2014)   PER MD NOTE:  Tremor Patient recently seen in clinic on 6/4 for bilateral hand and lower extremity spasms noted to have a generalized tremor both at rest and with movement.    During examination, tremor was noted in head, voice. EOM intact without nystagmus/ Hands and torso tremor at rest and postural. No pill rolling, bradikinesia, or rigidity.    PAIN:  Are you having pain? NA upon arrival d/t not doing anything yet and using Volatren gel in the morning - Spasms increase her pain to 10/10 Pain location: hands, arms, legs Pain description: spasms ie) like a pulling muscle especially along the ulnar side of her phalange Aggravating factors: pumping containers Relieving factors: Voltaren gel, heat blanket (at nighttime)   PRECAUTIONS: Fall and Other: seizure   WEIGHT BEARING RESTRICTIONS: No   FALLS: Has patient fallen in last 6 months? Yes. Number of falls 2-3; frequent falls into walls; recent injury to R ankle with fall   LIVING ENVIRONMENT: Lives with: lives with their partner (boyfriend, Therapist, sports) Lives in: House/apartment Stairs: Yes: External: 5 steps; none (pt does avoid stairs due to decreased safety) Has following equipment at home: Single point cane and Walker - 2 wheeled   PLOF: Independent with BADLS, gait and Independent with transfers.  Was working full time but unable now   PATIENT GOALS: "to feel better, to be able to move around"   OBJECTIVE:    DIAGNOSTIC FINDINGS:  R ankle xray 04/03/23 IMPRESSION: 1. No acute fracture or dislocation of the right ankle. 2. Possible small ankle joint effusion.   R foot xray 04/03/23 IMPRESSION: No acute bony abnormalities.  HAND DOMINANCE: Right  ADLs: Overall ADLs: Transfers/ambulation related to ADLs: Eating: normally ok unless tremors bad Grooming: normally ok unless tremors bad UB Dressing: mod I  LB Dressing: mod I   Toileting: independent Bathing: mod I  Tub Shower transfers: normally ok Equipment: none  IADLs: Shopping: Boyfriend does mostly Light housekeeping: Mod I when not having the episodes of tremors Meal Prep: fluctuating assistance Community mobility: relies on boyfriend d/t tremors  Medication management: independent Handwriting:  75-90% w/ mild shaking  MOBILITY STATUS: Independent and uses cane or walker only occasionally   FUNCTIONAL OUTCOME MEASURES: Upper Extremity Functional Scale (UEFS): 37.5%  UPPER EXTREMITY ROM:  BUE AROM WNL's w/ mild shaking   UPPER EXTREMITY MMT:   BUE MMT grossly 3+/5 at shoulders   HAND FUNCTION: Grip strength: Right: 24.9 lbs; Left: 18.2 lbs (with great effort)   COORDINATION: 9 Hole Peg test: Right: 33.54 sec; Left: 37.92 sec (with shaking, ataxia)  SENSATION: Fingertips feel dulled bilateral hands  EDEMA: noted mild MP joint swelling Rt hand d/t arthritis?    COGNITION: Overall cognitive status:  Pt reports she forgets things  VISION: Subjective report: no changes, I've worn glasses since I was born Baseline vision: Wears glasses all the  time and Legally blind Visual history:  pt reports she's legally blind  PERCEPTION: Not tested  PRAXIS: Not tested  OBSERVATIONS: Pt reports dropping things a lot from Rt hand more than Lt hand   TODAY'S TREATMENT:                                                                                                                               Therapeutic Activities:    Coordination Exercise handout with images provided for various activities to work on B UE finger ROM, and coordinaiton with demonstration and practice, as well as modification, hand over hand guidance and cues throughout to minimize frustration.  Patient also engaged in completing table top activities with arms resting on table top for stability or with weight around her wrist or hand weights on her hands.     Picking up coins,  dominoes, buttons, marbles, dried beans/pasta of different sizes ... To place in containers To stack -- She has difficulty with coins and is encouraged to use checkers or Connect 4 pieces. To pick up items one at a time until she gets 5+ in his hand and then move items from palm to fingertips to release.  --She needed extra time and increased guidance for in hand manipulation of various objects  Flip cards 1 at a time. -- Setup patient to work on sorting cards for with hand weight applied to dorsal aspect of hand, focusing on stabilizing arm and flipping cards with decreased tremors as task progressed.   Introduced the other activities listed on handout (# 4, 5, 6) but encouraged her to focus on the items above ie) #1, 2, 3, 7   Tear a piece of paper towel and roll it into small balls.   Rotating balls in hand. Twirl pen between fingers.  She is encourage to take breaks, stabilize arms as needed, minimize compensatory motions and a try different activities throughout the week.  Also introduced weighted utensils as an option to help with self feeding (without just drinking/eating from a cup).  PATIENT EDUCATION: Education details: Psychiatrist Person educated: Patient Education method: Explanation, Demonstration, Tactile cues, Verbal cues, and Handouts Education comprehension: verbalized understanding, returned demonstration, verbal cues required, tactile cues required, and needs further education  HOME EXERCISE PROGRAM: 04/24/23 - coordination exercises with images   GOALS: Goals reviewed with patient? Yes  SHORT TERM GOALS: Target date: 05/16/23  Independent with coordination and putty HEP  Baseline: Not yet issued/addressed Goal status: IN PROGRESS 04/24/23 - Coordination Activities with images provided  2.  Pt to verbalize understanding with safety strategies and A/E to promote safety and prevent drops/spills Baseline: Not yet issued/addressed Goal status: IN  PROGRESS 04/24/23 - discussed weighted utensils  3.  Independent with BUE strengthening HEP  Baseline: Not yet issued/addressed Goal status: IN PROGRESS   LONG TERM GOALS: Target date: 06/16/23  Pt to improve grip strength bilaterally by 5 lbs or more Baseline: Rt =  24, Lt = 18 Goal status: INITIAL  2.  Pt to improve coordination as evidenced by reducing speed on 9 hole peg test 5 sec. Baseline: Rt = 33 sec, Lt = 37 sec Goal status: IN PROGRESS  3.  Pt to improve overall UE use as evidenced by increase on UEFI scale to 45% or higher Baseline: 37.5% Goal status: INITIAL    ASSESSMENT:  CLINICAL IMPRESSION:  Patient is a 62 y.o. female who was seen today for occupational therapy treatment for tremor. Pt has decreased grip strength, coordination deficits, and drops things with both hands as a result of tremors. Pt seemed to benefit from trials of weight (wrist or hand) to help stabilize her hands and improve coordination.  She also seemed to have tremors decreased over progression of an activities ie) less at end of sorting cards then a the beginning. Pt will benefit from ongoing O.T. to address deficits as a result of tremors and to address task modifications and possible A/E needs.   PERFORMANCE DEFICITS: in functional skills including ADLs, IADLs, coordination, dexterity, sensation, strength, pain, muscle spasms, Fine motor control, Gross motor control, mobility, body mechanics, decreased knowledge of use of DME, and UE functional use.   IMPAIRMENTS: are limiting patient from ADLs, IADLs, rest and sleep, work, and leisure.   CO-MORBIDITIES: may have co-morbidities  that affects occupational performance. Patient will benefit from skilled OT to address above impairments and improve overall function.  REHAB POTENTIAL: Good   PLAN:  OT FREQUENCY: 1x/week  OT DURATION: 8 weeks (plus eval)   PLANNED INTERVENTIONS: self care/ADL training, therapeutic exercise, therapeutic activity,  neuromuscular re-education, manual therapy, passive range of motion, functional mobility training, splinting, paraffin, fluidotherapy, moist heat, cryotherapy, contrast bath, patient/family education, energy conservation, coping strategies training, and DME and/or AE instructions  RECOMMENDED OTHER SERVICES: none at this time  CONSULTED AND AGREED WITH PLAN OF CARE: Patient  PLAN FOR NEXT SESSION:  Review coordination activities & progress as able. Add putty HEP  Tremor Handout/Education Weighted utensil trial.  Victorino Sparrow, OT 04/24/2023, 1:16 PM

## 2023-04-30 ENCOUNTER — Ambulatory Visit: Payer: Medicaid Other | Admitting: Physical Therapy

## 2023-04-30 VITALS — BP 118/83 | HR 105

## 2023-04-30 NOTE — Therapy (Signed)
OUTPATIENT PHYSICAL THERAPY NEURO TREATMENT- ARRIVED NO CHARGE   Patient Name: Cynthia Welch MRN: [id number] DOB:Mar 09, 1961, 62 y.o., female Today's Date: 04/30/2023   PCP: Morene Crocker, MD REFERRING PROVIDER: Dickie La, MD  END OF SESSION:  PT End of Session - 04/30/23 5366     Visit Number 2   Arrived no charge   Number of Visits 7   with eval   Date for PT Re-Evaluation 06/18/23   to allow for scheduling delays   Authorization Type Medicaid [facility]    Authorization Time Period Approved 8 PT visits 04/23/2023-06/22/2023    Authorization - Number of Visits 8   approved 8 visits 04/23/23-06/22/23   PT Start Time 0851   Pt arrived late   PT Stop Time 0913   Arrived no charge   PT Time Calculation (min) 22 min    Equipment Utilized During Treatment Gait belt    Activity Tolerance Patient limited by fatigue;Patient limited by pain    Behavior During Therapy WFL for tasks assessed/performed              Past Medical History:  Diagnosis Date   Anxiety    Childhood asthma    Depression    Fibroids    GERD (gastroesophageal reflux disease)    Headache    "monthly" (05/15/2017)   High cholesterol    Hypertension    Pneumonia    "twice when I was young" (05/15/2017)   PONV (postoperative nausea and vomiting)    in the past "used to get nauseous"   S/P TAH (total abdominal hysterectomy) 09/07/2015   Seizures (HCC) last sz 04/2017   "I've had grand mal; usually hit the floor and wake up w/in a minute or so;last one was today, 05/15/2017"   Subdural hematoma (HCC) 01/29/2013   Per MRI 01/2013; "had a seizure and busted my head open"   Past Surgical History:  Procedure Laterality Date   ABDOMINAL HYSTERECTOMY N/A 09/07/2015   Procedure: HYSTERECTOMY ABDOMINAL, CYSTOTOMY WITH BLADDER REPAIR;  Surgeon: Catalina Antigua, MD;  Location: [facility];  Service: Gynecology;  Laterality: N/A;  Requested 09/07/15 @ 2:15p   BILATERAL SALPINGECTOMY Bilateral 09/07/2015   Procedure:  BILATERAL SALPINGECTOMY;  Surgeon: Catalina Antigua, MD;  Location: [facility];  Service: Gynecology;  Laterality: Bilateral;   CHOLECYSTECTOMY N/A 06/08/2017   Procedure: LAPAROSCOPIC CHOLECYSTECTOMY WITH INTRAOPERATIVE CHOLANGIOGRAM;  Surgeon: Manus Rudd, MD;  Location: [facility] OR;  Service: General;  Laterality: N/A;   DIAGNOSTIC LAPAROSCOPY     DILATION AND CURETTAGE OF UTERUS     ESOPHAGOGASTRODUODENOSCOPY N/A 10/05/2016   Procedure: ESOPHAGOGASTRODUODENOSCOPY (EGD);  Surgeon: Charlott Rakes, MD;  Location: [facility] ENDOSCOPY;  Service: Endoscopy;  Laterality: N/A;   FRACTURE SURGERY     ORIF ULNAR FRACTURE  09/03/2011   Procedure: OPEN REDUCTION INTERNAL FIXATION (ORIF) ULNAR FRACTURE;  Surgeon: Cammy Copa;  Location: [facility] OR;  Service: Orthopedics;  Laterality: Right;   ORIF ULNAR FRACTURE  12/14/2011   Procedure: OPEN REDUCTION INTERNAL FIXATION (ORIF) ULNAR FRACTURE;  Surgeon: Cammy Copa, MD;  Location: [facility] OR;  Service: Orthopedics;  Laterality: Right;  Right ulnar shaft open reduction, internal fixation with right iliac crest bone graft   TONSILLECTOMY     Patient Active Problem List   Diagnosis Date Noted   Foot pain, right 04/03/2023   Tremor 04/03/2023   Neuropathic pain of forearm, right 11/14/2022   Spasms of the hands or feet 05/31/2022   Back pain 02/14/2022   Healthcare maintenance 05/01/2019   Macrocytosis  without anemia 06/05/2017   Esophageal reflux 10/05/2016   Hypokalemia 01/29/2013   Seizure disorder (HCC) 08/10/2010   Tobacco use disorder 11/19/2008   HYPERCHOLESTEROLEMIA 10/31/2007   Hypertension 10/30/2007    ONSET DATE: 04/03/2023  REFERRING DIAG: R25.1 (ICD-10-CM) - Tremor  THERAPY DIAG:  Other lack of coordination  Muscle weakness (generalized)  Unsteadiness on feet  Rationale for Evaluation and Treatment: Rehabilitation  SUBJECTIVE:                                                                                                                                                                                              SUBJECTIVE STATEMENT:  "[name]"  Pt reports she had a rough weekend, had an episode on Friday and spent all weekend in bed. Threw up all day Saturday, has not been able to keep anything down. Cannot sleep when this happens. Having pain in her back and ribs from "constant heaving". Stayed in bed all weekend as she was afraid of falling.   Pt with referral to neurology and is scheduled with Dr. Terrace Arabia at [facility] on 07/06/23. She is on a wait-list to get in sooner if able.   Pt accompanied by: self and husband in lobby   PERTINENT HISTORY:  Anxiety, asthma, depression, GERD, hypertension, seizures, subdural hematoma (2014)  PAIN:  Are you having pain? Yes: NPRS scale: 8/10 Pain location: low back and ribs Pain description: Achy  PRECAUTIONS: Fall and Other: seizure  WEIGHT BEARING RESTRICTIONS: No  FALLS: Has patient fallen in last 6 months? Yes. Number of falls 2-3; frequent falls into walls; recent injury to R ankle with fall  LIVING ENVIRONMENT: Lives with: lives with their partner (boyfriend, Therapist, sports) Lives in: House/apartment Stairs: Yes: External: 5 steps; none (pt does avoid stairs due to decreased safety) Has following equipment at home: Single point cane and Walker - 2 wheeled  PLOF: Independent with gait and Independent with transfers  PATIENT GOALS: "to feel better, to be able to move around"  OBJECTIVE:   DIAGNOSTIC FINDINGS:  R ankle xray 04/03/23 IMPRESSION: 1. No acute fracture or dislocation of the right ankle. 2. Possible small ankle joint effusion.  R foot xray 04/03/23 IMPRESSION: No acute bony abnormalities.  COGNITION: Overall cognitive status: Within functional limits for tasks assessed   SENSATION: N/T in hands and feet  COORDINATION: UE: Rapid alternating movements: WFL Finger to nose: dysmetric, LUE worse than RUE LE: Slightly dysmetric heel to shin  POSTURE: rounded  shoulders and forward head  LOWER EXTREMITY ROM:     Active  Right Eval Left Eval  Hip flexion    Hip extension  Hip abduction    Hip adduction    Hip internal rotation    Hip external rotation    Knee flexion    Knee extension Tight HS Tight HS  Ankle dorsiflexion    Ankle plantarflexion    Ankle inversion    Ankle eversion     (Blank rows = not tested)  LOWER EXTREMITY MMT:    MMT Right Eval Left Eval  Hip flexion 5 4  Hip extension    Hip abduction    Hip adduction    Hip internal rotation    Hip external rotation    Knee flexion 5 3  Knee extension 5 3  Ankle dorsiflexion 4 3  Ankle plantarflexion    Ankle inversion    Ankle eversion    (Blank rows = not tested)  VITALS  Vitals:   04/30/23 0858  BP: 118/83  Pulse: (!) 105  SpO2: 99%      TODAY'S TREATMENT:    ARRIVED NO CHARGE Ther Act  Assessed vitals (see above) and pt's HR continues to be more elevated. Closely monitored throughout session.   STG Assessment   [facility] PT Assessment - 04/30/23 0906       Transfers   Five time sit to stand comments  23.06s w/BUE support      Functional Gait  Assessment   Gait assessed  Yes    Gait Level Surface Walks 20 ft in less than 7 sec but greater than 5.5 sec, uses assistive device, slower speed, mild gait deviations, or deviates 6-10 in outside of the 12 in walkway width.   6.62s   Change in Gait Speed Makes only minor adjustments to walking speed, or accomplishes a change in speed with significant gait deviations, deviates 10-15 in outside the 12 in walkway width, or changes speed but loses balance but is able to recover and continue walking.    Gait with Horizontal Head Turns Performs head turns with moderate changes in gait velocity, slows down, deviates 10-15 in outside 12 in walkway width but recovers, can continue to walk.   min A   Gait with Vertical Head Turns Performs task with severe disruption of gait (eg, staggers 15 in outside 12 in walkway  width, loses balance, stops, reaches for wall).   min A   Gait and Pivot Turn Turns slowly, requires verbal cueing, or requires several small steps to catch balance following turn and stop            Due to pt's lethargy, pain and decline in function, patient in agreement to end session early and go home. Pt reported 6/10 dizziness and required min A throughout goal assessment due to BLE tremors and scissoring of gait. Will reassess goals next week.    GAIT: Gait pattern: decreased hip/knee flexion- Right, decreased hip/knee flexion- Left, decreased ankle dorsiflexion- Right, decreased ankle dorsiflexion- Left, scissoring, ataxic, and narrow BOS Distance walked: various clinic distances Assistive device utilized: None Level of assistance: CGA and Min A Comments: very ataxic, frequent scissoring of limbs and posterior LOB    PATIENT EDUCATION: Education details: Continue HEP, plan for next session  Person educated: Patient Education method: Explanation, Demonstration, and Handouts Education comprehension: verbalized understanding, verbal cues required, and needs further education  HOME EXERCISE PROGRAM: Access Code: [email or web address] URL: [email or web address] Date: 04/23/2023 Prepared by: Alethia Berthold Princetta Uplinger  Exercises - Seated march over  - 1 x daily - 7 x weekly - 3 sets - 10 reps - Bird Dog  - 1  x daily - 7 x weekly - 2 sets - 10 reps  GOALS: Goals reviewed with patient? Yes  SHORT TERM GOALS: Target date: 04/30/2023  Pt will be independent with initial HEP for improved strength, balance, transfers and gait. Baseline: Goal status: MET  2.  Pt will improve 5 x STS to less than or equal to 18 seconds to demonstrate improved functional strength and transfer efficiency.  Baseline: 21.22 sec (6/17); 23.06s w/BUE support (7/8) Goal status: NOT MET  3.  Pt will improve FGA to 15/30 for decreased fall risk  Baseline: 12/30 (6/17) Goal status: INITIAL  4.  SARA to be  assessed and STG set Baseline:  Goal status: DC - not assessed prior to STG assessment    LONG TERM GOALS: Target date: 05/28/2023    Pt will be independent with final HEP for improved strength, balance, transfers and gait. Baseline:  Goal status: INITIAL  2.  Pt will improve 5 x STS to less than or equal to 15 seconds to demonstrate improved functional strength and transfer efficiency.  Baseline: 21.22 sec (6/17) Goal status: INITIAL  3.  Pt will improve FGA to 18/30 for decreased fall risk  Baseline: 12/30 (6/17) Goal status: INITIAL  4.  SARA to be assessed and LTG set Baseline:  Goal status: INITIAL   ASSESSMENT:  CLINICAL IMPRESSION: Arrived no charge due to pt fatigue, tremors, dehydration, dizziness and pain. Pt's BP WNL this date but resting HR continues to be elevated. Pt in agreement to leave PT session early as she does not feel good and is not able to tolerate activity. Will assess goals next session.    OBJECTIVE IMPAIRMENTS: Abnormal gait, decreased balance, decreased coordination, decreased knowledge of condition, decreased mobility, difficulty walking, decreased ROM, decreased strength, impaired perceived functional ability, increased muscle spasms, impaired sensation, impaired UE functional use, and pain.   ACTIVITY LIMITATIONS: carrying, lifting, bending, standing, squatting, stairs, and transfers  PARTICIPATION LIMITATIONS: meal prep, cleaning, driving, community activity, and occupation  PERSONAL FACTORS: Time since onset of injury/illness/exacerbation and 1-2 comorbidities:    Anxiety, asthma, depression, GERD, hypertension, seizures, subdural hematoma (2014)are also affecting patient's functional outcome.   REHAB POTENTIAL: Fair time since onset; unexplained origin of symptoms  CLINICAL DECISION MAKING: Stable/uncomplicated  EVALUATION COMPLEXITY: Moderate  PLAN:  PT FREQUENCY: 1-2x/week  PT DURATION: 6 weeks  PLANNED INTERVENTIONS: Therapeutic  exercises, Therapeutic activity, Neuromuscular re-education, Balance training, Gait training, Patient/Family education, Self Care, Joint mobilization, Stair training, Vestibular training, Canalith repositioning, Visual/preceptual remediation/compensation, Orthotic/Fit training, DME instructions, Aquatic Therapy, Dry Needling, Electrical stimulation, Cryotherapy, Moist heat, Manual therapy, and Re-evaluation  PLAN FOR NEXT SESSION: initiate HEP for LE strengthening and balance, work on improving control of ataxic movements (weighted vest, weights on limbs), quadruped   Cena Bruhn E Albaraa Swingle, PT, DPT 04/30/2023, 9:17 AM

## 2023-05-01 ENCOUNTER — Ambulatory Visit: Payer: Medicaid Other | Admitting: Occupational Therapy

## 2023-05-01 ENCOUNTER — Encounter: Payer: Self-pay | Admitting: Occupational Therapy

## 2023-05-01 DIAGNOSIS — R251 Tremor, unspecified: Secondary | ICD-10-CM

## 2023-05-01 DIAGNOSIS — M6281 Muscle weakness (generalized): Secondary | ICD-10-CM

## 2023-05-01 DIAGNOSIS — R2689 Other abnormalities of gait and mobility: Secondary | ICD-10-CM | POA: Diagnosis not present

## 2023-05-01 DIAGNOSIS — R278 Other lack of coordination: Secondary | ICD-10-CM | POA: Diagnosis not present

## 2023-05-01 DIAGNOSIS — R296 Repeated falls: Secondary | ICD-10-CM | POA: Diagnosis not present

## 2023-05-01 DIAGNOSIS — R2681 Unsteadiness on feet: Secondary | ICD-10-CM | POA: Diagnosis not present

## 2023-05-01 NOTE — Patient Instructions (Signed)
  Compensation Strategies for Tremors  When eating, try the following  Try stabilizing elbows on the tables or against your body. Eat out of bowls, divided plates, or use a plate guard (available at a medical supply store) and eat with a spoon so that you have an edge to scoop up food. Try raising your plate so that there is less distance between the plate and mouth. Use utensil with built-up/larger grips as they are easier to hold. Try either weighted utensil or 1-2 lb cuff weight on wrist to eat  When writing, try the following: Stabilize forearm on the table. Take your time as rushing/being stressed can increase tremors. Try a felt-tipped pen, it does not glide as much.  Avoid gel pens ( they move to much ). Consider using pre-printed labels with your name and address (carry them with you when you go out) or you can get stamps with your address or signature on it. Use a small tape recorder to record messages/reminders for yourself. Use pens with bigger grips.  When brushing your teeth, putting on make-up, or styling hair, try the following: Use an electric toothbrush. Use items with built-up grips. Stabilize your elbows against your body or on the counter. Use long-handled brushes/combs. Use a hair dryer with a stand.  In general: Avoid stress, fatigue or rushing as this can increase tremors. Sit down for activities that require more control/coordination. Perform "flicks".      Safety considerations for loss of sensation:   Look at affected hand when using it!   Do NOT use affected arm for anything: sharp, hot, breakable, or too heavy  Always check temperature of water (for showering, washing dishes, etc) with UNaffected arm/extremity  Consider travel mugs w/ lids to transport hot liquids/coffee  Consider alternative options and/or adaptive equipment to make things safer (ex: hand chopper or cut resistant glove for chopping vegetables), long silicone oven mitts and oven  rack pull, rolling cart or backpack to transport items from one room to another  Avoid cold temperatures as well (wear glove in cold temperatures, get ice w/ unaffected extremity)  AVOID handling chemicals and machinery

## 2023-05-01 NOTE — Therapy (Signed)
OUTPATIENT OCCUPATIONAL THERAPY NEURO TREATMENT  Patient Name: Cynthia Welch") MRN: [id number] DOB:July 07, 1961, 62 y.o., female Today's Date: 05/01/2023  PCP: Morene Crocker, MD REFERRING PROVIDER: Dickie La, MD  END OF SESSION:  OT End of Session - 05/01/23 0849     Visit Number 3    Number of Visits 9    Date for OT Re-Evaluation 06/16/23    Authorization Type [employer] - Berkley Harvey required/submitted    OT Start Time 0848    OT Stop Time 0930    OT Time Calculation (min) 42 min    Equipment Utilized During Treatment FM materials    Activity Tolerance Patient tolerated treatment well    Behavior During Therapy WFL for tasks assessed/performed             Past Medical History:  Diagnosis Date   Anxiety    Childhood asthma    Depression    Fibroids    GERD (gastroesophageal reflux disease)    Headache    "monthly" (05/15/2017)   High cholesterol    Hypertension    Pneumonia    "twice when I was young" (05/15/2017)   PONV (postoperative nausea and vomiting)    in the past "used to get nauseous"   S/P TAH (total abdominal hysterectomy) 09/07/2015   Seizures (HCC) last sz 04/2017   "I've had grand mal; usually hit the floor and wake up w/in a minute or so;last one was today, 05/15/2017"   Subdural hematoma (HCC) 01/29/2013   Per MRI 01/2013; "had a seizure and busted my head open"   Past Surgical History:  Procedure Laterality Date   ABDOMINAL HYSTERECTOMY N/A 09/07/2015   Procedure: HYSTERECTOMY ABDOMINAL, CYSTOTOMY WITH BLADDER REPAIR;  Surgeon: Catalina Antigua, MD;  Location: [facility];  Service: Gynecology;  Laterality: N/A;  Requested 09/07/15 @ 2:15p   BILATERAL SALPINGECTOMY Bilateral 09/07/2015   Procedure: BILATERAL SALPINGECTOMY;  Surgeon: Catalina Antigua, MD;  Location: [facility];  Service: Gynecology;  Laterality: Bilateral;   CHOLECYSTECTOMY N/A 06/08/2017   Procedure: LAPAROSCOPIC CHOLECYSTECTOMY WITH INTRAOPERATIVE CHOLANGIOGRAM;  Surgeon: Manus Rudd, MD;  Location: [facility];  Service: General;  Laterality: N/A;   DIAGNOSTIC LAPAROSCOPY     DILATION AND CURETTAGE OF UTERUS     ESOPHAGOGASTRODUODENOSCOPY N/A 10/05/2016   Procedure: ESOPHAGOGASTRODUODENOSCOPY (EGD);  Surgeon: Charlott Rakes, MD;  Location: [facility] ENDOSCOPY;  Service: Endoscopy;  Laterality: N/A;   FRACTURE SURGERY     ORIF ULNAR FRACTURE  09/03/2011   Procedure: OPEN REDUCTION INTERNAL FIXATION (ORIF) ULNAR FRACTURE;  Surgeon: Cammy Copa;  Location: [facility];  Service: Orthopedics;  Laterality: Right;   ORIF ULNAR FRACTURE  12/14/2011   Procedure: OPEN REDUCTION INTERNAL FIXATION (ORIF) ULNAR FRACTURE;  Surgeon: Cammy Copa, MD;  Location: [facility] OR;  Service: Orthopedics;  Laterality: Right;  Right ulnar shaft open reduction, internal fixation with right iliac crest bone graft   TONSILLECTOMY     Patient Active Problem List   Diagnosis Date Noted   Foot pain, right 04/03/2023   Tremor 04/03/2023   Neuropathic pain of forearm, right 11/14/2022   Spasms of the hands or feet 05/31/2022   Back pain 02/14/2022   Healthcare maintenance 05/01/2019   Macrocytosis without anemia 06/05/2017   Esophageal reflux 10/05/2016   Hypokalemia 01/29/2013   Seizure disorder (HCC) 08/10/2010   Tobacco use disorder 11/19/2008   HYPERCHOLESTEROLEMIA 10/31/2007   Hypertension 10/30/2007    ONSET DATE: 04/03/2023  REFERRING DIAG: R25.1 (ICD-10-CM) - Tremor  THERAPY DIAG:  Other lack  of coordination  Muscle weakness (generalized)  Tremor  Rationale for Evaluation and Treatment: Rehabilitation  SUBJECTIVE:   SUBJECTIVE STATEMENT:     No falls, but I had an episode on Friday with the heat, nausea, and then dizziness so I was in bed all weekend. It takes a few days to recover whenever this happens. I get them maybe 2-3 times per month.   Pt with referral to neurology and is scheduled with Dr. Terrace Arabia at [facility] on 07/06/23. She is on a wait-list to get in sooner if able.  Pt  accompanied by: self  PERTINENT HISTORY: Anxiety, asthma, depression, GERD, hypertension, seizures, subdural hematoma (2014)   PER MD NOTE:  Tremor Patient recently seen in clinic on 6/4 for bilateral hand and lower extremity spasms noted to have a generalized tremor both at rest and with movement.    During examination, tremor was noted in head, voice. EOM intact without nystagmus/ Hands and torso tremor at rest and postural. No pill rolling, bradikinesia, or rigidity.    PAIN:  Are you having pain? NA upon arrival d/t not doing anything yet and using Volatren gel in the morning - Spasms increase her pain to 10/10 Pain location: hands, arms, legs Pain description: spasms ie) like a pulling muscle especially along the ulnar side of her phalange Aggravating factors: pumping containers Relieving factors: Voltaren gel, heat blanket (at nighttime)   PRECAUTIONS: Fall and Other: seizure   WEIGHT BEARING RESTRICTIONS: No   FALLS: Has patient fallen in last 6 months? Yes. Number of falls 2-3; frequent falls into walls; recent injury to R ankle with fall   LIVING ENVIRONMENT: Lives with: lives with their partner (boyfriend, Therapist, sports) Lives in: House/apartment Stairs: Yes: External: 5 steps; none (pt does avoid stairs due to decreased safety) Has following equipment at home: Single point cane and Walker - 2 wheeled   PLOF: Independent with BADLS, gait and Independent with transfers.  Was working full time but unable now   PATIENT GOALS: "to feel better, to be able to move around"   OBJECTIVE:    DIAGNOSTIC FINDINGS:  R ankle xray 04/03/23 IMPRESSION: 1. No acute fracture or dislocation of the right ankle. 2. Possible small ankle joint effusion.   R foot xray 04/03/23 IMPRESSION: No acute bony abnormalities.  HAND DOMINANCE: Right  ADLs: Overall ADLs: Transfers/ambulation related to ADLs: Eating: normally ok unless tremors bad Grooming: normally ok unless tremors bad UB  Dressing: mod I  LB Dressing: mod I  Toileting: independent Bathing: mod I  Tub Shower transfers: normally ok Equipment: none  IADLs: Shopping: Boyfriend does mostly Light housekeeping: Mod I when not having the episodes of tremors Meal Prep: fluctuating assistance Community mobility: relies on boyfriend d/t tremors  Medication management: independent Handwriting:  75-90% w/ mild shaking  MOBILITY STATUS: Independent and uses cane or walker only occasionally   FUNCTIONAL OUTCOME MEASURES: Upper Extremity Functional Scale (UEFS): 37.5%  UPPER EXTREMITY ROM:  BUE AROM WNL's w/ mild shaking   UPPER EXTREMITY MMT:   BUE MMT grossly 3+/5 at shoulders   HAND FUNCTION: Grip strength: Right: 24.9 lbs; Left: 18.2 lbs (with great effort)   COORDINATION: 9 Hole Peg test: Right: 33.54 sec; Left: 37.92 sec (with shaking, ataxia)  SENSATION: Fingertips feel dulled bilateral hands  EDEMA: noted mild MP joint swelling Rt hand d/t arthritis?    COGNITION: Overall cognitive status:  Pt reports she forgets things  VISION: Subjective report: no changes, I've worn glasses since I was born Baseline  vision: Wears glasses all the time and Legally blind Visual history:  pt reports she's legally blind  PERCEPTION: Not tested  PRAXIS: Not tested  OBSERVATIONS: Pt reports dropping things a lot from Rt hand more than Lt hand   TODAY'S TREATMENT:                                                                                                                               Reviewed coordination HEP - pt demo each w/ min cues/modifications prn (larger coins for Lt hand, etc)  Discussed strategies for tremors and safety considerations for dropping items and decreased sensation - see pt instructions for details Practiced writing with Pen Again however pt prefers larger pen that she already has  PATIENT EDUCATION: Education details: strategies for tremors and safety considerations, review of  coordination HEP  Person educated: Patient Education method: Explanation, Demonstration, and Handouts Education comprehension: verbalized understanding and verbal cues required  HOME EXERCISE PROGRAM: 04/24/23 - coordination exercises with images 05/01/23: strategies for tremors and safety strategies   GOALS: Goals reviewed with patient? Yes  SHORT TERM GOALS: Target date: 05/16/23  Independent with coordination and putty HEP  Baseline: Not yet issued/addressed Goal status: IN PROGRESS 04/24/23 - Coordination Activities with images provided  2.  Pt to verbalize understanding with safety strategies and A/E to promote safety and prevent drops/spills Baseline: Not yet issued/addressed Goal status: IN PROGRESS 04/24/23 - discussed weighted utensils  3.  Independent with BUE strengthening HEP  Baseline: Not yet issued/addressed Goal status: IN PROGRESS   LONG TERM GOALS: Target date: 06/16/23  Pt to improve grip strength bilaterally by 5 lbs or more Baseline: Rt = 24, Lt = 18 Goal status: INITIAL  2.  Pt to improve coordination as evidenced by reducing speed on 9 hole peg test 5 sec. Baseline: Rt = 33 sec, Lt = 37 sec Goal status: IN PROGRESS  3.  Pt to improve overall UE use as evidenced by increase on UEFI scale to 45% or higher Baseline: 37.5% Goal status: INITIAL    ASSESSMENT:  CLINICAL IMPRESSION:  Patient reports she does do laundry or cooks much and can't do anything when she gets an "episode" for a few days. Pt doing well with coordination HEP   PERFORMANCE DEFICITS: in functional skills including ADLs, IADLs, coordination, dexterity, sensation, strength, pain, muscle spasms, Fine motor control, Gross motor control, mobility, body mechanics, decreased knowledge of use of DME, and UE functional use.   IMPAIRMENTS: are limiting patient from ADLs, IADLs, rest and sleep, work, and leisure.   CO-MORBIDITIES: may have co-morbidities  that affects occupational performance.  Patient will benefit from skilled OT to address above impairments and improve overall function.  REHAB POTENTIAL: Good   PLAN:  OT FREQUENCY: 1x/week  OT DURATION: 8 weeks (plus eval)   PLANNED INTERVENTIONS: self care/ADL training, therapeutic exercise, therapeutic activity, neuromuscular re-education, manual therapy, passive range of motion, functional mobility training, splinting, paraffin,  fluidotherapy, moist heat, cryotherapy, contrast bath, patient/family education, energy conservation, coping strategies training, and DME and/or AE instructions  RECOMMENDED OTHER SERVICES: none at this time  CONSULTED AND AGREED WITH PLAN OF CARE: Patient  PLAN FOR NEXT SESSION:   Add putty HEP   Sheran Lawless, OT 05/01/2023, 8:50 AM

## 2023-05-04 ENCOUNTER — Telehealth: Payer: Self-pay | Admitting: *Deleted

## 2023-05-04 NOTE — Telephone Encounter (Signed)
Appointment mail to patient for CT at [facility]  05/16/2023  @ 1:30.

## 2023-05-07 ENCOUNTER — Ambulatory Visit: Payer: Medicaid Other | Admitting: Physical Therapy

## 2023-05-07 ENCOUNTER — Ambulatory Visit: Payer: Medicaid Other | Admitting: Occupational Therapy

## 2023-05-07 ENCOUNTER — Encounter: Payer: Self-pay | Admitting: Occupational Therapy

## 2023-05-07 VITALS — BP 119/77 | HR 82

## 2023-05-07 DIAGNOSIS — R2689 Other abnormalities of gait and mobility: Secondary | ICD-10-CM

## 2023-05-07 DIAGNOSIS — M6281 Muscle weakness (generalized): Secondary | ICD-10-CM

## 2023-05-07 DIAGNOSIS — R296 Repeated falls: Secondary | ICD-10-CM | POA: Diagnosis not present

## 2023-05-07 DIAGNOSIS — R278 Other lack of coordination: Secondary | ICD-10-CM | POA: Diagnosis not present

## 2023-05-07 DIAGNOSIS — R251 Tremor, unspecified: Secondary | ICD-10-CM

## 2023-05-07 DIAGNOSIS — R2681 Unsteadiness on feet: Secondary | ICD-10-CM | POA: Diagnosis not present

## 2023-05-07 NOTE — Therapy (Signed)
OUTPATIENT OCCUPATIONAL THERAPY NEURO TREATMENT  Patient Name: Cynthia Welch [name]") MRN: [id number] DOB:[birth year], 62 y.o., female Today's Date: 05/07/2023  PCP: Morene Crocker, MD REFERRING PROVIDER: Dickie La, MD  END OF SESSION:  OT End of Session - 05/07/23 1017     Visit Number 4    Number of Visits 9    Date for OT Re-Evaluation 06/16/23    Authorization Type [employer] - auth required/submitted    OT Start Time 1015    OT Stop Time 1100    OT Time Calculation (min) 45 min    Equipment Utilized During Treatment FM materials    Activity Tolerance Patient tolerated treatment well    Behavior During Therapy WFL for tasks assessed/performed             Past Medical History:  Diagnosis Date   Anxiety    Childhood asthma    Depression    Fibroids    GERD (gastroesophageal reflux disease)    Headache    "monthly" (05/15/2017)   High cholesterol    Hypertension    Pneumonia    "twice when I was young" (05/15/2017)   PONV (postoperative nausea and vomiting)    in the past "used to get nauseous"   S/P TAH (total abdominal hysterectomy) 09/07/2015   Seizures (HCC) last sz 04/2017   "I've had grand mal; usually hit the floor and wake up w/in a minute or so;last one was today, 05/15/2017"   Subdural hematoma (HCC) 01/29/2013   Per MRI 01/2013; "had a seizure and busted my head open"   Past Surgical History:  Procedure Laterality Date   ABDOMINAL HYSTERECTOMY N/A 09/07/2015   Procedure: HYSTERECTOMY ABDOMINAL, CYSTOTOMY WITH BLADDER REPAIR;  Surgeon: Catalina Antigua, MD;  Location: [facility];  Service: Gynecology;  Laterality: N/A;  Requested 09/07/15 @ 2:15p   BILATERAL SALPINGECTOMY Bilateral 09/07/2015   Procedure: BILATERAL SALPINGECTOMY;  Surgeon: Catalina Antigua, MD;  Location: [facility];  Service: Gynecology;  Laterality: Bilateral;   CHOLECYSTECTOMY N/A 06/08/2017   Procedure: LAPAROSCOPIC CHOLECYSTECTOMY WITH INTRAOPERATIVE CHOLANGIOGRAM;  Surgeon: Manus Rudd, MD;  Location: [facility];  Service: General;  Laterality: N/A;   DIAGNOSTIC LAPAROSCOPY     DILATION AND CURETTAGE OF UTERUS     ESOPHAGOGASTRODUODENOSCOPY N/A 10/05/2016   Procedure: ESOPHAGOGASTRODUODENOSCOPY (EGD);  Surgeon: Charlott Rakes, MD;  Location: [facility] ENDOSCOPY;  Service: Endoscopy;  Laterality: N/A;   FRACTURE SURGERY     ORIF ULNAR FRACTURE  09/03/2011   Procedure: OPEN REDUCTION INTERNAL FIXATION (ORIF) ULNAR FRACTURE;  Surgeon: Cammy Copa;  Location: [facility];  Service: Orthopedics;  Laterality: Right;   ORIF ULNAR FRACTURE  12/14/2011   Procedure: OPEN REDUCTION INTERNAL FIXATION (ORIF) ULNAR FRACTURE;  Surgeon: Cammy Copa, MD;  Location: [facility] OR;  Service: Orthopedics;  Laterality: Right;  Right ulnar shaft open reduction, internal fixation with right iliac crest bone graft   TONSILLECTOMY     Patient Active Problem List   Diagnosis Date Noted   Foot pain, right 04/03/2023   Tremor 04/03/2023   Neuropathic pain of forearm, right 11/14/2022   Spasms of the hands or feet 05/31/2022   Back pain 02/14/2022   Healthcare maintenance 05/01/2019   Macrocytosis without anemia 06/05/2017   Esophageal reflux 10/05/2016   Hypokalemia 01/29/2013   Seizure disorder (HCC) 08/10/2010   Tobacco use disorder 11/19/2008   HYPERCHOLESTEROLEMIA 10/31/2007   Hypertension 10/30/2007    ONSET DATE: 04/03/2023  REFERRING DIAG: R25.1 (ICD-10-CM) - Tremor  THERAPY DIAG:  Other lack  of coordination  Muscle weakness (generalized)  Unsteadiness on feet  Rationale for Evaluation and Treatment: Rehabilitation  SUBJECTIVE:   SUBJECTIVE STATEMENT:     I haven't had another episode so I'm feeling better today. No falls but I do bump into things  Pt with referral to neurology and is scheduled with Dr. Terrace Arabia at [facility] on 07/06/23. She is on a wait-list to get in sooner if able.  Pt accompanied by: self  PERTINENT HISTORY: Anxiety, asthma, depression, GERD, hypertension,  seizures, subdural hematoma (2014)   PER MD NOTE:  Tremor Patient recently seen in clinic on 6/4 for bilateral hand and lower extremity spasms noted to have a generalized tremor both at rest and with movement.    During examination, tremor was noted in head, voice. EOM intact without nystagmus/ Hands and torso tremor at rest and postural. No pill rolling, bradikinesia, or rigidity.    PAIN:  Are you having pain? NA upon arrival d/t not doing anything yet and using Volatren gel in the morning - Spasms increase her pain to 10/10 Pain location: hands, arms, legs Pain description: spasms ie) like a pulling muscle especially along the ulnar side of her phalange Aggravating factors: pumping containers Relieving factors: Voltaren gel, heat blanket (at nighttime)   PRECAUTIONS: Fall and Other: seizure   WEIGHT BEARING RESTRICTIONS: No   FALLS: Has patient fallen in last 6 months? Yes. Number of falls 2-3; frequent falls into walls; recent injury to R ankle with fall   LIVING ENVIRONMENT: Lives with: lives with their partner (boyfriend, Therapist, sports) Lives in: House/apartment Stairs: Yes: External: 5 steps; none (pt does avoid stairs due to decreased safety) Has following equipment at home: Single point cane and Walker - 2 wheeled   PLOF: Independent with BADLS, gait and Independent with transfers.  Was working full time but unable now   PATIENT GOALS: "to feel better, to be able to move around"   OBJECTIVE:    DIAGNOSTIC FINDINGS:  R ankle xray 04/03/23 IMPRESSION: 1. No acute fracture or dislocation of the right ankle. 2. Possible small ankle joint effusion.   R foot xray 04/03/23 IMPRESSION: No acute bony abnormalities.  HAND DOMINANCE: Right  ADLs: Overall ADLs: Transfers/ambulation related to ADLs: Eating: normally ok unless tremors bad Grooming: normally ok unless tremors bad UB Dressing: mod I  LB Dressing: mod I  Toileting: independent Bathing: mod I  Tub Shower  transfers: normally ok Equipment: none  IADLs: Shopping: Boyfriend does mostly Light housekeeping: Mod I when not having the episodes of tremors Meal Prep: fluctuating assistance Community mobility: relies on boyfriend d/t tremors  Medication management: independent Handwriting:  75-90% w/ mild shaking  MOBILITY STATUS: Independent and uses cane or walker only occasionally   FUNCTIONAL OUTCOME MEASURES: Upper Extremity Functional Scale (UEFS): 37.5%  UPPER EXTREMITY ROM:  BUE AROM WNL's w/ mild shaking   UPPER EXTREMITY MMT:   BUE MMT grossly 3+/5 at shoulders   HAND FUNCTION: Grip strength: Right: 24.9 lbs; Left: 18.2 lbs (with great effort)   COORDINATION: 9 Hole Peg test: Right: 33.54 sec; Left: 37.92 sec (with shaking, ataxia)  SENSATION: Fingertips feel dulled bilateral hands  EDEMA: noted mild MP joint swelling Rt hand d/t arthritis?    COGNITION: Overall cognitive status:  Pt reports she forgets things  VISION: Subjective report: no changes, I've worn glasses since I was born Baseline vision: Wears glasses all the time and Legally blind Visual history:  pt reports she's legally blind  PERCEPTION: Not tested  PRAXIS: Not tested  OBSERVATIONS: Pt reports dropping things a lot from Rt hand more than Lt hand   TODAY'S TREATMENT:         Pt issued putty HEP and theraband HEP - See pt instructions for details. Pt performed each as instructed w/ min cues for proper positioning  Pt practiced carrying plate of "food" and cup 1/2 full of water with both hands (switching hands) and cues to anchor arm against body to reduce shaking/jerky movements.  Practiced pouring water from one cup to another for control/coordination                                                                                                                       PATIENT EDUCATION: Education details: putty HEP, theraband HEP  Person educated: Patient Education method: Explanation,  Demonstration, and Handouts Education comprehension: verbalized understanding and verbal cues required  HOME EXERCISE PROGRAM: 04/24/23 - coordination exercises with images 05/01/23: strategies for tremors and safety strategies 05/07/23: putty HEP, theraband HEP    GOALS: Goals reviewed with patient? Yes  SHORT TERM GOALS: Target date: 05/16/23  Independent with coordination and putty HEP  Baseline: Not yet issued/addressed Goal status: MET  2.  Pt to verbalize understanding with safety strategies and A/E to promote safety and prevent drops/spills Baseline: Not yet issued/addressed Goal status: MET  3.  Independent with BUE strengthening HEP  Baseline: Not yet issued/addressed Goal status: IN PROGRESS   LONG TERM GOALS: Target date: 06/16/23  Pt to improve grip strength bilaterally by 5 lbs or more Baseline: Rt = 24, Lt = 18 Goal status: INITIAL  2.  Pt to improve coordination as evidenced by reducing speed on 9 hole peg test 5 sec. Baseline: Rt = 33 sec, Lt = 37 sec Goal status: IN PROGRESS  3.  Pt to improve overall UE use as evidenced by increase on UEFI scale to 45% or higher Baseline: 37.5% Goal status: INITIAL    ASSESSMENT:  CLINICAL IMPRESSION:  Patient reports she does do laundry or cooks much and can't do anything when she gets an "episode" for a few days. Pt doing well with coordination HEP and tolerating strengthening HEP well today. Pt has met 2/3 STG's  PERFORMANCE DEFICITS: in functional skills including ADLs, IADLs, coordination, dexterity, sensation, strength, pain, muscle spasms, Fine motor control, Gross motor control, mobility, body mechanics, decreased knowledge of use of DME, and UE functional use.   IMPAIRMENTS: are limiting patient from ADLs, IADLs, rest and sleep, work, and leisure.   CO-MORBIDITIES: may have co-morbidities  that affects occupational performance. Patient will benefit from skilled OT to address above impairments and improve overall  function.  REHAB POTENTIAL: Good   PLAN:  OT FREQUENCY: 1x/week  OT DURATION: 8 weeks (plus eval)   PLANNED INTERVENTIONS: self care/ADL training, therapeutic exercise, therapeutic activity, neuromuscular re-education, manual therapy, passive range of motion, functional mobility training, splinting, paraffin, fluidotherapy, moist heat, cryotherapy, contrast bath, patient/family education, energy conservation, coping strategies training, and  DME and/or AE instructions  RECOMMENDED OTHER SERVICES: none at this time  CONSULTED AND AGREED WITH PLAN OF CARE: Patient  PLAN FOR NEXT SESSION: review theraband HEP, UBE, coordination   Sheran Lawless, OT 05/07/2023, 10:17 AM

## 2023-05-07 NOTE — Patient Instructions (Addendum)
  1. Grip Strengthening (Resistive Putty)   Squeeze putty using entire Rt hand. Repeat _15-20___ times. Do __2__ sessions per day.  REPEAT with Lt hand   2. Roll putty into tube on table and pinch between first two fingers and thumb x 10 reps. Repeat with other hand. Do 2 sessions per day      Strengthening: Resisted Flexion   Hold tubing with _____ arm(s) at side. Pull forward and up. Move shoulder through pain-free range of motion. Repeat __10__ times per set.  Do _1-2_ sessions per day , every other day   Strengthening: Resisted Extension   Hold tubing in _____ hand(s), arm forward. Pull arm back, elbow straight. Repeat _10___ times per set. Do _1-2___ sessions per day, every other day.   Resisted Horizontal Abduction: Bilateral   Sit or stand, tubing in both hands, arms out in front. Keeping arms straight, pinch shoulder blades together and stretch arms out. Repeat _10___ times per set. Do _1-2___ sessions per day, every other day.   Elbow Flexion: Resisted   With tubing held in ______ hand(s) and other end secured under foot, curl arm up as far as possible. Repeat _10___ times per set. Do _1-2___ sessions per day, every other day.    Elbow Extension: Resisted   Sit in chair with resistive band secured at armrest (or hold with other hand) and _______ elbow bent. Straighten elbow. Repeat _10___ times per set.  Do _1-2___ sessions per day, every other day.   Copyright  [employer]. All rights reserved.

## 2023-05-07 NOTE — Therapy (Signed)
OUTPATIENT PHYSICAL THERAPY NEURO TREATMENT   Patient Name: Cynthia Welch MRN: [id number] DOB:03-23-1961, 62 y.o., female Today's Date: 05/07/2023   PCP: Morene Crocker, MD REFERRING PROVIDER: Dickie La, MD  END OF SESSION:  PT End of Session - 05/07/23 1106     Visit Number 3    Number of Visits 7   with eval   Date for PT Re-Evaluation 06/18/23   to allow for scheduling delays   Authorization Type Medicaid [facility]    Authorization Time Period Approved 8 PT visits 04/23/2023-06/22/2023    Authorization - Number of Visits 8   approved 8 visits 04/23/23-06/22/23   PT Start Time 1105   from OT session   PT Stop Time 1145    PT Time Calculation (min) 40 min    Equipment Utilized During Treatment Gait belt    Activity Tolerance Patient tolerated treatment well    Behavior During Therapy WFL for tasks assessed/performed               Past Medical History:  Diagnosis Date   Anxiety    Childhood asthma    Depression    Fibroids    GERD (gastroesophageal reflux disease)    Headache    "monthly" (05/15/2017)   High cholesterol    Hypertension    Pneumonia    "twice when I was young" (05/15/2017)   PONV (postoperative nausea and vomiting)    in the past "used to get nauseous"   S/P TAH (total abdominal hysterectomy) 09/07/2015   Seizures (HCC) last sz 04/2017   "I've had grand mal; usually hit the floor and wake up w/in a minute or so;last one was today, 05/15/2017"   Subdural hematoma (HCC) 01/29/2013   Per MRI 01/2013; "had a seizure and busted my head open"   Past Surgical History:  Procedure Laterality Date   ABDOMINAL HYSTERECTOMY N/A 09/07/2015   Procedure: HYSTERECTOMY ABDOMINAL, CYSTOTOMY WITH BLADDER REPAIR;  Surgeon: Catalina Antigua, MD;  Location: [facility];  Service: Gynecology;  Laterality: N/A;  Requested 09/07/15 @ 2:15p   BILATERAL SALPINGECTOMY Bilateral 09/07/2015   Procedure: BILATERAL SALPINGECTOMY;  Surgeon: Catalina Antigua, MD;  Location: [facility];   Service: Gynecology;  Laterality: Bilateral;   CHOLECYSTECTOMY N/A 06/08/2017   Procedure: LAPAROSCOPIC CHOLECYSTECTOMY WITH INTRAOPERATIVE CHOLANGIOGRAM;  Surgeon: Manus Rudd, MD;  Location: [facility] OR;  Service: General;  Laterality: N/A;   DIAGNOSTIC LAPAROSCOPY     DILATION AND CURETTAGE OF UTERUS     ESOPHAGOGASTRODUODENOSCOPY N/A 10/05/2016   Procedure: ESOPHAGOGASTRODUODENOSCOPY (EGD);  Surgeon: Charlott Rakes, MD;  Location[facility] END[facility]COPY;  Service: Endoscopy;  Laterality: N/A;   FRACTURE SURGERY     ORIF ULNAR FRACTURE  09/03/2011   Procedure: OPEN REDUCTION INTERNAL FIXATION (ORIF) ULNAR FRACTURE;  Surgeon: Cammy Copa;  Location: [facility] OR;  Service: Orthopedics;  Laterality: Right;   ORIF ULNAR FRACTURE  12/14/2011   Procedure: OPEN REDUCTION INTERNAL FIXATION (ORIF) ULNAR FRACTURE;  Surgeon: Cammy Copa, MD;  Location: [facility] OR;  Service: Orthopedics;  Laterality: Right;  Right ulnar shaft open reduction, internal fixation with right iliac crest bone graft   TONSILLECTOMY     Patient Active Problem List   Diagnosis Date Noted   Foot pain, right 04/03/2023   Tremor 04/03/2023   Neuropathic pain of forearm, right 11/14/2022   Spasms of the hands or feet 05/31/2022   Back pain 02/14/2022   Healthcare maintenance 05/01/2019   Macrocytosis without anemia 06/05/2017   Esophageal reflux 10/05/2016   Hypokalemia 01/29/2013  Seizure disorder (HCC) 08/10/2010   Tobacco use disorder 11/19/2008   HYPERCHOLESTEROLEMIA 10/31/2007   Hypertension 10/30/2007    ONSET DATE: 04/03/2023  REFERRING DIAG: R25.1 (ICD-10-CM) - Tremor  THERAPY DIAG:  Other lack of coordination  Tremor  Unsteadiness on feet  Muscle weakness (generalized)  Other abnormalities of gait and mobility  Rationale for Evaluation and Treatment: Rehabilitation  SUBJECTIVE:                                                                                                                                                                                              SUBJECTIVE STATEMENT:  "[name]"  Pt reports she is feeling 100% better today compared to her last session. Pt reports she has these episodes 2-3 times/month and she has to stay in bed or she will fall, takes her several days to recover.  Pt with referral to neurology and is scheduled with Dr. Terrace Arabia at [facility] on 07/06/23. She is on a wait-list to get in sooner if able.   Pt accompanied by: self and husband in lobby   PERTINENT HISTORY:  Anxiety, asthma, depression, GERD, hypertension, seizures, subdural hematoma (2014)  PAIN:  Are you having pain? Yes: NPRS scale: 8/10 Pain location: low back and ribs Pain description: Achy  PRECAUTIONS: Fall and Other: seizure  WEIGHT BEARING RESTRICTIONS: No  FALLS: Has patient fallen in last 6 months? Yes. Number of falls 2-3; frequent falls into walls; recent injury to R ankle with fall  LIVING ENVIRONMENT: Lives with: lives with their partner (boyfriend, Therapist, sports) Lives in: House/apartment Stairs: Yes: External: 5 steps; none (pt does avoid stairs due to decreased safety) Has following equipment at home: Single point cane and Walker - 2 wheeled  PLOF: Independent with gait and Independent with transfers  PATIENT GOALS: "to feel better, to be able to move around"  OBJECTIVE:   DIAGNOSTIC FINDINGS:  R ankle xray 04/03/23 IMPRESSION: 1. No acute fracture or dislocation of the right ankle. 2. Possible small ankle joint effusion.  R foot xray 04/03/23 IMPRESSION: No acute bony abnormalities.  COGNITION: Overall cognitive status: Within functional limits for tasks assessed   SENSATION: N/T in hands and feet  COORDINATION: UE: Rapid alternating movements: WFL Finger to nose: dysmetric, LUE worse than RUE LE: Slightly dysmetric heel to shin  POSTURE: rounded shoulders and forward head  LOWER EXTREMITY ROM:     Active  Right Eval Left Eval  Hip flexion    Hip extension    Hip  abduction    Hip adduction    Hip internal rotation    Hip external rotation    Knee  flexion    Knee extension Tight HS Tight HS  Ankle dorsiflexion    Ankle plantarflexion    Ankle inversion    Ankle eversion     (Blank rows = not tested)  LOWER EXTREMITY MMT:    MMT Right Eval Left Eval  Hip flexion 5 4  Hip extension    Hip abduction    Hip adduction    Hip internal rotation    Hip external rotation    Knee flexion 5 3  Knee extension 5 3  Ankle dorsiflexion 4 3  Ankle plantarflexion    Ankle inversion    Ankle eversion    (Blank rows = not tested)  VITALS  Vitals:   05/07/23 1109  BP: 119/77  Pulse: 82  SpO2: 100%     TODAY'S TREATMENT:     Ther Act  Assessed vitals (see above) and WFL during session.  STG Assessment   [facility] PT Assessment - 05/07/23 1111       Standardized Balance Assessment   Standardized Balance Assessment Five Times Sit to Stand    Five times sit to stand comments  20.6   hands on thighs     Functional Gait  Assessment   Gait assessed  Yes    Gait Level Surface Walks 20 ft in less than 7 sec but greater than 5.5 sec, uses assistive device, slower speed, mild gait deviations, or deviates 6-10 in outside of the 12 in walkway width.    Change in Gait Speed Makes only minor adjustments to walking speed, or accomplishes a change in speed with significant gait deviations, deviates 10-15 in outside the 12 in walkway width, or changes speed but loses balance but is able to recover and continue walking.    Gait with Horizontal Head Turns Performs head turns with moderate changes in gait velocity, slows down, deviates 10-15 in outside 12 in walkway width but recovers, can continue to walk.    Gait with Vertical Head Turns Performs task with moderate change in gait velocity, slows down, deviates 10-15 in outside 12 in walkway width but recovers, can continue to walk.    Gait and Pivot Turn Turns slowly, requires verbal cueing, or requires several  small steps to catch balance following turn and stop    Step Over Obstacle Is able to step over one shoe box (4.5 in total height) but must slow down and adjust steps to clear box safely. May require verbal cueing.    Gait with Narrow Base of Support Ambulates less than 4 steps heel to toe or cannot perform without assistance.    Gait with Eyes Closed Walks 20 ft, slow speed, abnormal gait pattern, evidence for imbalance, deviates 10-15 in outside 12 in walkway width. Requires more than 9 sec to ambulate 20 ft.    Ambulating Backwards Walks 20 ft, slow speed, abnormal gait pattern, evidence for imbalance, deviates 10-15 in outside 12 in walkway width.    Steps Two feet to a stair, must use rail.    Total Score 10    FGA comment: 10/30, high fall risk            NMR: To work on dynamic standing balance: Alt L/R dot taps with gait with wide BOS 6 x 10 ft with min A Added in resisted RTB, unable to reach targets Resisted monster walks with RTB 6 x 10 ft with min A Resisted sidesteps L/R with RTB 3 x 10 ft each direction with min A  Pt initially with difficulty with control of LLE, improves as tasks progress.   TherEx SciFit multi-peaks level 3 for 8 minutes using BUE/BLEs for neural priming for reciprocal movement, dynamic cardiovascular warmup and increased amplitude of stepping. Pt reports no fatigue following exercise but does feel lightheaded. SpO2 100% and HR 102, recovers to 83 bpm with short rest break.  Standing alt UE/LE lifts at countertop x 10 reps B due to difficulty performing bird dogs at home. Pt requires increased time to sequence exercise but is able to perform safely. Revised HEP based on patient feedback and performance.   GAIT: Gait pattern: decreased hip/knee flexion- Right, decreased hip/knee flexion- Left, decreased ankle dorsiflexion- Right, decreased ankle dorsiflexion- Left, scissoring, ataxic, and narrow BOS Distance walked: various clinic distances Assistive  device utilized: None Level of assistance: CGA Comments: very ataxic, frequent scissoring of limbs and path deviation to the L   PATIENT EDUCATION: Education details: Continue HEP/revised HEP Person educated: Patient Education method: Programmer, multimedia, Demonstration, and Handouts Education comprehension: verbalized understanding, verbal cues required, and needs further education  HOME EXERCISE PROGRAM: Access Code: [email or web address] URL: [email or web address] Date: 04/23/2023 Prepared by: Alethia Berthold Plaster  Exercises - Seated march over  - 1 x daily - 7 x weekly - 3 sets - 10 reps - Bird Dog on Counter  - 1 x daily - 7 x weekly - 3 sets - 10 reps  GOALS: Goals reviewed with patient? Yes  SHORT TERM GOALS: Target date: 04/30/2023  Pt will be independent with initial HEP for improved strength, balance, transfers and gait. Baseline: Goal status: MET  2.  Pt will improve 5 x STS to less than or equal to 18 seconds to demonstrate improved functional strength and transfer efficiency.  Baseline: 21.22 sec (6/17); 23.06s w/BUE support (7/8), 20.6 sec with hands on thighs (7/15) Goal status: IN PROGRESS  3.  Pt will improve FGA to 15/30 for decreased fall risk  Baseline: 12/30 (6/17), 10/30 (7/15) Goal status: NOT MET  4.  SARA to be assessed and STG set Baseline:  Goal status: DC - not assessed prior to STG assessment    LONG TERM GOALS: Target date: 05/28/2023   Pt will be independent with final HEP for improved strength, balance, transfers and gait. Baseline:  Goal status: INITIAL  2.  Pt will improve 5 x STS to less than or equal to 15 seconds to demonstrate improved functional strength and transfer efficiency.  Baseline: 21.22 sec (6/17), 20.6 sec with hands on thighs (7/15) Goal status: INITIAL  3.  Pt will improve FGA to 18/30 for decreased fall risk  Baseline: 12/30 (6/17), 10/30 (7/15) Goal status: INITIAL  4.  SARA to be assessed and LTG set Baseline: d/c goal due  to presentation of symptoms and inconsistencies regarding ataxia Goal status: GOAL D/C   ASSESSMENT:  CLINICAL IMPRESSION: Emphasis of skilled PT session on assessing STG and continuing to work on dynamic standing balance. Pt has met 1/4 STG due to being independent with her initial HEP. She did make progress towards improving her functional LE strength with decreased time needed to complete the 5xSTS, however she did not quite reach goal level. Additionally, she scored worse on the FGA with ongoing gait and balance impairments as noted above. Pt continues to exhibit ongoing ataxic gait, scissoring of LE, and path deviation to the L during ambulation as well as intermittent UE tremors. Pt with no current medical explanation for her presentation of symptoms. Pt continues to benefit from skilled therapy  services to work towards improving her overall balance and decreasing her fall risk. Continue POC.    OBJECTIVE IMPAIRMENTS: Abnormal gait, decreased balance, decreased coordination, decreased knowledge of condition, decreased mobility, difficulty walking, decreased ROM, decreased strength, impaired perceived functional ability, increased muscle spasms, impaired sensation, impaired UE functional use, and pain.   ACTIVITY LIMITATIONS: carrying, lifting, bending, standing, squatting, stairs, and transfers  PARTICIPATION LIMITATIONS: meal prep, cleaning, driving, community activity, and occupation  PERSONAL FACTORS: Time since onset of injury/illness/exacerbation and 1-2 comorbidities:    Anxiety, asthma, depression, GERD, hypertension, seizures, subdural hematoma (2014)are also affecting patient's functional outcome.   REHAB POTENTIAL: Fair time since onset; unexplained origin of symptoms  CLINICAL DECISION MAKING: Stable/uncomplicated  EVALUATION COMPLEXITY: Moderate  PLAN:  PT FREQUENCY: 1-2x/week  PT DURATION: 6 weeks  PLANNED INTERVENTIONS: Therapeutic exercises, Therapeutic activity,  Neuromuscular re-education, Balance training, Gait training, Patient/Family education, Self Care, Joint mobilization, Stair training, Vestibular training, Canalith repositioning, Visual/preceptual remediation/compensation, Orthotic/Fit training, DME instructions, Aquatic Therapy, Dry Needling, Electrical stimulation, Cryotherapy, Moist heat, Manual therapy, and Re-evaluation  PLAN FOR NEXT SESSION: add to HEP for LE strengthening and balance, work on improving control of ataxic movements (weighted vest, weights on limbs), quadruped as tolerated in clinic    Peter Congo, PT, DPT, CSRS  05/07/2023, 11:47 AM

## 2023-05-11 ENCOUNTER — Other Ambulatory Visit (HOSPITAL_COMMUNITY): Payer: Self-pay

## 2023-05-14 ENCOUNTER — Ambulatory Visit: Payer: Medicaid Other | Admitting: Physical Therapy

## 2023-05-14 ENCOUNTER — Other Ambulatory Visit (HOSPITAL_COMMUNITY): Payer: Self-pay

## 2023-05-14 VITALS — BP 142/86 | HR 83

## 2023-05-14 DIAGNOSIS — R2681 Unsteadiness on feet: Secondary | ICD-10-CM | POA: Diagnosis not present

## 2023-05-14 DIAGNOSIS — R2689 Other abnormalities of gait and mobility: Secondary | ICD-10-CM | POA: Diagnosis not present

## 2023-05-14 DIAGNOSIS — R251 Tremor, unspecified: Secondary | ICD-10-CM | POA: Diagnosis not present

## 2023-05-14 DIAGNOSIS — R278 Other lack of coordination: Secondary | ICD-10-CM | POA: Diagnosis not present

## 2023-05-14 DIAGNOSIS — R296 Repeated falls: Secondary | ICD-10-CM | POA: Diagnosis not present

## 2023-05-14 DIAGNOSIS — M6281 Muscle weakness (generalized): Secondary | ICD-10-CM

## 2023-05-14 NOTE — Therapy (Signed)
OUTPATIENT PHYSICAL THERAPY NEURO TREATMENT   Patient Name: Cynthia Welch MRN: [id number] DOB:11/27/1960, 62 y.o., female Today's Date: 05/14/2023   PCP: Morene Crocker, MD REFERRING PROVIDER: Dickie La, MD  END OF SESSION:  PT End of Session - 05/14/23 0865     Visit Number 4    Number of Visits 7   with eval   Date for PT Re-Evaluation 06/18/23   to allow for scheduling delays   Authorization Type Medicaid [facility]    Authorization Time Period Approved 8 PT visits 04/23/2023-06/22/2023    Authorization - Number of Visits 8   approved 8 visits 04/23/23-06/22/23   PT Start Time 0849    PT Stop Time 0929    PT Time Calculation (min) 40 min    Equipment Utilized During Treatment Gait belt    Activity Tolerance Patient tolerated treatment well    Behavior During Therapy WFL for tasks assessed/performed                Past Medical History:  Diagnosis Date   Anxiety    Childhood asthma    Depression    Fibroids    GERD (gastroesophageal reflux disease)    Headache    "monthly" (05/15/2017)   High cholesterol    Hypertension    Pneumonia    "twice when I was young" (05/15/2017)   PONV (postoperative nausea and vomiting)    in the past "used to get nauseous"   S/P TAH (total abdominal hysterectomy) 09/07/2015   Seizures (HCC) last sz 04/2017   "I've had grand mal; usually hit the floor and wake up w/in a minute or so;last one was today, 05/15/2017"   Subdural hematoma (HCC) 01/29/2013   Per MRI 01/2013; "had a seizure and busted my head open"   Past Surgical History:  Procedure Laterality Date   ABDOMINAL HYSTERECTOMY N/A 09/07/2015   Procedure: HYSTERECTOMY ABDOMINAL, CYSTOTOMY WITH BLADDER REPAIR;  Surgeon: Catalina Antigua, MD;  Location: [facility];  Service: Gynecology;  Laterality: N/A;  Requested 09/07/15 @ 2:15p   BILATERAL SALPINGECTOMY Bilateral 09/07/2015   Procedure: BILATERAL SALPINGECTOMY;  Surgeon: Catalina Antigua, MD;  Location: [facility];  Service:  Gynecology;  Laterality: Bilateral;   CHOLECYSTECTOMY N/A 06/08/2017   Procedure: LAPAROSCOPIC CHOLECYSTECTOMY WITH INTRAOPERATIVE CHOLANGIOGRAM;  Surgeon: Manus Rudd, MD;  Location: [facility];  Service: General;  Laterality: N/A;   DIAGNOSTIC LAPAROSCOPY     DILATION AND CURETTAGE OF UTERUS     ESOPHAGOGASTRODUODENOSCOPY N/A 10/05/2016   Procedure: ESOPHAGOGASTRODUODENOSCOPY (EGD);  Surgeon: Charlott Rakes, MD;  Location: [facility] ENDOSCOPY;  Service: Endoscopy;  Laterality: N/A;   FRACTURE SURGERY     ORIF ULNAR FRACTURE  09/03/2011   Procedure: OPEN REDUCTION INTERNAL FIXATION (ORIF) ULNAR FRACTURE;  Surgeon: Cammy Copa;  Location: [facility] OR;  Service: Orthopedics;  Laterality: Right;   ORIF ULNAR FRACTURE  12/14/2011   Procedure: OPEN REDUCTION INTERNAL FIXATION (ORIF) ULNAR FRACTURE;  Surgeon: Cammy Copa, MD;  Location: [facility] OR;  Service: Orthopedics;  Laterality: Right;  Right ulnar shaft open reduction, internal fixation with right iliac crest bone graft   TONSILLECTOMY     Patient Active Problem List   Diagnosis Date Noted   Foot pain, right 04/03/2023   Tremor 04/03/2023   Neuropathic pain of forearm, right 11/14/2022   Spasms of the hands or feet 05/31/2022   Back pain 02/14/2022   Healthcare maintenance 05/01/2019   Macrocytosis without anemia 06/05/2017   Esophageal reflux 10/05/2016   Hypokalemia 01/29/2013   Seizure disorder (  HCC) 08/10/2010   Tobacco use disorder 11/19/2008   HYPERCHOLESTEROLEMIA 10/31/2007   Hypertension 10/30/2007    ONSET DATE: 04/03/2023  REFERRING DIAG: R25.1 (ICD-10-CM) - Tremor  THERAPY DIAG:  Other lack of coordination  Muscle weakness (generalized)  Unsteadiness on feet  Other abnormalities of gait and mobility  Rationale for Evaluation and Treatment: Rehabilitation  SUBJECTIVE:                                                                                                                                                                                              SUBJECTIVE STATEMENT:  "Cynthia Welch"  Pt reports doing well. Is scheduled for an MRI this Wednesday, 7/24. Denies falls but a few near misses, fell into the wall when she tripped over something while picking up the litter box and has ran into the wall a few times. "Things just tend to shift, but I catch myself so its not an 'actual fall'". Denies pain today.   Pt with referral to neurology and is scheduled with Dr. Terrace Arabia at [facility] on 07/06/23. She is on a wait-list to get in sooner if able.   Pt accompanied by: self and husband in lobby   PERTINENT HISTORY:  Anxiety, asthma, depression, GERD, hypertension, seizures, subdural hematoma (2014)  PAIN:  Are you having pain? No  PRECAUTIONS: Fall and Other: seizure  WEIGHT BEARING RESTRICTIONS: No  FALLS: Has patient fallen in last 6 months? Yes. Number of falls 2-3; frequent falls into walls; recent injury to R ankle with fall  LIVING ENVIRONMENT: Lives with: lives with their partner (boyfriend, Therapist, sports) Lives in: House/apartment Stairs: Yes: External: 5 steps; none (pt does avoid stairs due to decreased safety) Has following equipment at home: Single point cane and Walker - 2 wheeled  PLOF: Independent with gait and Independent with transfers  PATIENT GOALS: "to feel better, to be able to move around"  OBJECTIVE:   DIAGNOSTIC FINDINGS:  R ankle xray 04/03/23 IMPRESSION: 1. No acute fracture or dislocation of the right ankle. 2. Possible small ankle joint effusion.  R foot xray 04/03/23 IMPRESSION: No acute bony abnormalities.  COGNITION: Overall cognitive status: Within functional limits for tasks assessed   SENSATION: N/T in hands and feet  COORDINATION: UE: Rapid alternating movements: WFL Finger to nose: dysmetric, LUE worse than RUE LE: Slightly dysmetric heel to shin  POSTURE: rounded shoulders and forward head  LOWER EXTREMITY ROM:     Active  Right Eval Left Eval  Hip flexion     Hip extension    Hip abduction    Hip adduction    Hip internal rotation  Hip external rotation    Knee flexion    Knee extension Tight HS Tight HS  Ankle dorsiflexion    Ankle plantarflexion    Ankle inversion    Ankle eversion     (Blank rows = not tested)  LOWER EXTREMITY MMT:    MMT Right Eval Left Eval  Hip flexion 5 4  Hip extension    Hip abduction    Hip adduction    Hip internal rotation    Hip external rotation    Knee flexion 5 3  Knee extension 5 3  Ankle dorsiflexion 4 3  Ankle plantarflexion    Ankle inversion    Ankle eversion    (Blank rows = not tested)  VITALS  Vitals:   05/14/23 0856  BP: (!) 142/86  Pulse: 83      TODAY'S TREATMENT:     Ther Act  Assessed vitals (see above) and WFL during session. Informed pt that she is scheduled for a lung CT on 7/24 and not a brain MRI. Pt reports she must have read it wrong.   NMR  In // bars, eccentric heel taps from 4" step in fwd and lateral directions, x10 per side, for improved LE coordination, single leg stability and quad strength. Pt required BUE support initially but did progress to RUE support only w/CGA. Noted dysmetria of LLE initially that improved w/additional reps. Added to HEP (see bolded below) w/emphasis on pt performing at bottom of exterior steps at home w/BUE support. Pt responded well to neural priming.   STAIRS:  Level of Assistance: SBA  Stair Negotiation Technique: Alternating Pattern  with Bilateral Rails  Number of Stairs: 16   Height of Stairs: 6"  Comments: Pt responds well to use of neural priming prior to task (eccentric heel taps) and noted diminished dysmetria and increased stability on stairs this date compared to previous sessions. Pt reports once she can "tell her legs" what to do, she can perform.   At mat table, spanish squats w/double folded orange resistance band, x15 reps, for improved quad strength. Pt initially required min A due to anterior LOB and narrow  BOS, but quickly was able to perform w/CGA. Noted increased tremor in RLE > LLE w/task, likely due to heavy reliance on RLE to perform due to LLE muscle atrophy.  Leg presses for improved LE strength, neural priming for linear pathway and endurance: Double leg presses at 45# for 15 reps. Mod cues to avoid hyperextension of knees at top of rep and for slow eccentric control. Noted genu valgus bilaterally throughout  Single leg presses for added single leg stability:  8 reps at 35# on RLE   5 reps at 25# on LLE. Min A to assist w/knee extension due to quad weakness   GAIT: Gait pattern: decreased hip/knee flexion- Right, decreased hip/knee flexion- Left, decreased ankle dorsiflexion- Right, decreased ankle dorsiflexion- Left, scissoring, ataxic, and narrow BOS Distance walked: various clinic distances Assistive device utilized: None Level of assistance: SBA and CGA Comments: Pt initially required CGA due to scissoring of BLEs and lateral gait deviations to L side. By end of session, pt ambulated w/no scissoring or gait deviations, requiring only SBA    PATIENT EDUCATION: Education details: Updates to HEP  Person educated: Patient Education method: Programmer, multimedia, Demonstration, and Handouts Education comprehension: verbalized understanding, returned demonstration, verbal cues required, and needs further education  HOME EXERCISE PROGRAM: Access Code: [email or web address] URL: [email or web address] Date: 04/23/2023 Prepared by: Alethia Berthold Maudene Stotler  Exercises - Seated march  over  - 1 x daily - 7 x weekly - 3 sets - 10 reps - Bird Dog on Counter  - 1 x daily - 7 x weekly - 3 sets - 10 reps - Forward Step Down with Heel Tap and Rail Support  - 1 x daily - 7 x weekly - 3 sets - 10 reps - Lateral Step Up with Counter Support  - 1 x daily - 7 x weekly - 3 sets - 10 reps  GOALS: Goals reviewed with patient? Yes  SHORT TERM GOALS: Target date: 04/30/2023  Pt will be independent with initial HEP  for improved strength, balance, transfers and gait. Baseline: Goal status: MET  2.  Pt will improve 5 x STS to less than or equal to 18 seconds to demonstrate improved functional strength and transfer efficiency.  Baseline: 21.22 sec (6/17); 23.06s w/BUE support (7/8), 20.6 sec with hands on thighs (7/15) Goal status: IN PROGRESS  3.  Pt will improve FGA to 15/30 for decreased fall risk  Baseline: 12/30 (6/17), 10/30 (7/15) Goal status: NOT MET  4.  SARA to be assessed and STG set Baseline:  Goal status: DC - not assessed prior to STG assessment    LONG TERM GOALS: Target date: 05/28/2023   Pt will be independent with final HEP for improved strength, balance, transfers and gait. Baseline:  Goal status: INITIAL  2.  Pt will improve 5 x STS to less than or equal to 15 seconds to demonstrate improved functional strength and transfer efficiency.  Baseline: 21.22 sec (6/17), 20.6 sec with hands on thighs (7/15) Goal status: INITIAL  3.  Pt will improve FGA to 18/30 for decreased fall risk  Baseline: 12/30 (6/17), 10/30 (7/15) Goal status: INITIAL  4.  SARA to be assessed and LTG set Baseline: d/c goal due to presentation of symptoms and inconsistencies regarding ataxia Goal status: GOAL D/C   ASSESSMENT:  CLINICAL IMPRESSION: Emphasis of skilled PT session on eccentric quad control, single leg stability, stair navigation and LE coordination. Pt tolerated session well w/no report of pain or dizziness throughout session. Pt responds well to use of neural priming for reduced dysmetria of LLE and overall improved stability, as noted by part-practice of eccentric heel taps followed by stair navigation. Pt reported feeling much more stable by end of session and noted absence of gait abnormalities when pt ambulating out of session compared to when ambulating into session. Added eccentric heel taps to HEP for pt to perform on exterior steps w/BUE support and husband's supervision. Pt  informed that she has CT of lungs on Wednesday, not MRI as she reported initially. Continue POC.     OBJECTIVE IMPAIRMENTS: Abnormal gait, decreased balance, decreased coordination, decreased knowledge of condition, decreased mobility, difficulty walking, decreased ROM, decreased strength, impaired perceived functional ability, increased muscle spasms, impaired sensation, impaired UE functional use, and pain.   ACTIVITY LIMITATIONS: carrying, lifting, bending, standing, squatting, stairs, and transfers  PARTICIPATION LIMITATIONS: meal prep, cleaning, driving, community activity, and occupation  PERSONAL FACTORS: Time since onset of injury/illness/exacerbation and 1-2 comorbidities:    Anxiety, asthma, depression, GERD, hypertension, seizures, subdural hematoma (2014)are also affecting patient's functional outcome.   REHAB POTENTIAL: Fair time since onset; unexplained origin of symptoms  CLINICAL DECISION MAKING: Stable/uncomplicated  EVALUATION COMPLEXITY: Moderate  PLAN:  PT FREQUENCY: 1-2x/week  PT DURATION: 6 weeks  PLANNED INTERVENTIONS: Therapeutic exercises, Therapeutic activity, Neuromuscular re-education, Balance training, Gait training, Patient/Family education, Self Care, Joint mobilization, Stair training, Vestibular training, Canalith  repositioning, Visual/preceptual remediation/compensation, Orthotic/Fit training, DME instructions, Aquatic Therapy, Dry Needling, Electrical stimulation, Cryotherapy, Moist heat, Manual therapy, and Re-evaluation  PLAN FOR NEXT SESSION: add to HEP for LE strengthening and balance, work on improving control of ataxic movements (weighted vest, weights on limbs), quadruped as tolerated in clinic, hip abduction/ER strength. Pt responds well to neural priming     Josephine Igo, PT, DPT [facility] [address] [place], [place]  [address] Phone:  [phone] Fax:  [phone]  05/14/2023, 9:32 AM

## 2023-05-15 ENCOUNTER — Other Ambulatory Visit (HOSPITAL_COMMUNITY): Payer: Self-pay

## 2023-05-16 ENCOUNTER — Ambulatory Visit (HOSPITAL_COMMUNITY)
Admission: RE | Admit: 2023-05-16 | Discharge: 2023-05-16 | Disposition: A | Discharge status: Home / Self Care | Payer: Medicaid Other | Source: Ambulatory Visit | Attending: Student in an Organized Health Care Education/Training Program | Admitting: Student in an Organized Health Care Education/Training Program

## 2023-05-16 DIAGNOSIS — F17210 Nicotine dependence, cigarettes, uncomplicated: Secondary | ICD-10-CM | POA: Diagnosis not present

## 2023-05-16 DIAGNOSIS — Z122 Encounter for screening for malignant neoplasm of respiratory organs: Secondary | ICD-10-CM | POA: Insufficient documentation

## 2023-05-21 ENCOUNTER — Ambulatory Visit: Payer: Medicaid Other | Admitting: Physical Therapy

## 2023-05-21 ENCOUNTER — Ambulatory Visit: Payer: Medicaid Other | Admitting: Occupational Therapy

## 2023-05-21 ENCOUNTER — Encounter: Payer: Self-pay | Admitting: Occupational Therapy

## 2023-05-21 VITALS — BP 114/76 | HR 85

## 2023-05-21 DIAGNOSIS — R278 Other lack of coordination: Secondary | ICD-10-CM

## 2023-05-21 DIAGNOSIS — R2681 Unsteadiness on feet: Secondary | ICD-10-CM | POA: Diagnosis not present

## 2023-05-21 DIAGNOSIS — M6281 Muscle weakness (generalized): Secondary | ICD-10-CM

## 2023-05-21 DIAGNOSIS — R251 Tremor, unspecified: Secondary | ICD-10-CM

## 2023-05-21 DIAGNOSIS — R2689 Other abnormalities of gait and mobility: Secondary | ICD-10-CM | POA: Diagnosis not present

## 2023-05-21 DIAGNOSIS — R296 Repeated falls: Secondary | ICD-10-CM | POA: Diagnosis not present

## 2023-05-21 NOTE — Therapy (Signed)
OUTPATIENT OCCUPATIONAL THERAPY NEURO TREATMENT  Patient Name: Cynthia Welch") MRN: [id number] DOB:1961/05/25, 62 y.o., female Today's Date: 05/21/2023  PCP: Morene Crocker, MD REFERRING PROVIDER: Dickie La, MD  END OF SESSION:  OT End of Session - 05/21/23 0806     Visit Number 5    Number of Visits 9    Date for OT Re-Evaluation 06/16/23    Authorization Type [employer] - auth required/submitted    OT Start Time 0804    OT Stop Time 0845    OT Time Calculation (min) 41 min    Equipment Utilized During Treatment FM materials    Activity Tolerance Patient tolerated treatment well    Behavior During Therapy WFL for tasks assessed/performed             Past Medical History:  Diagnosis Date   Anxiety    Childhood asthma    Depression    Fibroids    GERD (gastroesophageal reflux disease)    Headache    "monthly" (05/15/2017)   High cholesterol    Hypertension    Pneumonia    "twice when I was young" (05/15/2017)   PONV (postoperative nausea and vomiting)    in the past "used to get nauseous"   S/P TAH (total abdominal hysterectomy) 09/07/2015   Seizures (HCC) last sz 04/2017   "I've had grand mal; usually hit the floor and wake up w/in a minute or so;last one was today, 05/15/2017"   Subdural hematoma (HCC) 01/29/2013   Per MRI 01/2013; "had a seizure and busted my head open"   Past Surgical History:  Procedure Laterality Date   ABDOMINAL HYSTERECTOMY N/A 09/07/2015   Procedure: HYSTERECTOMY ABDOMINAL, CYSTOTOMY WITH BLADDER REPAIR;  Surgeon: Catalina Antigua, MD;  Location: [facility];  Service: Gynecology;  Laterality: N/A;  Requested 09/07/15 @ 2:15p   BILATERAL SALPINGECTOMY Bilateral 09/07/2015   Procedure: BILATERAL SALPINGECTOMY;  Surgeon: Catalina Antigua, MD;  Location: [facility];  Service: Gynecology;  Laterality: Bilateral;   CHOLECYSTECTOMY N/A 06/08/2017   Procedure: LAPAROSCOPIC CHOLECYSTECTOMY WITH INTRAOPERATIVE CHOLANGIOGRAM;  Surgeon: Manus Rudd, MD;  Location: [facility] OR;  Service: General;  Laterality: N/A;   DIAGNOSTIC LAPAROSCOPY     DILATION AND CURETTAGE OF UTERUS     ESOPHAGOGASTRODUODENOSCOPY N/A 10/05/2016   Procedure: ESOPHAGOGASTRODUODENOSCOPY (EGD);  Surgeon: Charlott Rakes, MD;  [facility] END[facility]COPY;  Service: Endoscopy;  Laterality: N/A;   FRACTURE SURGERY     ORIF ULNAR FRACTURE  09/03/2011   Procedure: OPEN REDUCTION INTERNAL FIXATION (ORIF) ULNAR FRACTURE;  Surgeon: Cammy Copa;  Location: [facility] OR;  Service: Orthopedics;  Laterality: Right;   ORIF ULNAR FRACTURE  12/14/2011   Procedure: OPEN REDUCTION INTERNAL FIXATION (ORIF) ULNAR FRACTURE;  Surgeon: Cammy Copa, MD;  Location: [facility] OR;  Service: Orthopedics;  Laterality: Right;  Right ulnar shaft open reduction, internal fixation with right iliac crest bone graft   TONSILLECTOMY     Patient Active Problem List   Diagnosis Date Noted   Foot pain, right 04/03/2023   Tremor 04/03/2023   Neuropathic pain of forearm, right 11/14/2022   Spasms of the hands or feet 05/31/2022   Back pain 02/14/2022   Healthcare maintenance 05/01/2019   Macrocytosis without anemia 06/05/2017   Esophageal reflux 10/05/2016   Hypokalemia 01/29/2013   Seizure disorder (HCC) 08/10/2010   Tobacco use disorder 11/19/2008   HYPERCHOLESTEROLEMIA 10/31/2007   Hypertension 10/30/2007    ONSET DATE: 04/03/2023  REFERRING DIAG: R25.1 (ICD-10-CM) - Tremor  THERAPY DIAG:  Other lack  of coordination  Muscle weakness (generalized)  Unsteadiness on feet  Tremor  Rationale for Evaluation and Treatment: Rehabilitation  SUBJECTIVE:   SUBJECTIVE STATEMENT:     I haven't had another episode. I tripped but no falls  Pt with referral to neurology and is scheduled with Dr. Terrace Arabia at [facility] on 07/06/23. She is on a wait-list to get in sooner if able.  Pt accompanied by: self  PERTINENT HISTORY: Anxiety, asthma, depression, GERD, hypertension, seizures, subdural hematoma (2014)    PER MD NOTE:  Tremor Patient recently seen in clinic on 6/4 for bilateral hand and lower extremity spasms noted to have a generalized tremor both at rest and with movement.    During examination, tremor was noted in head, voice. EOM intact without nystagmus/ Hands and torso tremor at rest and postural. No pill rolling, bradikinesia, or rigidity.    PAIN:  Are you having pain? NA upon arrival d/t not doing anything yet and using Volatren gel in the morning - Spasms increase her pain to 10/10 Pain location: hands, arms, legs Pain description: spasms ie) like a pulling muscle especially along the ulnar side of her phalange Aggravating factors: pumping containers Relieving factors: Voltaren gel, heat blanket (at nighttime)   PRECAUTIONS: Fall and Other: seizure   WEIGHT BEARING RESTRICTIONS: No   FALLS: Has patient fallen in last 6 months? Yes. Number of falls 2-3; frequent falls into walls; recent injury to R ankle with fall   LIVING ENVIRONMENT: Lives with: lives with their partner (boyfriend, Therapist, sports) Lives in: House/apartment Stairs: Yes: External: 5 steps; none (pt does avoid stairs due to decreased safety) Has following equipment at home: Single point cane and Walker - 2 wheeled   PLOF: Independent with BADLS, gait and Independent with transfers.  Was working full time but unable now   PATIENT GOALS: "to feel better, to be able to move around"   OBJECTIVE:    DIAGNOSTIC FINDINGS:  R ankle xray 04/03/23 IMPRESSION: 1. No acute fracture or dislocation of the right ankle. 2. Possible small ankle joint effusion.   R foot xray 04/03/23 IMPRESSION: No acute bony abnormalities.  HAND DOMINANCE: Right  ADLs: Overall ADLs: Transfers/ambulation related to ADLs: Eating: normally ok unless tremors bad Grooming: normally ok unless tremors bad UB Dressing: mod I  LB Dressing: mod I  Toileting: independent Bathing: mod I  Tub Shower transfers: normally ok Equipment:  none  IADLs: Shopping: Boyfriend does mostly Light housekeeping: Mod I when not having the episodes of tremors Meal Prep: fluctuating assistance Community mobility: relies on boyfriend d/t tremors  Medication management: independent Handwriting:  75-90% w/ mild shaking  MOBILITY STATUS: Independent and uses cane or walker only occasionally   FUNCTIONAL OUTCOME MEASURES: Upper Extremity Functional Scale (UEFS): 37.5%  UPPER EXTREMITY ROM:  BUE AROM WNL's w/ mild shaking   UPPER EXTREMITY MMT:   BUE MMT grossly 3+/5 at shoulders   HAND FUNCTION: Grip strength: Right: 24.9 lbs; Left: 18.2 lbs (with great effort)   COORDINATION: 9 Hole Peg test: Right: 33.54 sec; Left: 37.92 sec (with shaking, ataxia)  SENSATION: Fingertips feel dulled bilateral hands  EDEMA: noted mild MP joint swelling Rt hand d/t arthritis?    COGNITION: Overall cognitive status:  Pt reports she forgets things  VISION: Subjective report: no changes, I've worn glasses since I was born Baseline vision: Wears glasses all the time and Legally blind Visual history:  pt reports she's legally blind  PERCEPTION: Not tested  PRAXIS: Not tested  OBSERVATIONS: Pt  reports dropping things a lot from Rt hand more than Lt hand   TODAY'S TREATMENT:         Reviewed theraband HEP issued last session- See pt instructions for details. Pt with significantly weaker LUE. Pt performed each x 10 reps  Pt placing small pegs in pegboard Lt hand with mod difficulty and jerky/shaky movements, min to mod drops.    UBE x 5 mintues, level 1 for UE strength/endurance                                                                                                                    PATIENT EDUCATION: See above  HOME EXERCISE PROGRAM: 04/24/23 - coordination exercises with images 05/01/23: strategies for tremors and safety strategies 05/07/23: putty HEP, theraband HEP    GOALS: Goals reviewed with patient? Yes  SHORT  TERM GOALS: Target date: 05/16/23  Independent with coordination and putty HEP  Baseline: Not yet issued/addressed Goal status: MET  2.  Pt to verbalize understanding with safety strategies and A/E to promote safety and prevent drops/spills Baseline: Not yet issued/addressed Goal status: MET  3.  Independent with BUE strengthening HEP  Baseline: Not yet issued/addressed Goal status: MET   LONG TERM GOALS: Target date: 06/16/23  Pt to improve grip strength bilaterally by 5 lbs or more Baseline: Rt = 24, Lt = 18 Goal status: INITIAL  2.  Pt to improve coordination as evidenced by reducing speed on 9 hole peg test 5 sec. Baseline: Rt = 33 sec, Lt = 37 sec Goal status: IN PROGRESS  3.  Pt to improve overall UE use as evidenced by increase on UEFI scale to 45% or higher Baseline: 37.5% Goal status: INITIAL    ASSESSMENT:  CLINICAL IMPRESSION:  Patient has met all STG's. Pt with weaker LUE. Pt with sporadic jerky movements.   PERFORMANCE DEFICITS: in functional skills including ADLs, IADLs, coordination, dexterity, sensation, strength, pain, muscle spasms, Fine motor control, Gross motor control, mobility, body mechanics, decreased knowledge of use of DME, and UE functional use.   IMPAIRMENTS: are limiting patient from ADLs, IADLs, rest and sleep, work, and leisure.   CO-MORBIDITIES: may have co-morbidities  that affects occupational performance. Patient will benefit from skilled OT to address above impairments and improve overall function.  REHAB POTENTIAL: Good   PLAN:  OT FREQUENCY: 1x/week  OT DURATION: 8 weeks (plus eval)   PLANNED INTERVENTIONS: self care/ADL training, therapeutic exercise, therapeutic activity, neuromuscular re-education, manual therapy, passive range of motion, functional mobility training, splinting, paraffin, fluidotherapy, moist heat, cryotherapy, contrast bath, patient/family education, energy conservation, coping strategies training, and DME  and/or AE instructions  RECOMMENDED OTHER SERVICES: none at this time  CONSULTED AND AGREED WITH PLAN OF CARE: Patient  PLAN FOR NEXT SESSION: progress towards LTG's   Sheran Lawless, OT 05/21/2023, 8:07 AM

## 2023-05-21 NOTE — Therapy (Signed)
OUTPATIENT PHYSICAL THERAPY NEURO TREATMENT   Patient Name: Cynthia Welch MRN: [id number] DOB:July 12, 1961, 62 y.o., female Today's Date: 05/21/2023   PCP: Morene Crocker, MD REFERRING PROVIDER: Dickie La, MD  END OF SESSION:  PT End of Session - 05/21/23 0850     Visit Number 5    Number of Visits 7   with eval   Date for PT Re-Evaluation 06/18/23   to allow for scheduling delays   Authorization Type Medicaid [facility]    Authorization Time Period Approved 8 PT visits 04/23/2023-06/22/2023    Authorization - Number of Visits 8   approved 8 visits 04/23/23-06/22/23   PT Start Time 0849   handoff w/OT   PT Stop Time 0930    PT Time Calculation (min) 41 min    Equipment Utilized During Treatment Gait belt    Activity Tolerance Patient tolerated treatment well    Behavior During Therapy WFL for tasks assessed/performed                 Past Medical History:  Diagnosis Date   Anxiety    Childhood asthma    Depression    Fibroids    GERD (gastroesophageal reflux disease)    Headache    "monthly" (05/15/2017)   High cholesterol    Hypertension    Pneumonia    "twice when I was young" (05/15/2017)   PONV (postoperative nausea and vomiting)    in the past "used to get nauseous"   S/P TAH (total abdominal hysterectomy) 09/07/2015   Seizures (HCC) last sz 04/2017   "I've had grand mal; usually hit the floor and wake up w/in a minute or so;last one was today, 05/15/2017"   Subdural hematoma (HCC) 01/29/2013   Per MRI 01/2013; "had a seizure and busted my head open"   Past Surgical History:  Procedure Laterality Date   ABDOMINAL HYSTERECTOMY N/A 09/07/2015   Procedure: HYSTERECTOMY ABDOMINAL, CYSTOTOMY WITH BLADDER REPAIR;  Surgeon: Catalina Antigua, MD;  Location: [facility];  Service: Gynecology;  Laterality: N/A;  Requested 09/07/15 @ 2:15p   BILATERAL SALPINGECTOMY Bilateral 09/07/2015   Procedure: BILATERAL SALPINGECTOMY;  Surgeon: Catalina Antigua, MD;  Location: [facility];  Service: Gynecology;  Laterality: Bilateral;   CHOLECYSTECTOMY N/A 06/08/2017   Procedure: LAPAROSCOPIC CHOLECYSTECTOMY WITH INTRAOPERATIVE CHOLANGIOGRAM;  Surgeon: Manus Rudd, MD;  Location: [facility] OR;  Service: General;  Laterality: N/A;   DIAGNOSTIC LAPAROSCOPY     DILATION AND CURETTAGE OF UTERUS     ESOPHAGOGASTRODUODENOSCOPY N/A 10/05/2016   Procedure: ESOPHAGOGASTRODUODENOSCOPY (EGD);  Surgeon: Charlott Rakes, MD;  Location: [facility] ENDOSCOPY;  Service: Endoscopy;  Laterality: N/A;   FRACTURE SURGERY     ORIF ULNAR FRACTURE  09/03/2011   Procedure: OPEN REDUCTION INTERNAL FIXATION (ORIF) ULNAR FRACTURE;  Surgeon: Cammy Copa;  Location: [facility] OR;  Service: Orthopedics;  Laterality: Right;   ORIF ULNAR FRACTURE  12/14/2011   Procedure: OPEN REDUCTION INTERNAL FIXATION (ORIF) ULNAR FRACTURE;  Surgeon: Cammy Copa, MD;  Location: [facility] OR;  Service: Orthopedics;  Laterality: Right;  Right ulnar shaft open reduction, internal fixation with right iliac crest bone graft   TONSILLECTOMY     Patient Active Problem List   Diagnosis Date Noted   Foot pain, right 04/03/2023   Tremor 04/03/2023   Neuropathic pain of forearm, right 11/14/2022   Spasms of the hands or feet 05/31/2022   Back pain 02/14/2022   Healthcare maintenance 05/01/2019   Macrocytosis without anemia 06/05/2017   Esophageal reflux 10/05/2016   Hypokalemia 01/29/2013  Seizure disorder (HCC) 08/10/2010   Tobacco use disorder 11/19/2008   HYPERCHOLESTEROLEMIA 10/31/2007   Hypertension 10/30/2007    ONSET DATE: 04/03/2023  REFERRING DIAG: R25.1 (ICD-10-CM) - Tremor  THERAPY DIAG:  Other lack of coordination  Muscle weakness (generalized)  Unsteadiness on feet  Other abnormalities of gait and mobility  Repeated falls  Rationale for Evaluation and Treatment: Rehabilitation  SUBJECTIVE:                                                                                                                                                                                              SUBJECTIVE STATEMENT:  "Cynthia Welch"  Pt reports doing well. Has only had one "bump" into the wall since last visit. HEP is going fine. States she did go down the stairs to her laundry room for the first time in a while. Was escorted by her husband without difficulty.   Pt with referral to neurology and is scheduled with Dr. Terrace Arabia at [facility] on 07/06/23. She is on a wait-list to get in sooner if able.   Pt accompanied by: self and husband in lobby   PERTINENT HISTORY:  Anxiety, asthma, depression, GERD, hypertension, seizures, subdural hematoma (2014)  PAIN:  Are you having pain? No  PRECAUTIONS: Fall and Other: seizure  WEIGHT BEARING RESTRICTIONS: No  FALLS: Has patient fallen in last 6 months? Yes. Number of falls 2-3; frequent falls into walls; recent injury to R ankle with fall  LIVING ENVIRONMENT: Lives with: lives with their partner (boyfriend, Therapist, sports) Lives in: House/apartment Stairs: Yes: External: 5 steps; none (pt does avoid stairs due to decreased safety) Has following equipment at home: Single point cane and Walker - 2 wheeled  PLOF: Independent with gait and Independent with transfers  PATIENT GOALS: "to feel better, to be able to move around"  OBJECTIVE:   DIAGNOSTIC FINDINGS:  R ankle xray 04/03/23 IMPRESSION: 1. No acute fracture or dislocation of the right ankle. 2. Possible small ankle joint effusion.  R foot xray 04/03/23 IMPRESSION: No acute bony abnormalities.  COGNITION: Overall cognitive status: Within functional limits for tasks assessed   SENSATION: N/T in hands and feet  COORDINATION: UE: Rapid alternating movements: WFL Finger to nose: dysmetric, LUE worse than RUE LE: Slightly dysmetric heel to shin  POSTURE: rounded shoulders and forward head  LOWER EXTREMITY ROM:     Active  Right Eval Left Eval  Hip flexion    Hip extension    Hip abduction    Hip adduction    Hip  internal rotation    Hip external rotation    Knee flexion  Knee extension Tight HS Tight HS  Ankle dorsiflexion    Ankle plantarflexion    Ankle inversion    Ankle eversion     (Blank rows = not tested)  LOWER EXTREMITY MMT:    MMT Right Eval Left Eval  Hip flexion 5 4  Hip extension    Hip abduction    Hip adduction    Hip internal rotation    Hip external rotation    Knee flexion 5 3  Knee extension 5 3  Ankle dorsiflexion 4 3  Ankle plantarflexion    Ankle inversion    Ankle eversion    (Blank rows = not tested)  VITALS  Vitals:   05/21/23 0855  BP: 114/76  Pulse: 85     TODAY'S TREATMENT:     Ther Act  Assessed vitals (see above) and WFL during session.  NMR  In // bars for improved midline orientation, vestibular input, ankle/hip strategy and BLE strength:  On rockerboard in L/R direction Standing w/EO and no UE support. Pt frequently losing balance to L side and relied heavily on over exaggerated hip strategy to correct balance. Min A throughout. Cued pt to use mirror for visual biofeedback, but pt prefers tactile cues.  Mini squats x10 w/min A and no UE support. Pt frequently losing balance anterolaterally to L side and stating her "knee popped". Pt unable to further specify what this meant.  Standing w/EC and no UE support. Pt required heavy mod A due to LOB to L side and delayed righting reactions.  On rockerboard in A/P direction  Standing w/ EO and no UE support. Min A required due to posterior LOB  Alt cone taps w/BUE support progressing to no UE support, x12 reps per side w/min A-mod A due to dysmetria of LLE and posterolateral LOB to L side. Pt did improve w/practice but continues to display abnormal path deviations w/LLE.  On blue balance beam Side stepping x2 each direction without UE support and min A for steadying assist. Increased difficulty stepping to L > R  Added 2 4" hurdles for added single leg stability and step clearance, x2 each  direction. Min A throughout for steadying assist. Pt only hit hurdles when stepping to L side   Sit to stands w/single LE elevated on green dynadisc, x6 reps on RLE and x8 on LLE, for modified single leg squat, ankle strategy and BLE strength. Min cues for proper technique (scooting hips to edge of mat, proper BLE placement and anterior lean). Pt initially bracing RLE against back of mat to stabilize, so min cues to reduce bracing compensation. Pt more challenged when standing on green disc with LLE. Min A for occasional anterior LOB      GAIT: Gait pattern: decreased hip/knee flexion- Right, decreased hip/knee flexion- Left, decreased ankle dorsiflexion- Right, decreased ankle dorsiflexion- Left, scissoring, ataxic, and narrow BOS Distance walked: various clinic distances Assistive device utilized: None Level of assistance: SBA Comments: Pt more stable this date w/reduced scissoring of gait and no furniture walking    PATIENT EDUCATION: Education details: Continue HEP  Person educated: Patient Education method: Programmer, multimedia, Facilities manager, and Handouts Education comprehension: verbalized understanding, returned demonstration, verbal cues required, and needs further education  HOME EXERCISE PROGRAM: Access Code: [email or web address] URL: [email or web address] Date: 04/23/2023 Prepared by: Alethia Berthold Shawnika Pepin  Exercises - Seated march over  - 1 x daily - 7 x weekly - 3 sets - 10 reps - Bird Dog on Counter  - 1 x daily - 7 x  weekly - 3 sets - 10 reps - Forward Step Down with Heel Tap and Rail Support  - 1 x daily - 7 x weekly - 3 sets - 10 reps - Lateral Step Up with Counter Support  - 1 x daily - 7 x weekly - 3 sets - 10 reps  GOALS: Goals reviewed with patient? Yes  SHORT TERM GOALS: Target date: 04/30/2023  Pt will be independent with initial HEP for improved strength, balance, transfers and gait. Baseline: Goal status: MET  2.  Pt will improve 5 x STS to less than or equal to 18  seconds to demonstrate improved functional strength and transfer efficiency.  Baseline: 21.22 sec (6/17); 23.06s w/BUE support (7/8), 20.6 sec with hands on thighs (7/15) Goal status: IN PROGRESS  3.  Pt will improve FGA to 15/30 for decreased fall risk  Baseline: 12/30 (6/17), 10/30 (7/15) Goal status: NOT MET  4.  SARA to be assessed and STG set Baseline:  Goal status: DC - not assessed prior to STG assessment    LONG TERM GOALS: Target date: 05/28/2023   Pt will be independent with final HEP for improved strength, balance, transfers and gait. Baseline:  Goal status: INITIAL  2.  Pt will improve 5 x STS to less than or equal to 15 seconds to demonstrate improved functional strength and transfer efficiency.  Baseline: 21.22 sec (6/17), 20.6 sec with hands on thighs (7/15) Goal status: INITIAL  3.  Pt will improve FGA to 18/30 for decreased fall risk  Baseline: 12/30 (6/17), 10/30 (7/15) Goal status: INITIAL  4.  SARA to be assessed and LTG set Baseline: d/c goal due to presentation of symptoms and inconsistencies regarding ataxia Goal status: GOAL D/C   ASSESSMENT:  CLINICAL IMPRESSION: Emphasis of skilled PT session on midline orientation, reactive balance strategies, BLE strength and single leg stability. Pt continues to demonstrate dysmetria of BLEs (LLE>RLE) w/significant lateral lean to L side. Pt does improve w/use of neural priming but demonstrates delayed righting reactions and poor postural awareness. Pt reports her LUE will "drift away" from her now, unsure why. Continue POC.     OBJECTIVE IMPAIRMENTS: Abnormal gait, decreased balance, decreased coordination, decreased knowledge of condition, decreased mobility, difficulty walking, decreased ROM, decreased strength, impaired perceived functional ability, increased muscle spasms, impaired sensation, impaired UE functional use, and pain.   ACTIVITY LIMITATIONS: carrying, lifting, bending, standing, squatting, stairs,  and transfers  PARTICIPATION LIMITATIONS: meal prep, cleaning, driving, community activity, and occupation  PERSONAL FACTORS: Time since onset of injury/illness/exacerbation and 1-2 comorbidities:    Anxiety, asthma, depression, GERD, hypertension, seizures, subdural hematoma (2014)are also affecting patient's functional outcome.   REHAB POTENTIAL: Fair time since onset; unexplained origin of symptoms  CLINICAL DECISION MAKING: Stable/uncomplicated  EVALUATION COMPLEXITY: Moderate  PLAN:  PT FREQUENCY: 1-2x/week  PT DURATION: 6 weeks  PLANNED INTERVENTIONS: Therapeutic exercises, Therapeutic activity, Neuromuscular re-education, Balance training, Gait training, Patient/Family education, Self Care, Joint mobilization, Stair training, Vestibular training, Canalith repositioning, Visual/preceptual remediation/compensation, Orthotic/Fit training, DME instructions, Aquatic Therapy, Dry Needling, Electrical stimulation, Cryotherapy, Moist heat, Manual therapy, and Re-evaluation  PLAN FOR NEXT SESSION: add to HEP for LE strengthening and balance, work on improving control of ataxic movements (weighted vest, weights on limbs), quadruped as tolerated in clinic, hip abduction/ER strength. Pt responds well to neural priming     Josephine Igo, PT, DPT [facility] [address] [place], [place]  [address] Phone:  [phone] Fax:  [phone]  05/21/2023, 9:31 AM

## 2023-05-24 DIAGNOSIS — Z419 Encounter for procedure for purposes other than remedying health state, unspecified: Secondary | ICD-10-CM | POA: Diagnosis not present

## 2023-05-24 NOTE — Progress Notes (Signed)
Patient called and aware of results. Emphysema now seen on CT scan; patient denies dyspnea. Counseled on tobacco cessation. Will continue addressing at follow up visits.

## 2023-05-28 ENCOUNTER — Ambulatory Visit: Payer: Medicaid Other | Admitting: Occupational Therapy

## 2023-05-28 ENCOUNTER — Ambulatory Visit: Payer: Medicaid Other | Attending: Internal Medicine | Admitting: Physical Therapy

## 2023-05-28 ENCOUNTER — Encounter: Payer: Self-pay | Admitting: Occupational Therapy

## 2023-05-28 VITALS — BP 117/74 | HR 75

## 2023-05-28 DIAGNOSIS — R296 Repeated falls: Secondary | ICD-10-CM | POA: Diagnosis not present

## 2023-05-28 DIAGNOSIS — R2681 Unsteadiness on feet: Secondary | ICD-10-CM

## 2023-05-28 DIAGNOSIS — R278 Other lack of coordination: Secondary | ICD-10-CM | POA: Insufficient documentation

## 2023-05-28 DIAGNOSIS — R251 Tremor, unspecified: Secondary | ICD-10-CM

## 2023-05-28 DIAGNOSIS — M6281 Muscle weakness (generalized): Secondary | ICD-10-CM | POA: Diagnosis not present

## 2023-05-28 DIAGNOSIS — R2689 Other abnormalities of gait and mobility: Secondary | ICD-10-CM | POA: Insufficient documentation

## 2023-05-28 NOTE — Therapy (Signed)
OUTPATIENT PHYSICAL THERAPY NEURO TREATMENT - DISCHARGE NOTE   Patient Name: Cynthia Welch MRN: [id number] DOB:[birth year], 62 y.o., female Today's Date: 05/28/2023   PCP: Cynthia Crocker, MD REFERRING PROVIDER: Dickie La, MD  PHYSICAL THERAPY DISCHARGE SUMMARY  Visits from Start of Care: 6  Current functional level related to goals / functional outcomes: Mod I   Remaining deficits: Ongoing impaired balance, increased fall risk, ataxia   Education / Equipment: Handout for HEP   Patient agrees to discharge. Patient goals were partially met. Patient is being discharged due to lack of progress.   END OF SESSION:  PT End of Session - 05/28/23 0931     Visit Number 6    Number of Visits 7   with eval   Date for PT Re-Evaluation 06/18/23   to allow for scheduling delays   Authorization Type Medicaid [facility]    Authorization Time Period Approved 8 PT visits 04/23/2023-06/22/2023    Authorization - Number of Visits 8   approved 8 visits 04/23/23-06/22/23   PT Start Time 0930    PT Stop Time 1003   d/c   PT Time Calculation (min) 33 min    Equipment Utilized During Treatment Gait belt    Activity Tolerance Patient tolerated treatment well    Behavior During Therapy WFL for tasks assessed/performed                  Past Medical History:  Diagnosis Date   Anxiety    Childhood asthma    Depression    Fibroids    GERD (gastroesophageal reflux disease)    Headache    "monthly" (05/15/2017)   High cholesterol    Hypertension    Pneumonia    "twice when I was young" (05/15/2017)   PONV (postoperative nausea and vomiting)    in the past "used to get nauseous"   S/P TAH (total abdominal hysterectomy) 09/07/2015   Seizures (HCC) last sz 04/2017   "I've had grand mal; usually hit the floor and wake up w/in a minute or so;last one was today, 05/15/2017"   Subdural hematoma (HCC) 01/29/2013   Per MRI 01/2013; "had a seizure and busted my head open"   Past Surgical  History:  Procedure Laterality Date   ABDOMINAL HYSTERECTOMY N/A 09/07/2015   Procedure: HYSTERECTOMY ABDOMINAL, CYSTOTOMY WITH BLADDER REPAIR;  Surgeon: Cynthia Antigua, MD;  Location: [facility];  Service: Gynecology;  Laterality: N/A;  Requested 09/07/15 @ 2:15p   BILATERAL SALPINGECTOMY Bilateral 09/07/2015   Procedure: BILATERAL SALPINGECTOMY;  Surgeon: Cynthia Antigua, MD;  Location: [facility];  Service: Gynecology;  Laterality: Bilateral;   CHOLECYSTECTOMY N/A 06/08/2017   Procedure: LAPAROSCOPIC CHOLECYSTECTOMY WITH INTRAOPERATIVE CHOLANGIOGRAM;  Surgeon: Cynthia Rudd, MD;  Location: [facility] OR;  Service: General;  Laterality: N/A;   DIAGNOSTIC LAPAROSCOPY     DILATION AND CURETTAGE OF UTERUS     ESOPHAGOGASTRODUODENOSCOPY N/A 10/05/2016   Procedure: ESOPHAGOGASTRODUODENOSCOPY (EGD);  Surgeon: Cynthia Rakes, MD;  Location: [facility] ENDOSCOPY;  Service: Endoscopy;  Laterality: N/A;   FRACTURE SURGERY     ORIF ULNAR FRACTURE  09/03/2011   Procedure: OPEN REDUCTION INTERNAL FIXATION (ORIF) ULNAR FRACTURE;  Surgeon: Cynthia Welch;  Location: [facility] OR;  Service: Orthopedics;  Laterality: Right;   ORIF ULNAR FRACTURE  12/14/2011   Procedure: OPEN REDUCTION INTERNAL FIXATION (ORIF) ULNAR FRACTURE;  Surgeon: Cynthia Copa, MD;  [facility] OR;  Service: Orthopedics;  Laterality: Right;  Right ulnar shaft open reduction, internal fixation with right iliac crest bone graft  TONSILLECTOMY     Patient Active Problem List   Diagnosis Date Noted   Foot pain, right 04/03/2023   Tremor 04/03/2023   Neuropathic pain of forearm, right 11/14/2022   Spasms of the hands or feet 05/31/2022   Back pain 02/14/2022   Healthcare maintenance 05/01/2019   Macrocytosis without anemia 06/05/2017   Esophageal reflux 10/05/2016   Hypokalemia 01/29/2013   Seizure disorder (HCC) 08/10/2010   Tobacco use disorder 11/19/2008   HYPERCHOLESTEROLEMIA 10/31/2007   Hypertension 10/30/2007    ONSET DATE:  04/03/2023  REFERRING DIAG: R25.1 (ICD-10-CM) - Tremor  THERAPY DIAG:  Other lack of coordination  Muscle weakness (generalized)  Unsteadiness on feet  Tremor  Other abnormalities of gait and mobility  Repeated falls  Rationale for Evaluation and Treatment: Rehabilitation  SUBJECTIVE:                                                                                                                                                                                             SUBJECTIVE STATEMENT:  "Cynthia Welch"  Pt reports she has been doing okay, no falls. Pt does have ongoing frequent near falls. Pt reports that her balance is "not good at all", about the same as when she started therapy. Pt reports the muscle spasms in her legs have improved.  Pt with referral to neurology and is scheduled with Dr. Terrace Welch at [facility] on 07/06/23. She is on a wait-list to get in sooner if able.   Pt accompanied by: self and husband in lobby   PERTINENT HISTORY:  Anxiety, asthma, depression, GERD, hypertension, seizures, subdural hematoma (2014)  PAIN:  Are you having pain? No  PRECAUTIONS: Fall and Other: seizure  WEIGHT BEARING RESTRICTIONS: No  FALLS: Has patient fallen in last 6 months? Yes. Number of falls 2-3; frequent falls into walls; recent injury to R ankle with fall  LIVING ENVIRONMENT: Lives with: lives with their partner (boyfriend, Therapist, sports) Lives in: House/apartment Stairs: Yes: External: 5 steps; none (pt does avoid stairs due to decreased safety) Has following equipment at home: Single point cane and Walker - 2 wheeled  PLOF: Independent with gait and Independent with transfers  PATIENT GOALS: "to feel better, to be able to move around"  OBJECTIVE:   DIAGNOSTIC FINDINGS:  R ankle xray 04/03/23 IMPRESSION: 1. No acute fracture or dislocation of the right ankle. 2. Possible small ankle joint effusion.  R foot xray 04/03/23 IMPRESSION: No acute bony  abnormalities.  COGNITION: Overall cognitive status: Within functional limits for tasks assessed   SENSATION: N/T in hands and feet  COORDINATION: UE: Rapid alternating movements: WFL Finger to nose: dysmetric,  LUE worse than RUE LE: Slightly dysmetric heel to shin  POSTURE: rounded shoulders and forward head  LOWER EXTREMITY ROM:     Active  Right Eval Left Eval  Hip flexion    Hip extension    Hip abduction    Hip adduction    Hip internal rotation    Hip external rotation    Knee flexion    Knee extension Tight HS Tight HS  Ankle dorsiflexion    Ankle plantarflexion    Ankle inversion    Ankle eversion     (Blank rows = not tested)  LOWER EXTREMITY MMT:    MMT Right Eval Left Eval  Hip flexion 5 4  Hip extension    Hip abduction    Hip adduction    Hip internal rotation    Hip external rotation    Knee flexion 5 3  Knee extension 5 3  Ankle dorsiflexion 4 3  Ankle plantarflexion    Ankle inversion    Ankle eversion    (Blank rows = not tested)  VITALS  Vitals:   05/28/23 0935  BP: 117/74  Pulse: 75      TODAY'S TREATMENT:     Ther Act  Assessed vitals (see above) and WFL during session.  Reviewed several static standing balance exercises in the corner to add to HEP: Romberg stance with EC 3 x 30 sec each R modified tandem stance with EC 3 x 30 sec each L modified tandem stance with EO 3 x 30 sec each  Added balance exercises to HEP, see bolded below   PATIENT EDUCATION: Education details: Continue HEP, results of OM and functional implications, plan to d/c from OPPT this date Person educated: Patient Education method: Explanation and Demonstration Education comprehension: verbalized understanding and returned demonstration  HOME EXERCISE PROGRAM: Access Code: [email or web address] URL: [email or web address] Date: 04/23/2023 Prepared by: Alethia Berthold Plaster  Exercises - Seated march over  - 1 x daily - 7 x weekly - 3 sets - 10  reps - Bird Dog on Counter  - 1 x daily - 7 x weekly - 3 sets - 10 reps - Forward Step Down with Heel Tap and Rail Support  - 1 x daily - 7 x weekly - 3 sets - 10 reps - Lateral Step Up with Counter Support  - 1 x daily - 7 x weekly - 3 sets - 10 reps - Romberg Stance with Eyes Closed  - 1 x daily - 7 x weekly - 1 sets - 3 reps - 30 sec hold - Wide Tandem Stance with Eyes Open  - 1 x daily - 7 x weekly - 1 sets - 3 reps - 30 sec hold - Wide Tandem Stance with Eyes Closed  - 1 x daily - 7 x weekly - 1 sets - 3 reps - 30 sec hold  GOALS: Goals reviewed with patient? Yes  SHORT TERM GOALS: Target date: 04/30/2023  Pt will be independent with initial HEP for improved strength, balance, transfers and gait. Baseline: Goal status: MET  2.  Pt will improve 5 x STS to less than or equal to 18 seconds to demonstrate improved functional strength and transfer efficiency.  Baseline: 21.22 sec (6/17); 23.06s w/BUE support (7/8), 20.6 sec with hands on thighs (7/15) Goal status: IN PROGRESS  3.  Pt will improve FGA to 15/30 for decreased fall risk  Baseline: 12/30 (6/17), 10/30 (7/15) Goal status: NOT MET  4.  SARA to be assessed and STG set  Baseline:  Goal status: DC - not assessed prior to STG assessment    LONG TERM GOALS: Target date: 05/28/2023   Pt will be independent with final HEP for improved strength, balance, transfers and gait. Baseline:  Goal status: MET  2.  Pt will improve 5 x STS to less than or equal to 15 seconds to demonstrate improved functional strength and transfer efficiency.  Baseline: 21.22 sec (6/17), 20.6 sec with hands on thighs (7/15), 27.7 sec with hands on thighs (8/5) Goal status: NOT MET  3.  Pt will improve FGA to 18/30 for decreased fall risk  Baseline: 12/30 (6/17), 10/30 (7/15), 11/30 (8/5) Goal status: NOT MET  4.  SARA to be assessed and LTG set Baseline: d/c goal due to presentation of symptoms and inconsistencies regarding ataxia Goal status: GOAL  D/C   ASSESSMENT:  CLINICAL IMPRESSION: Emphasis of skilled PT session on reassessing LTG in preparation for d/c from OPPT services this date. Pt has only met 1/4 LTG due to being independent with her final HEP, added static standing balance exercises this date. SARA goal was d/c as pt's presentation of ataxia symptoms were inconsistent. Pt did not meet her 5xSTS or her FGA goal as she showed either worse scores or only slightly improved scores as compared to initial assessment. Pt has demonstrated minimal improvements in her balance during her PT POC and shown minimal benefit from PT services. Pt to be d/c from OPPT services at this time and is to continue with her HEP. Pt may benefit from PT services in the future pending a formal diagnosis regarding her tremors and ataxia but has maximized her rehab potential at this time.    OBJECTIVE IMPAIRMENTS: Abnormal gait, decreased balance, decreased coordination, decreased knowledge of condition, decreased mobility, difficulty walking, decreased ROM, decreased strength, impaired perceived functional ability, increased muscle spasms, impaired sensation, impaired UE functional use, and pain.   ACTIVITY LIMITATIONS: carrying, lifting, bending, standing, squatting, stairs, and transfers  PARTICIPATION LIMITATIONS: meal prep, cleaning, driving, community activity, and occupation  PERSONAL FACTORS: Time since onset of injury/illness/exacerbation and 1-2 comorbidities:    Anxiety, asthma, depression, GERD, hypertension, seizures, subdural hematoma (2014)are also affecting patient's functional outcome.   REHAB POTENTIAL: Fair time since onset; unexplained origin of symptoms  CLINICAL DECISION MAKING: Stable/uncomplicated  EVALUATION COMPLEXITY: Moderate      Peter Congo, PT, DPT, [facility] [address] [place], [place]  [address] Phone:  [phone] Fax:  [phone]  05/28/2023, 10:04 AM

## 2023-05-28 NOTE — Therapy (Signed)
OUTPATIENT OCCUPATIONAL THERAPY NEURO TREATMENT  Patient Name: Cynthia Welch [name]") MRN: [id number] DOB:Nov 17, 1960, 63 y.o., female Today's Date: 05/28/2023  PCP: Morene Crocker, MD REFERRING PROVIDER: Dickie La, MD  END OF SESSION:  OT End of Session - 05/28/23 0847     Visit Number 6    Number of Visits 9    Date for OT Re-Evaluation 06/16/23    Authorization Type [employer] - auth required/submitted    OT Start Time 0846    OT Stop Time 0920    OT Time Calculation (min) 34 min    Equipment Utilized During Treatment FM materials    Activity Tolerance Patient tolerated treatment well    Behavior During Therapy WFL for tasks assessed/performed             Past Medical History:  Diagnosis Date   Anxiety    Childhood asthma    Depression    Fibroids    GERD (gastroesophageal reflux disease)    Headache    "monthly" (05/15/2017)   High cholesterol    Hypertension    Pneumonia    "twice when I was young" (05/15/2017)   PONV (postoperative nausea and vomiting)    in the past "used to get nauseous"   S/P TAH (total abdominal hysterectomy) 09/07/2015   Seizures (HCC) last sz 04/2017   "I've had grand mal; usually hit the floor and wake up w/in a minute or so;last one was today, 05/15/2017"   Subdural hematoma (HCC) 01/29/2013   Per MRI 01/2013; "had a seizure and busted my head open"   Past Surgical History:  Procedure Laterality Date   ABDOMINAL HYSTERECTOMY N/A 09/07/2015   Procedure: HYSTERECTOMY ABDOMINAL, CYSTOTOMY WITH BLADDER REPAIR;  Surgeon: Catalina Antigua, MD;  Location: [facility];  Service: Gynecology;  Laterality: N/A;  Requested 09/07/15 @ 2:15p   BILATERAL SALPINGECTOMY Bilateral 09/07/2015   Procedure: BILATERAL SALPINGECTOMY;  Surgeon: Catalina Antigua, MD;  Location: [facility];  Service: Gynecology;  Laterality: Bilateral;   CHOLECYSTECTOMY N/A 06/08/2017   Procedure: LAPAROSCOPIC CHOLECYSTECTOMY WITH INTRAOPERATIVE CHOLANGIOGRAM;  Surgeon: Manus Rudd, MD;  Location: [facility] OR;  Service: General;  Laterality: N/A;   DIAGNOSTIC LAPAROSCOPY     DILATION AND CURETTAGE OF UTERUS     ESOPHAGOGASTRODUODENOSCOPY N/A 10/05/2016   Procedure: ESOPHAGOGASTRODUODENOSCOPY (EGD);  Surgeon: Charlott Rakes, MD;  [facility] ENDOSCOPY;  Service: Endoscopy;  Laterality: N/A;   FRACTURE SURGERY     ORIF ULNAR FRACTURE  09/03/2011   Procedure: OPEN REDUCTION INTERNAL FIXATION (ORIF) ULNAR FRACTURE;  Surgeon: Cammy Copa;  Location: [facility] OR;  Service: Orthopedics;  Laterality: Right;   ORIF ULNAR FRACTURE  12/14/2011   Procedure: OPEN REDUCTION INTERNAL FIXATION (ORIF) ULNAR FRACTURE;  Surgeon: Cammy Copa, MD;  Location: [facility] OR;  Service: Orthopedics;  Laterality: Right;  Right ulnar shaft open reduction, internal fixation with right iliac crest bone graft   TONSILLECTOMY     Patient Active Problem List   Diagnosis Date Noted   Foot pain, right 04/03/2023   Tremor 04/03/2023   Neuropathic pain of forearm, right 11/14/2022   Spasms of the hands or feet 05/31/2022   Back pain 02/14/2022   Healthcare maintenance 05/01/2019   Macrocytosis without anemia 06/05/2017   Esophageal reflux 10/05/2016   Hypokalemia 01/29/2013   Seizure disorder (HCC) 08/10/2010   Tobacco use disorder 11/19/2008   HYPERCHOLESTEROLEMIA 10/31/2007   Hypertension 10/30/2007    ONSET DATE: 04/03/2023  REFERRING DIAG: R25.1 (ICD-10-CM) - Tremor  THERAPY DIAG:  Other lack  of coordination  Muscle weakness (generalized)  Unsteadiness on feet  Tremor  Rationale for Evaluation and Treatment: Rehabilitation  SUBJECTIVE:   SUBJECTIVE STATEMENT:     A little achy last night so I took an Advil. No pain this morning. No falls  Pt with referral to neurology and is scheduled with Dr. Terrace Arabia at [facility] on 07/06/23. She is on a wait-list to get in sooner if able.  Pt accompanied by: self  PERTINENT HISTORY: Anxiety, asthma, depression, GERD, hypertension, seizures,  subdural hematoma (2014)   PER MD NOTE:  Tremor Patient recently seen in clinic on 6/4 for bilateral hand and lower extremity spasms noted to have a generalized tremor both at rest and with movement.    During examination, tremor was noted in head, voice. EOM intact without nystagmus/ Hands and torso tremor at rest and postural. No pill rolling, bradikinesia, or rigidity.    PAIN:  Are you having pain? NA upon arrival d/t not doing anything yet and using Volatren gel in the morning - Spasms increase her pain to 10/10 Pain location: hands, arms, legs Pain description: spasms ie) like a pulling muscle especially along the ulnar side of her phalange Aggravating factors: pumping containers Relieving factors: Voltaren gel, heat blanket (at nighttime)   PRECAUTIONS: Fall and Other: seizure   WEIGHT BEARING RESTRICTIONS: No   FALLS: Has patient fallen in last 6 months? Yes. Number of falls 2-3; frequent falls into walls; recent injury to R ankle with fall   LIVING ENVIRONMENT: Lives with: lives with their partner (boyfriend, Therapist, sports) Lives in: House/apartment Stairs: Yes: External: 5 steps; none (pt does avoid stairs due to decreased safety) Has following equipment at home: Single point cane and Walker - 2 wheeled   PLOF: Independent with BADLS, gait and Independent with transfers.  Was working full time but unable now   PATIENT GOALS: "to feel better, to be able to move around"   OBJECTIVE:    DIAGNOSTIC FINDINGS:  R ankle xray 04/03/23 IMPRESSION: 1. No acute fracture or dislocation of the right ankle. 2. Possible small ankle joint effusion.   R foot xray 04/03/23 IMPRESSION: No acute bony abnormalities.  HAND DOMINANCE: Right  ADLs: Overall ADLs: Transfers/ambulation related to ADLs: Eating: normally ok unless tremors bad Grooming: normally ok unless tremors bad UB Dressing: mod I  LB Dressing: mod I  Toileting: independent Bathing: mod I  Tub Shower transfers:  normally ok Equipment: none  IADLs: Shopping: Boyfriend does mostly Light housekeeping: Mod I when not having the episodes of tremors Meal Prep: fluctuating assistance Community mobility: relies on boyfriend d/t tremors  Medication management: independent Handwriting:  75-90% w/ mild shaking  MOBILITY STATUS: Independent and uses cane or walker only occasionally   FUNCTIONAL OUTCOME MEASURES: Upper Extremity Functional Scale (UEFS): 37.5%  UPPER EXTREMITY ROM:  BUE AROM WNL's w/ mild shaking   UPPER EXTREMITY MMT:   BUE MMT grossly 3+/5 at shoulders   HAND FUNCTION: Grip strength: Right: 24.9 lbs; Left: 18.2 lbs (with great effort)   COORDINATION: 9 Hole Peg test: Right: 33.54 sec; Left: 37.92 sec (with shaking, ataxia)  SENSATION: Fingertips feel dulled bilateral hands  EDEMA: noted mild MP joint swelling Rt hand d/t arthritis?    COGNITION: Overall cognitive status:  Pt reports she forgets things  VISION: Subjective report: no changes, I've worn glasses since I was born Baseline vision: Wears glasses all the time and Legally blind Visual history:  pt reports she's legally blind  PERCEPTION: Not tested  PRAXIS: Not tested  OBSERVATIONS: Pt reports dropping things a lot from Rt hand more than Lt hand   TODAY'S TREATMENT:         Discussed lack of progress (see LTG's below). Pt reports HEP's are helping but symptoms fluctuate. Pt/therapist unable to id any further O.T. needs at this time. Grip strength has mildly improved, however coordination has declined  UEFS = 57.5%   (pt perceived difficulty with tasks has improved)                                                                                                                 PATIENT EDUCATION: See above  HOME EXERCISE PROGRAM: 04/24/23 - coordination exercises with images 05/01/23: strategies for tremors and safety strategies 05/07/23: putty HEP, theraband HEP    GOALS: Goals reviewed with patient?  Yes  SHORT TERM GOALS: Target date: 05/16/23  Independent with coordination and putty HEP  Baseline: Not yet issued/addressed Goal status: MET  2.  Pt to verbalize understanding with safety strategies and A/E to promote safety and prevent drops/spills Baseline: Not yet issued/addressed Goal status: MET  3.  Independent with BUE strengthening HEP  Baseline: Not yet issued/addressed Goal status: MET   LONG TERM GOALS: Target date: 06/16/23  Pt to improve grip strength bilaterally by 5 lbs or more Baseline: Rt = 24, Lt = 18 Goal status: NOT MET (Rt = 25 lbs, Lt = 20-24 lbs, inconsistent)   2.  Pt to improve coordination as evidenced by reducing speed on 9 hole peg test 5 sec. Baseline: Rt = 33 sec, Lt = 37 sec Goal status: NOT MET (RT = 45 sec, Lt = 1 min. 13 sec) - pt more jerky today  3.  Pt to improve overall UE use as evidenced by increase on UEFI scale to 45% or higher Baseline: 37.5% Goal status:  MET (57.5%)     ASSESSMENT:  CLINICAL IMPRESSION:  Patient has met all STG's, but only 1 LTG.  Pt with sporadic jerky movements. Pt has improved somewhat in grip strength but coordination has declined - pt worse some days and inconsistent with coordination.   PERFORMANCE DEFICITS: in functional skills including ADLs, IADLs, coordination, dexterity, sensation, strength, pain, muscle spasms, Fine motor control, Gross motor control, mobility, body mechanics, decreased knowledge of use of DME, and UE functional use.   IMPAIRMENTS: are limiting patient from ADLs, IADLs, rest and sleep, work, and leisure.   CO-MORBIDITIES: may have co-morbidities  that affects occupational performance. Patient will benefit from skilled OT to address above impairments and improve overall function.  REHAB POTENTIAL: Good   PLAN:  OT FREQUENCY: 1x/week  OT DURATION: 8 weeks (plus eval)   PLANNED INTERVENTIONS: self care/ADL training, therapeutic exercise, therapeutic activity, neuromuscular  re-education, manual therapy, passive range of motion, functional mobility training, splinting, paraffin, fluidotherapy, moist heat, cryotherapy, contrast bath, patient/family education, energy conservation, coping strategies training, and DME and/or AE instructions  RECOMMENDED OTHER SERVICES: none at this time  CONSULTED AND AGREED WITH PLAN OF  CARE: Patient  PLAN; D/C O.T.    OCCUPATIONAL THERAPY DISCHARGE SUMMARY  Visits from Start of Care: 6  Current functional level related to goals / functional outcomes: SEE ABOVE   Remaining deficits: Jerky sporadic movements Weakness   Education / Equipment: HEP's, safety recommendations including A/E recommendations   Patient agrees to discharge. Patient goals were partially met. Patient is being discharged due to lack of progress.Sheran Lawless, OT 05/28/2023, 9:20 AM

## 2023-06-04 ENCOUNTER — Encounter: Payer: No Typology Code available for payment source | Admitting: Occupational Therapy

## 2023-06-11 ENCOUNTER — Encounter: Payer: No Typology Code available for payment source | Admitting: Occupational Therapy

## 2023-06-18 ENCOUNTER — Encounter: Payer: No Typology Code available for payment source | Admitting: Occupational Therapy

## 2023-06-20 ENCOUNTER — Other Ambulatory Visit (HOSPITAL_COMMUNITY): Payer: Self-pay

## 2023-06-20 ENCOUNTER — Other Ambulatory Visit: Payer: Self-pay | Admitting: Student

## 2023-06-20 ENCOUNTER — Other Ambulatory Visit: Payer: Self-pay

## 2023-06-20 DIAGNOSIS — I10 Essential (primary) hypertension: Secondary | ICD-10-CM

## 2023-06-20 MED ORDER — AMLODIPINE BESYLATE 10 MG PO TABS
10.0000 mg | ORAL_TABLET | Freq: Every day | ORAL | 11 refills | Status: DC
  Filled 2023-06-20: qty 30, 30d supply, fill #0
  Filled 2023-08-23: qty 30, 30d supply, fill #1
  Filled 2023-10-23: qty 30, 30d supply, fill #2
  Filled 2023-11-26: qty 30, 30d supply, fill #3
  Filled 2023-12-23: qty 30, 30d supply, fill #4
  Filled 2024-02-19: qty 30, 30d supply, fill #5
  Filled 2024-03-27: qty 30, 30d supply, fill #6

## 2023-06-20 MED ORDER — LOSARTAN POTASSIUM 25 MG PO TABS
25.0000 mg | ORAL_TABLET | Freq: Every day | ORAL | 11 refills | Status: DC
Start: 2023-06-20 — End: 2024-03-27
  Filled 2023-06-20: qty 30, 30d supply, fill #0
  Filled 2023-08-23: qty 30, 30d supply, fill #1
  Filled 2023-10-23: qty 30, 30d supply, fill #2
  Filled 2023-11-26: qty 30, 30d supply, fill #3
  Filled 2023-12-23: qty 30, 30d supply, fill #4
  Filled 2024-02-19: qty 30, 30d supply, fill #5
  Filled 2024-03-27: qty 30, 30d supply, fill #6

## 2023-06-21 ENCOUNTER — Other Ambulatory Visit (HOSPITAL_COMMUNITY): Payer: Self-pay

## 2023-06-22 ENCOUNTER — Other Ambulatory Visit (HOSPITAL_COMMUNITY): Payer: Self-pay

## 2023-06-24 DIAGNOSIS — Z419 Encounter for procedure for purposes other than remedying health state, unspecified: Secondary | ICD-10-CM | POA: Diagnosis not present

## 2023-06-27 ENCOUNTER — Telehealth: Payer: Self-pay | Admitting: *Deleted

## 2023-06-27 NOTE — Telephone Encounter (Signed)
Call from patient states got up and leg just got weak and she fell down.  May have twisted ankle.  Patient has experienced this before and has been to Out Patient PT for it.  Ankle is a little swollen.Has put ice on her ankle and propping it up does not want to go to the Urgent Care or ER.  Just wants to get a follow up appointment.  No Nausea or HA.  Vision is bad anyway.  Will go to an Urgent Care or the ER on tomorrow.Cynthia Welch ok for now.  OK to call and speak with patient .

## 2023-07-02 ENCOUNTER — Encounter: Payer: No Typology Code available for payment source | Admitting: Student

## 2023-07-06 ENCOUNTER — Ambulatory Visit: Payer: Medicaid Other | Admitting: Neurology

## 2023-07-06 ENCOUNTER — Encounter: Payer: Self-pay | Admitting: Neurology

## 2023-07-06 VITALS — BP 124/86 | HR 96 | Ht 68.0 in | Wt 140.0 lb

## 2023-07-06 DIAGNOSIS — R531 Weakness: Secondary | ICD-10-CM

## 2023-07-06 DIAGNOSIS — R200 Anesthesia of skin: Secondary | ICD-10-CM | POA: Insufficient documentation

## 2023-07-06 DIAGNOSIS — W19XXXA Unspecified fall, initial encounter: Secondary | ICD-10-CM | POA: Insufficient documentation

## 2023-07-06 DIAGNOSIS — W19XXXS Unspecified fall, sequela: Secondary | ICD-10-CM | POA: Diagnosis not present

## 2023-07-06 NOTE — Progress Notes (Signed)
Chief Complaint  Patient presents with   New Patient (Initial Visit)    Rm14, kelvin husband present, internal referral for Spasms of the hands and feet, resting tremor. Pt fell 2 weeks ago due to left leg numbness      ASSESSMENT AND PLAN  Cynthia Welch is a 62 y.o. female   History of subdural hematoma, seizure, History of heavy alcohol abuse in the past, New onset upper extremity paresthesia, intermittent left leg weakness, gait abnormality,  MRI of the brain with without contrast to rule out right hemisphere pathology,  Brisk reflex on examination, MRI of the cervical spine to rule out cervical pathology  DIAGNOSTIC DATA (LABS, IMAGING, TESTING) - I reviewed patient records, labs, notes, testing and imaging myself where available.   MEDICAL HISTORY:  Cynthia Welch, is a 62 year old female seen in request by her primary care doctor Morene Crocker, for evaluation of gait abnormality  I reviewed and summarized the referring note.PMHx HTN HLD Hx of subdural hematoma in 2014,  Hx of seizure Right ulnar fracture,   I saw her in 2018 for seizure, at that time, she has heavy alcohol abuse, malnutrition of moderate degree, abnormal liver functional test, multiple hospital admission in 2018, found unresponsive by family members, alcohol level was 260 upon admission, critical low potassium less than 2, sodium was 120, she was diagnosed with binge drinking versus seizure,  She was treated with Keppra, later zonisamide, no recurrent seizure  MRI of the brain in 2018 showed mild generalized atrophy, no acute abnormality, shallow bilateral subdural collection at the right side, she did have a history of subdural hematoma, right more than left in 2014  She has been seizure-free since 2018, no longer taking epileptic medications, try to go back working force hours after failed disability application, worked as housekeeping  Over past few months, she noticed increased  difficulty, bilateral arm and hands weakness, could no longer keep up, fell 2 weeks ago, getting up from seated position, felt her left leg was not there, no loss of consciousness, but failed to keep her balance, twisted her left ankle   She reported only drinks beer occasionally only, smoke few cigarettes,  Personally reviewed most recent MRI of brain in 2018. 1. Interval resolution of bilateral subdural collections. 2. Stable mild atrophy and white matter disease. This is mildly advanced for age. This may be related to alcohol abuse or chronic microvascular ischemia. 3. No acute intracranial abnormality. 4. No discrete seizure focus.  Laboratory evaluation in May 2024, A1c 5.8, normal magnesium, B12, TSH, significantly decreased vitamin D 5.8, was given B12 supplement, BMP,    PHYSICAL EXAM:   Vitals:   07/06/23 0918  BP: 124/86  Pulse: 96  Weight: 140 lb (63.5 kg)  Height: 5\' 8"  (1.727 m)   Body mass index is 21.29 kg/m.  PHYSICAL EXAMNIATION:  Gen: NAD, conversant, well nourised, well groomed                     Cardiovascular: Regular rate rhythm, no peripheral edema, warm, nontender. Eyes: Conjunctivae clear without exudates or hemorrhage Neck: Supple, no carotid bruits. Pulmonary: Clear to auscultation bilaterally   NEUROLOGICAL EXAM:  MENTAL STATUS: Speech/cognition: Awake, alert, oriented to history taking and casual conversation CRANIAL NERVES: CN II: Visual fields are full to confrontation. Pupils are round equal and briskly reactive to light. CN III, IV, VI: extraocular movement are normal. No ptosis. CN V: Facial sensation is intact to light touch CN VII: Face  is symmetric with normal eye closure  CN VIII: Hearing is normal to causal conversation. CN IX, X: Phonation is normal. CN XI: Head turning and shoulder shrug are intact  MOTOR: There is no pronator drift of out-stretched arms. Muscle bulk and tone are normal. Muscle strength is normal.  Left foot  swelling, tenderness upon deep palpitation,  REFLEXES: Reflexes are 2+ and symmetric at the biceps, triceps, knees, and ankles. Plantar responses are flexor.  SENSORY: Intact to light touch, pinprick and vibratory sensation are intact in fingers and toes.  COORDINATION: There is no trunk or limb dysmetria noted.  GAIT/STANCE: Antalgic due to left foot pain  REVIEW OF SYSTEMS:  Full 14 system review of systems performed and notable only for as above All other review of systems were negative.   ALLERGIES: Allergies  Allergen Reactions   Acetaminophen Itching    HOME MEDICATIONS: Current Outpatient Medications  Medication Sig Dispense Refill   amLODipine (NORVASC) 10 MG tablet Take 1 tablet (10 mg total) by mouth daily. 30 tablet 11   Capsaicin-Menthol-Methyl Sal (CAPSAICIN-METHYL SAL-MENTHOL) 0.025-1-12 % CREA Apply 1 Application topically 4 (four) times daily as needed. 56.6 g 2   diclofenac Sodium (VOLTAREN ARTHRITIS PAIN) 1 % GEL Apply 4 g topically 4 (four) times daily. 100 g 1   losartan (COZAAR) 25 MG tablet Take 1 tablet (25 mg total) by mouth daily. 30 tablet 11   Multiple Vitamin (MULTIVITAMIN WITH MINERALS) TABS tablet Take 1 tablet by mouth daily. 30 tablet 0   potassium chloride SA (KLOR-CON M) 20 MEQ tablet Take 1 tablet (20 mEq total) by mouth every other day. 30 tablet 0   rosuvastatin (CRESTOR) 10 MG tablet Take 1 tablet (10 mg total) by mouth daily. 30 tablet 11   No current facility-administered medications for this visit.    PAST MEDICAL HISTORY: Past Medical History:  Diagnosis Date   Anxiety    Childhood asthma    Depression    Fibroids    GERD (gastroesophageal reflux disease)    Headache    "monthly" (05/15/2017)   High cholesterol    Hypertension    Pneumonia    "twice when I was young" (05/15/2017)   PONV (postoperative nausea and vomiting)    in the past "used to get nauseous"   S/P TAH (total abdominal hysterectomy) 09/07/2015   Seizures  (HCC) last sz 04/2017   "I've had grand mal; usually hit the floor and wake up w/in a minute or so;last one was today, 05/15/2017"   Subdural hematoma (HCC) 01/29/2013   Per MRI 01/2013; "had a seizure and busted my head open"    PAST SURGICAL HISTORY: Past Surgical History:  Procedure Laterality Date   ABDOMINAL HYSTERECTOMY N/A 09/07/2015   Procedure: HYSTERECTOMY ABDOMINAL, CYSTOTOMY WITH BLADDER REPAIR;  Surgeon: Catalina Antigua, MD;  Location: [facility];  Service: Gynecology;  Laterality: N/A;  Requested 09/07/15 @ 2:15p   BILATERAL SALPINGECTOMY Bilateral 09/07/2015   Procedure: BILATERAL SALPINGECTOMY;  Surgeon: Catalina Antigua, MD;  Location: [facility];  Service: Gynecology;  Laterality: Bilateral;   CHOLECYSTECTOMY N/A 06/08/2017   Procedure: LAPAROSCOPIC CHOLECYSTECTOMY WITH INTRAOPERATIVE CHOLANGIOGRAM;  Surgeon: Manus Rudd, MD;  Location: [facility];  Service: General;  Laterality: N/A;   DIAGNOSTIC LAPAROSCOPY     DILATION AND CURETTAGE OF UTERUS     ESOPHAGOGASTRODUODENOSCOPY N/A 10/05/2016   Procedure: ESOPHAGOGASTRODUODENOSCOPY (EGD);  Surgeon: [place]ott Rakes, MD;  Location[facility];  Service: Endoscopy;  Laterality: N/A;   FRACTURE SURGERY  ORIF ULNAR FRACTURE  09/03/2011   Procedure: OPEN REDUCTION INTERNAL FIXATION (ORIF) ULNAR FRACTURE;  Surgeon: Cammy Copa;  Location: [facility] OR;  Service: Orthopedics;  Laterality: Right;   ORIF ULNAR FRACTURE  12/14/2011   Procedure: OPEN REDUCTION INTERNAL FIXATION (ORIF) ULNAR FRACTURE;  Surgeon: Cammy Copa, MD;  Location: [facility] OR;  Service: Orthopedics;  Laterality: Right;  Right ulnar shaft open reduction, internal fixation with right iliac crest bone graft   TONSILLECTOMY      FAMILY HISTORY: Family History  Problem Relation Age of Onset   Cirrhosis Mother    Diabetes Mellitus I Mother    Breast cancer Mother    Esophageal cancer Father    Hypertension Father    Breast cancer Sister    Cancer Sister    Heart attack  Brother     SOCIAL HISTORY: Social History   Socioeconomic History   Marital status: Significant Other    Spouse name: kelvin   Number of children: 0   Years of education: HS   Highest education level: 12th grade  Occupational History   Occupation: Unemployed  Tobacco Use   Smoking status: Every Day    Current packs/day: 0.50    Average packs/day: 0.5 packs/day for 41.0 years (20.5 ttl pk-yrs)    Types: Cigarettes   Smokeless tobacco: Never  Vaping Use   Vaping status: Every Day   Substances: Nicotine, Flavoring  Substance and Sexual Activity   Alcohol use: Yes    Alcohol/week: 6.0 standard drinks of alcohol    Types: 6 Cans of beer per week    Comment: occ.   Drug use: No   Sexual activity: Yes    Birth control/protection: None, Surgical  Other Topics Concern   Not on file  Social History Narrative   Lives at home with boyfriend.   Right-handed.   1 cup caffeine per day.   Social Determinants of Health   Financial Resource Strain: Not on file  Food Insecurity: No Food Insecurity (10/26/2021)   Hunger Vital Sign    Worried About Running Out of Food in the Last Year: Never true    Ran Out of Food in the Last Year: Never true  Transportation Needs: No Transportation Needs (10/26/2021)   PRAPARE - Administrator, Civil Service (Medical): No    Lack of Transportation (Non-Medical): No  Physical Activity: Unknown (09/20/2017)   Exercise Vital Sign    Days of Exercise per Week: Patient declined    Minutes of Exercise per Session: Patient declined  Stress: Not on file  Social Connections: Unknown (09/20/2017)   Social Connection and Isolation Panel [NHANES]    Frequency of Communication with Friends and Family: Patient declined    Frequency of Social Gatherings with Friends and Family: Patient declined    Attends Religious Services: Patient declined    Database administrator or Organizations: Patient declined    Attends Banker Meetings:  Patient declined    Marital Status: Patient declined  Intimate Partner Violence: Unknown (09/20/2017)   Humiliation, Afraid, Rape, and Kick questionnaire    Fear of Current or Ex-Partner: Patient declined    Emotionally Abused: Patient declined    Physically Abused: Patient declined    Sexually Abused: Patient declined      Levert Feinstein, M.D. Ph.D.  [facility] [address] [place], [place] [address] Ph: [phone] Fax: [phone]  CC:  Tyson Alias, MD [address] [place] [phone]  [place],  [place] [address]  Morene Crocker, MD

## 2023-07-13 ENCOUNTER — Telehealth: Payer: Self-pay | Admitting: Neurology

## 2023-07-13 NOTE — Telephone Encounter (Signed)
MRI brain [employer] medicaid auth: [id number] exp. 07/11/23-09/09/23 sent to GI [phone]   The MRI cervical spine is not getting approved right now. They want the results of the MRI brain before they will approve it, or I can get the information for you to do a peer to peer.

## 2023-07-16 NOTE — Telephone Encounter (Signed)
Ok, will see MRI brain first.

## 2023-07-24 DIAGNOSIS — Z419 Encounter for procedure for purposes other than remedying health state, unspecified: Secondary | ICD-10-CM | POA: Diagnosis not present

## 2023-07-25 ENCOUNTER — Encounter: Payer: Self-pay | Admitting: Student

## 2023-07-25 ENCOUNTER — Ambulatory Visit (HOSPITAL_COMMUNITY)
Admission: RE | Admit: 2023-07-25 | Discharge: 2023-07-25 | Disposition: A | Discharge status: Home / Self Care | Payer: Medicaid Other | Source: Ambulatory Visit | Attending: Internal Medicine | Admitting: Internal Medicine

## 2023-07-25 ENCOUNTER — Ambulatory Visit: Payer: Medicaid Other | Admitting: Student

## 2023-07-25 VITALS — BP 123/83 | HR 83 | Temp 98.3°F | Wt 142.7 lb

## 2023-07-25 DIAGNOSIS — W19XXXD Unspecified fall, subsequent encounter: Secondary | ICD-10-CM

## 2023-07-25 DIAGNOSIS — S93402A Sprain of unspecified ligament of left ankle, initial encounter: Secondary | ICD-10-CM | POA: Insufficient documentation

## 2023-07-25 DIAGNOSIS — R296 Repeated falls: Secondary | ICD-10-CM | POA: Diagnosis not present

## 2023-07-25 DIAGNOSIS — M7989 Other specified soft tissue disorders: Secondary | ICD-10-CM | POA: Diagnosis not present

## 2023-07-25 DIAGNOSIS — M25572 Pain in left ankle and joints of left foot: Secondary | ICD-10-CM | POA: Diagnosis not present

## 2023-07-25 NOTE — Progress Notes (Unsigned)
CC: Acute Concern of left ankle injury  HPI:  Cynthia Welch is a 62 y.o. female with pertinent PMH of hypertension and recent recurrent falls being worked up by neurology who presents to the clinic after a fall 2 weeks ago with persistent left ankle pain and swelling. Please see assessment and plan below for further details.  Past Medical History:  Diagnosis Date   Anxiety    Childhood asthma    Depression    Fibroids    GERD (gastroesophageal reflux disease)    Headache    "monthly" (05/15/2017)   High cholesterol    Hypertension    Pneumonia    "twice when I was young" (05/15/2017)   PONV (postoperative nausea and vomiting)    in the past "used to get nauseous"   S/P TAH (total abdominal hysterectomy) 09/07/2015   Seizures (HCC) last sz 04/2017   "I've had grand mal; usually hit the floor and wake up w/in a minute or so;last one was today, 05/15/2017"   Subdural hematoma (HCC) 01/29/2013   Per MRI 01/2013; "had a seizure and busted my head open"    Current Outpatient Medications  Medication Instructions   amLODipine (NORVASC) 10 mg, Oral, Daily   Capsaicin-Menthol-Methyl Sal (CAPSAICIN-METHYL SAL-MENTHOL) 0.025-1-12 % CREA 1 Application, Apply externally, 4 times daily PRN   diclofenac Sodium (VOLTAREN ARTHRITIS PAIN) 4 g, Topical, 4 times daily   losartan (COZAAR) 25 mg, Oral, Daily   Multiple Vitamin (MULTIVITAMIN WITH MINERALS) TABS tablet 1 tablet, Oral, Daily   potassium chloride SA (KLOR-CON M) 20 MEQ tablet 20 mEq, Oral, Every other day   rosuvastatin (CRESTOR) 10 mg, Oral, Daily     Review of Systems:   Pertinent items noted in HPI and/or A&P.  Physical Exam:  Vitals:   07/25/23 0934  BP: 123/83  Pulse: 83  Temp: 98.3 F (36.8 C)  TempSrc: Oral  SpO2: 100%  Weight: 142 lb 11.2 oz (64.7 kg)    Constitutional: Slightly uncomfortable but overall well-appearing female. In no acute distress. MSK: Swelling around the left ankle joint without significant  erythema.  Tenderness to palpation primarily over the dorsal aspect of the cuboid on the left foot, mild soft tissue tenderness around the left ankle generally.  No bony tenderness over the malleoli, calcaneus, base of the fifth metatarsal, or with fibular squeeze.  Normal active and passive range of motion without abnormal anterior/posterior drawer test.  Neurovascularly intact. Skin:Warm and dry. Neuro:Alert and oriented x3. No focal deficit noted.   Assessment & Plan:   Sprain of left ankle Patient presents today with an acute concern of left ankle injury that occurred 2 weeks ago.  She has ongoing issues with falls that occur when she feels like her leg is no longer there or does not work properly.  She is scheduled for MRI of the brain and cervical spine later this month and will follow-up with neurology.  Unfortunately when she fell 2 weeks ago she had an inversion injury of her left ankle and was nonweightbearing on it for 3 to 4 days afterwards.  She still has significant swelling and moderate pain but is able to walk on it now.  Exam does not show any specific bony tenderness on the malleoli, base of the fifth metatarsal, calcaneus, or with fibular squeeze.  She does have the most tenderness over the cuboid on the dorsal aspect.  There is no increased anterior glide, or other signs of joint instability or ligament rupture.  Due to  her nonweightbearing after the injury we will further evaluate with x-ray images and otherwise treat conservatively with cold compress, rest, elevation, compressive wraps, Voltaren gel, ibuprofen 400 to 600 mg every 8 hours since she is intolerant to Tylenol.    Patient discussed with Dr. Alfonzo Beers, DO [facility] Internal Medicine Resident PGY-2 Clinic Phone: [phone] Pager: [phone]

## 2023-07-25 NOTE — Patient Instructions (Addendum)
  Thank you, [name], for allowing [place] to provide your care today. Today we discussed . . .  > Ankle pain       -Based on your exam and current symptoms I think you sprained a ligament in your ankle.  I do not think that you fractured any bones but to be safe we will get an x-ray of your ankle.  I will call you with the results of the x-ray.  Please continue to rest, use cold compression, use compression wrap therapy, topical Voltaren gel, and you can take ibuprofen 400-600 mg every 6-8 hours as needed.  If you are feeling unstable on your left ankle you can also pick up an ankle brace at the pharmacy that can provide some support while you continue to heal.  We are also going to try to get you a walker that may provide more stability until we understand why you are having these falls.  If you have any worsening pain, or other concerning symptoms please do not hesitate to give [place] a call.  I hope you continue to heal and I have attached the information for your MRIs below.  Please give them a call to confirm.  08/19/2023 at 1:20 PM [facility] [address] [place], [place], [place] [address] Phone: [phone]    Follow up: 2 months    Remember:     Should you have any questions or concerns please call the internal medicine clinic at [phone].     Rocky Morel, DO [facility]

## 2023-07-26 DIAGNOSIS — S93402A Sprain of unspecified ligament of left ankle, initial encounter: Secondary | ICD-10-CM | POA: Insufficient documentation

## 2023-07-26 NOTE — Assessment & Plan Note (Signed)
Patient presents today with an acute concern of left ankle injury that occurred 2 weeks ago.  She has ongoing issues with falls that occur when she feels like her leg is no longer there or does not work properly.  She is scheduled for MRI of the brain and cervical spine later this month and will follow-up with neurology.  Unfortunately when she fell 2 weeks ago she had an inversion injury of her left ankle and was nonweightbearing on it for 3 to 4 days afterwards.  She still has significant swelling and moderate pain but is able to walk on it now.  Exam does not show any specific bony tenderness on the malleoli, base of the fifth metatarsal, calcaneus, or with fibular squeeze.  She does have the most tenderness over the cuboid on the dorsal aspect.  There is no increased anterior glide, or other signs of joint instability or ligament rupture.  Due to her nonweightbearing after the injury we will further evaluate with x-ray images and otherwise treat conservatively with cold compress, rest, elevation, compressive wraps, Voltaren gel, ibuprofen 400 to 600 mg every 8 hours since she is intolerant to Tylenol.

## 2023-07-27 NOTE — Progress Notes (Signed)
Internal Medicine Clinic Attending  Case discussed with Dr. Nikki Dom  At the time of the visit.  We reviewed the resident's history and exam and pertinent patient test results.  I agree with the assessment, diagnosis, and plan of care documented in the resident's note.

## 2023-08-13 IMAGING — MG MM DIGITAL SCREENING BILAT W/ TOMO AND CAD
6 of 10 series · 6 of 30 positions shown · non-contrast
Comparison: Previous exam(s).

CLINICAL DATA: Screening.

EXAM:
DIGITAL SCREENING BILATERAL MAMMOGRAM WITH TOMOSYNTHESIS AND CAD
TECHNIQUE: Bilateral screening digital craniocaudal and mediolateral oblique
mammograms were obtained. Bilateral screening digital breast
tomosynthesis was performed. The images were evaluated with
computer-aided detection.

[L CC synth-2D]
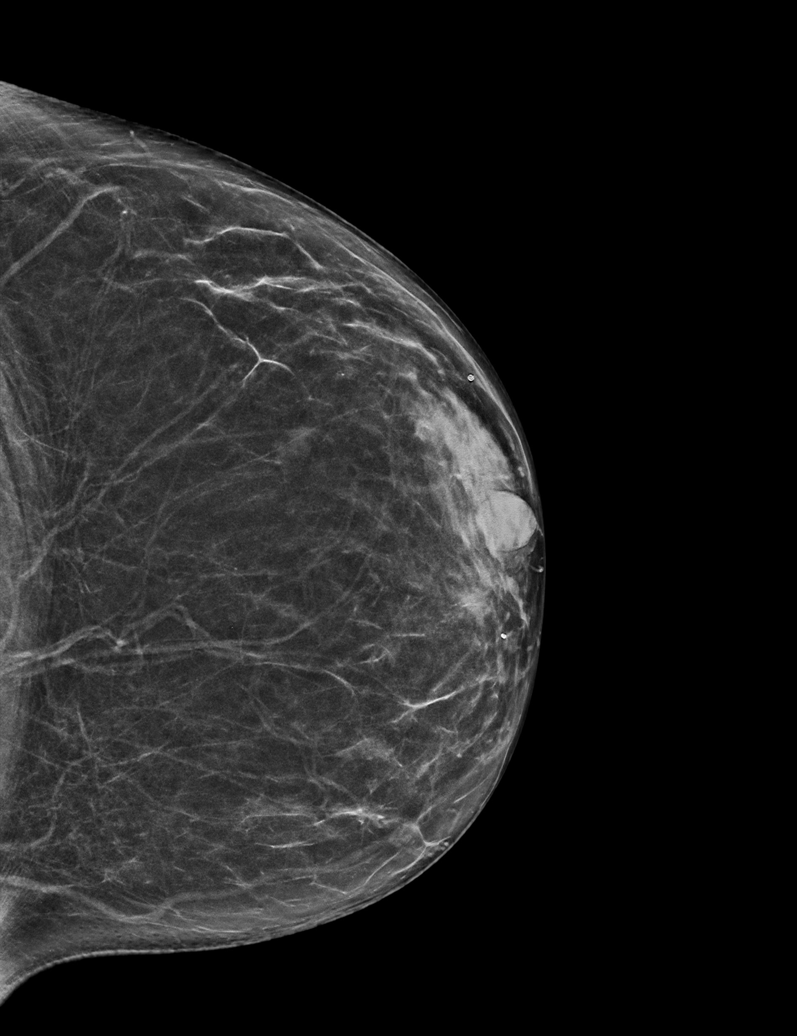

[L MLO synth-2D (1 of 2)]
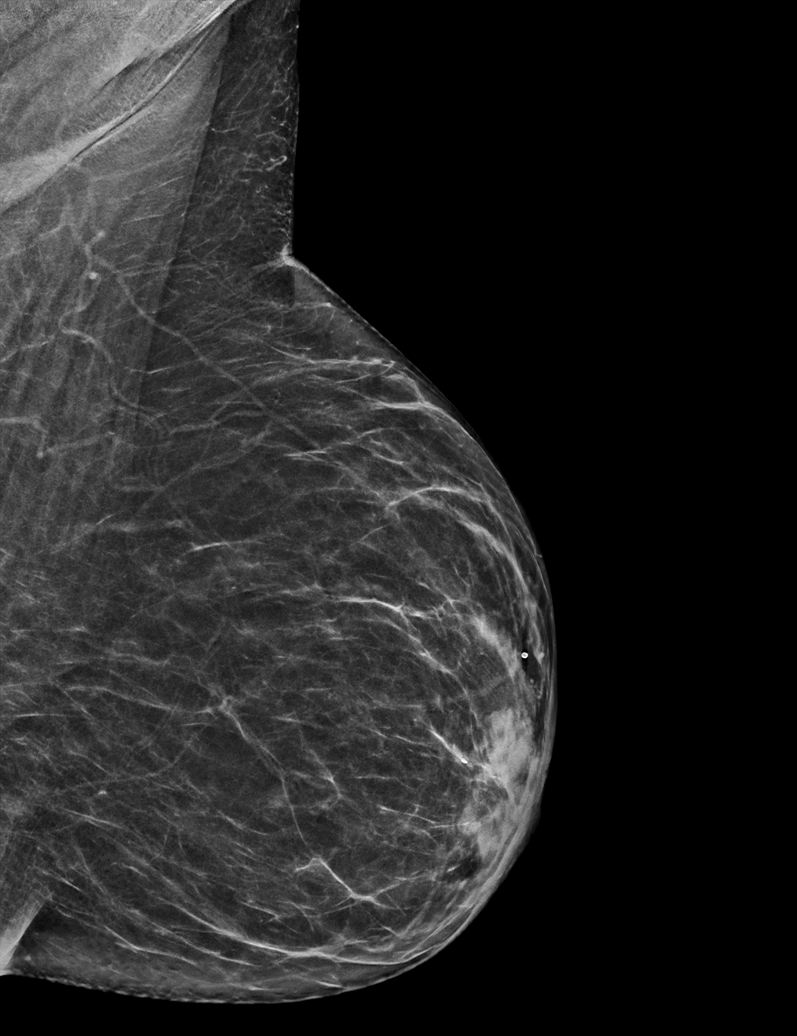

[R MLO synth-2D]
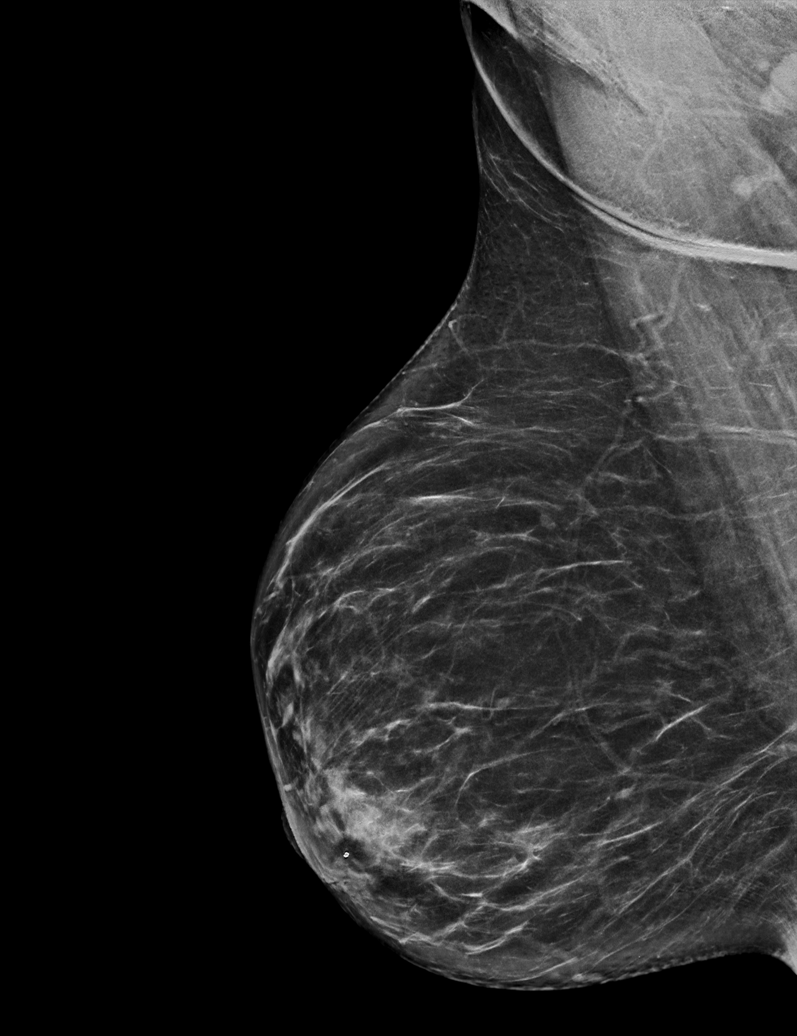

[R CC synth-2D]
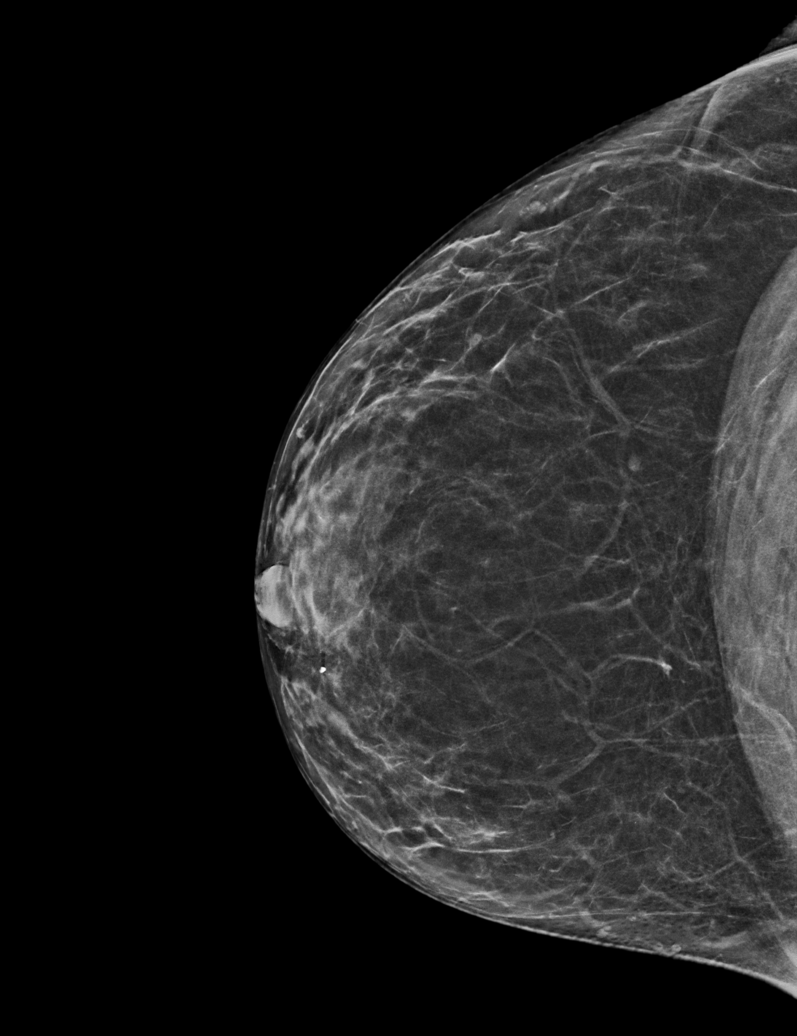

[L MLO synth-2D (2 of 2)]
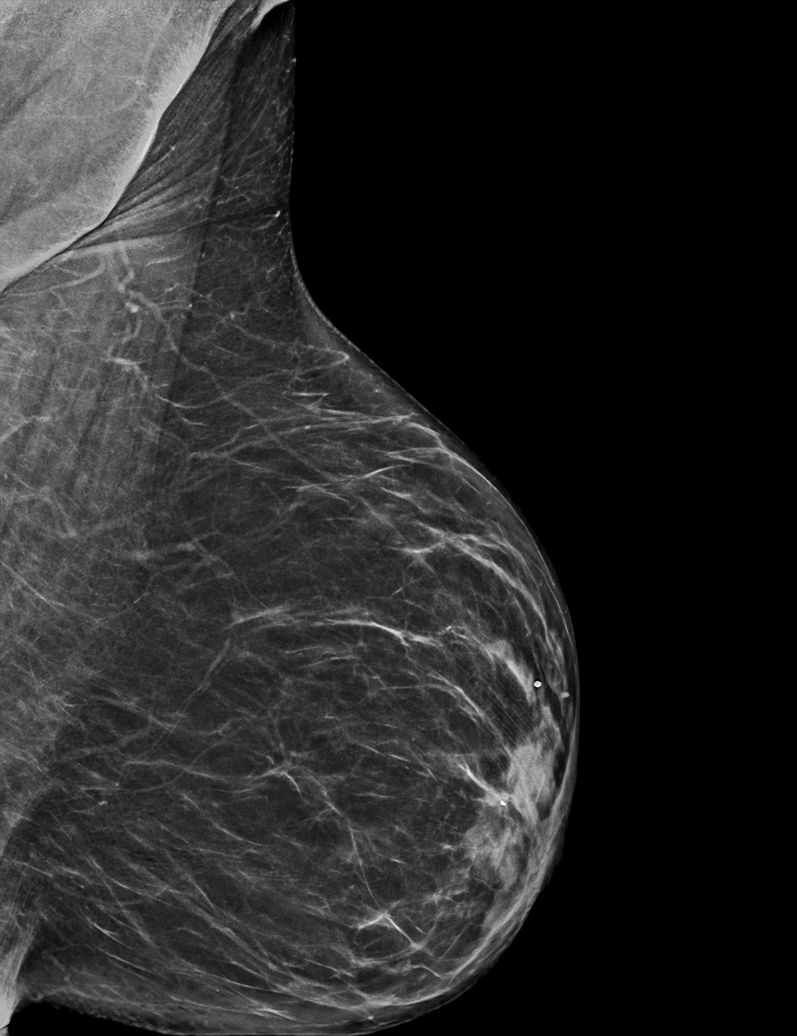

[R MLO tomo · tomo slice 35/68.0]
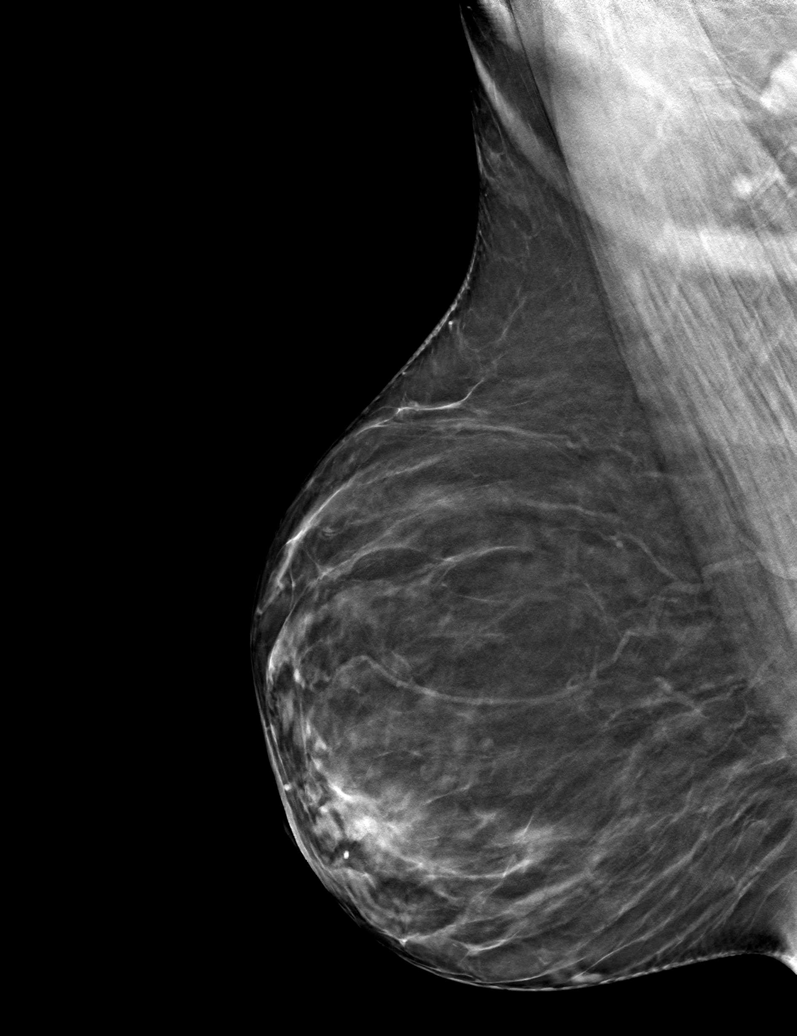

[6 of 30 positions shown; findings below may reference images not displayed]

ACR Breast Density Category b: There are scattered areas of
fibroglandular density.
FINDINGS: There are no findings suspicious for malignancy.
IMPRESSION: No mammographic evidence of malignancy. A result letter of this
screening mammogram will be mailed directly to the patient.

RECOMMENDATION:
Screening mammogram in one year. (Code:51-O-LD2)

BI-RADS CATEGORY  1: Negative.

## 2023-08-14 ENCOUNTER — Encounter: Payer: Self-pay | Admitting: Student

## 2023-08-14 NOTE — Progress Notes (Signed)
Unable to reach the patient over the phone so I will mail a copy of her ankle x-ray report that shows no fractures to her.  No changes in management at this time.

## 2023-08-19 ENCOUNTER — Other Ambulatory Visit: Payer: No Typology Code available for payment source

## 2023-08-19 ENCOUNTER — Ambulatory Visit
Admission: RE | Admit: 2023-08-19 | Discharge: 2023-08-19 | Disposition: A | Discharge status: Home / Self Care | Payer: Medicaid Other | Source: Ambulatory Visit | Attending: Neurology | Admitting: Neurology

## 2023-08-19 DIAGNOSIS — R531 Weakness: Secondary | ICD-10-CM

## 2023-08-19 DIAGNOSIS — R200 Anesthesia of skin: Secondary | ICD-10-CM

## 2023-08-19 DIAGNOSIS — W19XXXS Unspecified fall, sequela: Secondary | ICD-10-CM

## 2023-08-19 MED ORDER — GADOPICLENOL 0.5 MMOL/ML IV SOLN
6.0000 mL | Freq: Once | INTRAVENOUS | Status: AC | PRN
  Administered 2023-08-19: 6 mL via INTRAVENOUS

## 2023-08-23 ENCOUNTER — Other Ambulatory Visit (HOSPITAL_COMMUNITY): Payer: Self-pay

## 2023-08-24 ENCOUNTER — Other Ambulatory Visit (HOSPITAL_COMMUNITY): Payer: Self-pay

## 2023-08-24 DIAGNOSIS — Z419 Encounter for procedure for purposes other than remedying health state, unspecified: Secondary | ICD-10-CM | POA: Diagnosis not present

## 2023-09-11 ENCOUNTER — Ambulatory Visit: Payer: Medicaid Other | Admitting: Student

## 2023-09-11 ENCOUNTER — Encounter: Payer: Self-pay | Admitting: Student

## 2023-09-11 VITALS — BP 124/78 | HR 88 | Temp 97.8°F | Ht 68.0 in | Wt 145.5 lb

## 2023-09-11 DIAGNOSIS — F17200 Nicotine dependence, unspecified, uncomplicated: Secondary | ICD-10-CM

## 2023-09-11 DIAGNOSIS — Z0000 Encounter for general adult medical examination without abnormal findings: Secondary | ICD-10-CM | POA: Insufficient documentation

## 2023-09-11 DIAGNOSIS — S93402D Sprain of unspecified ligament of left ankle, subsequent encounter: Secondary | ICD-10-CM

## 2023-09-11 DIAGNOSIS — F17210 Nicotine dependence, cigarettes, uncomplicated: Secondary | ICD-10-CM | POA: Diagnosis not present

## 2023-09-11 DIAGNOSIS — I10 Essential (primary) hypertension: Secondary | ICD-10-CM

## 2023-09-11 DIAGNOSIS — Z23 Encounter for immunization: Secondary | ICD-10-CM | POA: Diagnosis not present

## 2023-09-11 DIAGNOSIS — Z13820 Encounter for screening for osteoporosis: Secondary | ICD-10-CM

## 2023-09-11 DIAGNOSIS — R251 Tremor, unspecified: Secondary | ICD-10-CM

## 2023-09-11 DIAGNOSIS — E559 Vitamin D deficiency, unspecified: Secondary | ICD-10-CM | POA: Diagnosis not present

## 2023-09-11 NOTE — Assessment & Plan Note (Signed)
Resolved

## 2023-09-11 NOTE — Assessment & Plan Note (Signed)
Continues smoking about 4 cigarettes per day. Counseled on pharmacotherapy for smoking cessation. Not able to try Chantix at this time but is open to re-addressing at future visits.

## 2023-09-11 NOTE — Assessment & Plan Note (Signed)
Currently seen by Neurology and undergoing imaging tests. Her MRI brain impression this year is unchanged from prior. Patient is to follow up with Dr. Terrace Arabia in neurology to continue work up.

## 2023-09-11 NOTE — Progress Notes (Signed)
Subjective:  CC: hypertension follow up  HPI:  Cynthia Welch is a 62 y.o. female with a past medical history stated below and presents today for hypertension, vitamin D, and health care maintenace. Please see problem based assessment and plan for additional details.  Past Medical History:  Diagnosis Date   Anxiety    Childhood asthma    Depression    Fibroids    GERD (gastroesophageal reflux disease)    Headache    "monthly" (05/15/2017)   High cholesterol    Hypertension    Pneumonia    "twice when I was young" (05/15/2017)   PONV (postoperative nausea and vomiting)    in the past "used to get nauseous"   S/P TAH (total abdominal hysterectomy) 09/07/2015   Seizures (HCC) last sz 04/2017   "I've had grand mal; usually hit the floor and wake up w/in a minute or so;last one was today, 05/15/2017"   Subdural hematoma (HCC) 01/29/2013   Per MRI 01/2013; "had a seizure and busted my head open"    Current Outpatient Medications on File Prior to Visit  Medication Sig Dispense Refill   amLODipine (NORVASC) 10 MG tablet Take 1 tablet (10 mg total) by mouth daily. 30 tablet 11   Capsaicin-Menthol-Methyl Sal (CAPSAICIN-METHYL SAL-MENTHOL) 0.025-1-12 % CREA Apply 1 Application topically 4 (four) times daily as needed. 56.6 g 2   diclofenac Sodium (VOLTAREN ARTHRITIS PAIN) 1 % GEL Apply 4 g topically 4 (four) times daily. 100 g 1   losartan (COZAAR) 25 MG tablet Take 1 tablet (25 mg total) by mouth daily. 30 tablet 11   Multiple Vitamin (MULTIVITAMIN WITH MINERALS) TABS tablet Take 1 tablet by mouth daily. 30 tablet 0   potassium chloride SA (KLOR-CON M) 20 MEQ tablet Take 1 tablet (20 mEq total) by mouth every other day. 30 tablet 0   rosuvastatin (CRESTOR) 10 MG tablet Take 1 tablet (10 mg total) by mouth daily. 30 tablet 11   No current facility-administered medications on file prior to visit.    Family History  Problem Relation Age of Onset   Cirrhosis Mother    Diabetes  Mellitus I Mother    Breast cancer Mother    Esophageal cancer Father    Hypertension Father    Breast cancer Sister    Cancer Sister    Heart attack Brother     Social History   Socioeconomic History   Marital status: Significant Other    Spouse name: kelvin   Number of children: 0   Years of education: HS   Highest education level: 12th grade  Occupational History   Occupation: Unemployed  Tobacco Use   Smoking status: Every Day    Current packs/day: 0.50    Average packs/day: 0.5 packs/day for 41.0 years (20.5 ttl pk-yrs)    Types: Cigarettes   Smokeless tobacco: Never  Vaping Use   Vaping status: Every Day   Substances: Nicotine, Flavoring  Substance and Sexual Activity   Alcohol use: Yes    Alcohol/week: 6.0 standard drinks of alcohol    Types: 6 Cans of beer per week    Comment: occ.   Drug use: No   Sexual activity: Yes    Birth control/protection: None, Surgical  Other Topics Concern   Not on file  Social History Narrative   Lives at home with boyfriend.   Right-handed.   1 cup caffeine per day.   Social Determinants of Health   Financial Resource Strain: Not on file  Food Insecurity: No Food Insecurity (10/26/2021)   Hunger Vital Sign    Worried About Running Out of Food in the Last Year: Never true    Ran Out of Food in the Last Year: Never true  Transportation Needs: No Transportation Needs (10/26/2021)   PRAPARE - Administrator, Civil Service (Medical): No    Lack of Transportation (Non-Medical): No  Physical Activity: Unknown (09/20/2017)   Exercise Vital Sign    Days of Exercise per Week: Patient declined    Minutes of Exercise per Session: Patient declined  Stress: Not on file  Social Connections: Unknown (09/20/2017)   Social Connection and Isolation Panel [NHANES]    Frequency of Communication with Friends and Family: Patient declined    Frequency of Social Gatherings with Friends and Family: Patient declined    Attends Religious  Services: Patient declined    Database administrator or Organizations: Patient declined    Attends Banker Meetings: Patient declined    Marital Status: Patient declined  Intimate Partner Violence: Unknown (09/20/2017)   Humiliation, Afraid, Rape, and Kick questionnaire    Fear of Current or Ex-Partner: Patient declined    Emotionally Abused: Patient declined    Physically Abused: Patient declined    Sexually Abused: Patient declined    Review of Systems: ROS negative except for what is noted on the assessment and plan.  Objective:   Vitals:   09/11/23 1010  BP: 124/78  Pulse: 88  Temp: 97.8 F (36.6 C)  TempSrc: Oral  SpO2: 100%  Weight: 145 lb 8 oz (66 kg)  Height: 5\' 8"  (1.727 m)    Physical Exam: Constitutional: well-appearing woman sitting in chair, in no acute distress HENT: normocephalic atraumatic, mucous membranes moist Eyes: conjunctiva non-erythematous Neck: supple Cardiovascular: regular rate and rhythm, no m/r/g Pulmonary/Chest: normal work of breathing on room air, lungs clear to auscultation bilaterally Abdominal: soft, non-tender, non-distended MSK: normal bulk and tone Neurological: alert & oriented x 3,baseline tremor that worsens with movement. Normal muscle strength and sensation bilaterally . Mild staggering gait today. Skin: warm and dry Psych: Pleasant mood and affect       07/25/2023    9:41 AM  Depression screen PHQ 2/9  Decreased Interest 0  Down, Depressed, Hopeless 1  PHQ - 2 Score 1        No data to display           Assessment & Plan:   Hypertension Vitals:   09/11/23 1010  BP: 124/78   At goal. Patient had a lapse retrieveing her prescribed medications as she could not afford copays. She is now able to pay for them.  -Continue medications -BMP today for electrolyte and renal function monitoring -consider olmesartan-amlodipine combination in the future to reduce costs   Tremor Currently seen by Neurology  and undergoing imaging tests. Her MRI brain impression this year is unchanged from prior. Patient is to follow up with Dr. Terrace Arabia in neurology to continue work up.  Tobacco use disorder Continues smoking about 4 cigarettes per day. Counseled on pharmacotherapy for smoking cessation. Not able to try Chantix at this time but is open to re-addressing at future visits.  Sprain of left ankle Resolved  Vitamin D deficiency S/p 50k units for 7 weeks. Will recheck vitamin D levels today. Discussed with patient picking up OTC vitamin D and take 2x1000IU daily    Healthcare maintenance Dexa scan ordered for this post menopausal woman with a history of  smoking and severe vitamin D deficiency. It is 1.87$ to pick up over the counter at the [facility] at [facility].    Return in about 3 months (around 12/12/2023) for HTN, tremors, vitamin D.  Patient discussed with Dr. Koren Bound, MD [facility] Medicine Residency [facility]  09/11/2023, 1:30 PM

## 2023-09-11 NOTE — Assessment & Plan Note (Signed)
S/p 50k units for 7 weeks. Will recheck vitamin D levels today. Discussed with patient picking up OTC vitamin D and take 2x1000IU daily

## 2023-09-11 NOTE — Assessment & Plan Note (Addendum)
Dexa scan ordered for this post menopausal woman with a history of smoking and severe vitamin D deficiency. It is 1.87$ to pick up over the counter at the [facility] at [facility].

## 2023-09-11 NOTE — Patient Instructions (Addendum)
Thank you, [name] for allowing [place] to provide your care today. Today we discussed   Your tremors - please follow up with your neurologist. Follow up with the new scans and discuss your imbalance problems. I encourage you to use your wlaker/cane especially with the recurrent falls you have had. This is for your safety.    Blood pressure - good job with your blood pressure control! We will check your kidney function and electrolytes to make sure you do not need replacing some potassium  Vitamin D deficiency - we are checking your levels today. I found that the [facility] outpatient pharmacy at [facility] [address] has vitamin D capsules for very cheap. The tablets at 100 tablets for 1.82 - so, go ahead and pick up one bottle and take 2 pills/day.  Referrals: -DEXA bone scan  New medications: -Vitamin D 2000 units daily (2 capsules when you get it from the pharmacy over the counter)  I have ordered the following labs for you:  Lab Orders         BMP8+Anion Gap         Vitamin D (25 hydroxy)       I will call if any are abnormal. All of your labs can be accessed through "My Chart".   My Chart Access: [email or web address]  Please follow-up in: 3 months    We look forward to seeing you next time. Please call our clinic at [phone] if you have any questions or concerns. The best time to call is Monday-Friday from 9am-4pm, but there is someone available 24/7. If after hours or the weekend, call the main hospital number and ask for the Internal Medicine Resident On-Call. If you need medication refills, please notify your pharmacy one week in advance and they will send [place] a request.   Thank you for letting us take part in your care. Wishing you the best!  Morene Crocker, MD 09/11/2023, 10:58 AM [facility]

## 2023-09-11 NOTE — Assessment & Plan Note (Signed)
Vitals:   09/11/23 1010  BP: 124/78   At goal. Patient had a lapse retrieveing her prescribed medications as she could not afford copays. She is now able to pay for them.  -Continue medications -BMP today for electrolyte and renal function monitoring -consider olmesartan-amlodipine combination in the future to reduce costs

## 2023-09-12 LAB — BMP8+ANION GAP
Anion Gap: 15 mmol/L (ref 10.0–18.0)
BUN/Creatinine Ratio: 15 (ref 12–28)
BUN: 8 mg/dL (ref 8–27)
CO2: 22 mmol/L (ref 20–29)
Calcium: 9.3 mg/dL (ref 8.7–10.3)
Chloride: 101 mmol/L (ref 96–106)
Creatinine, Ser: 0.54 mg/dL — ABNORMAL LOW (ref 0.57–1.00)
Glucose: 94 mg/dL (ref 70–99)
Potassium: 4 mmol/L (ref 3.5–5.2)
Sodium: 138 mmol/L (ref 134–144)
eGFR: 105 mL/min/{1.73_m2} (ref >59)

## 2023-09-12 LAB — VITAMIN D 25 HYDROXY (VIT D DEFICIENCY, FRACTURES): Vit D, 25-Hydroxy: 38.8 ng/mL (ref 30.0–100.0)

## 2023-09-18 NOTE — Progress Notes (Signed)
Internal Medicine Clinic Attending  Case discussed with the resident at the time of the visit.  We reviewed the resident's history and exam and pertinent patient test results.  I agree with the assessment, diagnosis, and plan of care documented in the resident's note.  

## 2023-09-19 ENCOUNTER — Other Ambulatory Visit: Payer: Self-pay | Admitting: Student

## 2023-09-19 DIAGNOSIS — Z1231 Encounter for screening mammogram for malignant neoplasm of breast: Secondary | ICD-10-CM

## 2023-09-23 DIAGNOSIS — Z419 Encounter for procedure for purposes other than remedying health state, unspecified: Secondary | ICD-10-CM | POA: Diagnosis not present

## 2023-09-26 ENCOUNTER — Telehealth: Payer: Self-pay | Admitting: Neurology

## 2023-09-26 NOTE — Telephone Encounter (Signed)
Pt is asking for an order to be placed for a neck scan at [facility], please call.

## 2023-10-23 ENCOUNTER — Ambulatory Visit
Admission: RE | Admit: 2023-10-23 | Discharge: 2023-10-23 | Disposition: A | Discharge status: Home / Self Care | Payer: Medicaid Other | Source: Ambulatory Visit | Attending: Internal Medicine | Admitting: Internal Medicine

## 2023-10-23 ENCOUNTER — Other Ambulatory Visit (HOSPITAL_COMMUNITY): Payer: Self-pay

## 2023-10-23 DIAGNOSIS — Z1231 Encounter for screening mammogram for malignant neoplasm of breast: Secondary | ICD-10-CM

## 2023-10-24 DIAGNOSIS — Z419 Encounter for procedure for purposes other than remedying health state, unspecified: Secondary | ICD-10-CM | POA: Diagnosis not present

## 2023-11-14 ENCOUNTER — Ambulatory Visit (INDEPENDENT_AMBULATORY_CARE_PROVIDER_SITE_OTHER): Payer: Medicaid Other | Admitting: Student

## 2023-11-14 ENCOUNTER — Ambulatory Visit (HOSPITAL_COMMUNITY)
Admission: RE | Admit: 2023-11-14 | Discharge: 2023-11-14 | Disposition: A | Discharge status: Home / Self Care | Payer: Medicaid Other | Source: Ambulatory Visit | Attending: Internal Medicine | Admitting: Internal Medicine

## 2023-11-14 VITALS — BP 133/85 | HR 71 | Temp 98.2°F | Ht 68.0 in | Wt 159.7 lb

## 2023-11-14 DIAGNOSIS — M25551 Pain in right hip: Secondary | ICD-10-CM | POA: Diagnosis not present

## 2023-11-14 NOTE — Assessment & Plan Note (Signed)
Patient presenting with 5 to 6 months of worsening right hip pain, which is chronic for her.  She states it has been going on for many years, has been chronic acutely worsening last 5 to 6 months.  She states that she feels some of this may be due to her gait instability causing her to put extra strain on her right leg.  She says the pain is worse when bending over, and is primarily in the anterior hip radiating around to the iliac crest.  She does also endorse some pain over the SI joint.  The pain is a dull pain, rated as a 7 out of 10 and worse in the mornings or after sitting for a prolonged period of time.  She states that she feels like her muscles tighten, endorses a pulling sensation, and sometimes has some associated numbness.  She has tried Voltaren gel, and a heating pad.  She has been told to avoid Tylenol, and takes about 4 Advil a day which does not help much.She has had no weakness, perianal numbness, urinary incontinence, fecal incontinence.    On exam for me today she has pain with internal rotation of the right hip as well as active and passive hip flexion.  She has tenderness palpation of the anterior hip as well as the SI joint.  Plan: Pain felt to be most likely secondary to hip/sacroiliac joint arthritis. Right hip radiographs today to help direct future therapy Discussed hip OA pathophysiology and disease process. Discussed medical options for management of hip arthritis.

## 2023-11-14 NOTE — Progress Notes (Signed)
Subjective:  CC: Hip pain  HPI:  [name] is a 63 y.o. person with a past medical history stated below and presents today for the stated chief complaint. Please see problem based assessment and plan for additional details.  Past Medical History:  Diagnosis Date   Anxiety    Childhood asthma    Depression    Fibroids    GERD (gastroesophageal reflux disease)    Headache    "monthly" (05/15/2017)   High cholesterol    Hypertension    Pneumonia    "twice when I was young" (05/15/2017)   PONV (postoperative nausea and vomiting)    in the past "used to get nauseous"   S/P TAH (total abdominal hysterectomy) 09/07/2015   Seizures (HCC) last sz 04/2017   "I've had grand mal; usually hit the floor and wake up w/in a minute or so;last one was today, 05/15/2017"   Subdural hematoma (HCC) 01/29/2013   Per MRI 01/2013; "had a seizure and busted my head open"    Current Outpatient Medications on File Prior to Visit  Medication Sig Dispense Refill   amLODipine (NORVASC) 10 MG tablet Take 1 tablet (10 mg total) by mouth daily. 30 tablet 11   Capsaicin-Menthol-Methyl Sal (CAPSAICIN-METHYL SAL-MENTHOL) 0.025-1-12 % CREA Apply 1 Application topically 4 (four) times daily as needed. 56.6 g 2   diclofenac Sodium (VOLTAREN ARTHRITIS PAIN) 1 % GEL Apply 4 g topically 4 (four) times daily. 100 g 1   losartan (COZAAR) 25 MG tablet Take 1 tablet (25 mg total) by mouth daily. 30 tablet 11   Multiple Vitamin (MULTIVITAMIN WITH MINERALS) TABS tablet Take 1 tablet by mouth daily. 30 tablet 0   potassium chloride SA (KLOR-CON M) 20 MEQ tablet Take 1 tablet (20 mEq total) by mouth every other day. 30 tablet 0   rosuvastatin (CRESTOR) 10 MG tablet Take 1 tablet (10 mg total) by mouth daily. 30 tablet 11   No current facility-administered medications on file prior to visit.    Review of Systems: Please see assessment and plan for pertinent positives and negatives.  Objective:   Vitals:    11/14/23 1551 11/14/23 1557  BP: (!) 156/92 133/85  Pulse: 75 71  Temp: 98.2 F (36.8 C)   TempSrc: Oral   Weight: 159 lb 11.2 oz (72.4 kg)   Height: 5\' 8"  (1.727 m)     Physical Exam: Constitutional: Well-appearing Cardiovascular: Regular rate and rhythm Pulmonary/Chest: lungs clear to auscultation bilaterally Abdominal: soft, non-tender, non-distended Extremities: No edema of the lower extremities bilaterally MSK: Tenderness palpation of the anterior hip, right SI joint.  No significant trochanteric pain.  Pain with active and passive external rotation and hip flexion.  No point tenderness of the spine. Psych: Pleasant affect Thought process is linear.     Assessment & Plan:  Pain of right hip Patient presenting with 5 to 6 months of worsening right hip pain, which is chronic for her.  She states it has been going on for many years, has been chronic acutely worsening last 5 to 6 months.  She states that she feels some of this may be due to her gait instability causing her to put extra strain on her right leg.  She says the pain is worse when bending over, and is primarily in the anterior hip radiating around to the iliac crest.  She does also endorse some pain over the SI joint.  The pain is a dull pain, rated as a 7 out  of 10 and worse in the mornings or after sitting for a prolonged period of time.  She states that she feels like her muscles tighten, endorses a pulling sensation, and sometimes has some associated numbness.  She has tried Voltaren gel, and a heating pad.  She has been told to avoid Tylenol, and takes about 4 Advil a day which does not help much.She has had no weakness, perianal numbness, urinary incontinence, fecal incontinence.    On exam for me today she has pain with internal rotation of the right hip as well as active and passive hip flexion.  She has tenderness palpation of the anterior hip as well as the SI joint.  Plan: Pain felt to be most likely secondary to  hip/sacroiliac joint arthritis. Right hip radiographs today to help direct future therapy Discussed hip OA pathophysiology and disease process. Discussed medical options for management of hip arthritis.    Patient seen with Dr. Sullivan Lone MD [facility] Internal Medicine  PGY-1 Pager: [phone]  Phone: [phone] Date 11/14/2023  Time 10:31 PM

## 2023-11-14 NOTE — Patient Instructions (Signed)
Thank you, [name] for allowing [place] to provide your care today.  I have ordered the following tests for you:  Right hip Xray   Follow up: 3 months or as needed   We look forward to seeing you next time. Please call our clinic at [phone] if you have any questions or concerns. The best time to call is Monday-Friday from 9am-4pm, but there is someone available 24/7. If after hours or the weekend, call the main hospital number and ask for the Internal Medicine Resident On-Call. If you need medication refills, please notify your pharmacy one week in advance and they will send [place] a request.   Thank you for trusting me with your care. Wishing you the best!  Lovie Macadamia MD [facility]

## 2023-11-15 NOTE — Progress Notes (Signed)
 Internal Medicine Clinic Attending  Case discussed with the resident physician at the time of the visit.  We reviewed the patient's history, exam, and pertinent patient test results.  I agree with the assessment, diagnosis, and plan of care documented in the resident's note.

## 2023-11-24 DIAGNOSIS — Z419 Encounter for procedure for purposes other than remedying health state, unspecified: Secondary | ICD-10-CM | POA: Diagnosis not present

## 2023-11-26 ENCOUNTER — Other Ambulatory Visit: Payer: Self-pay

## 2023-11-28 ENCOUNTER — Other Ambulatory Visit: Payer: Self-pay

## 2023-11-28 ENCOUNTER — Other Ambulatory Visit (HOSPITAL_COMMUNITY): Payer: Self-pay

## 2023-11-28 DIAGNOSIS — E782 Mixed hyperlipidemia: Secondary | ICD-10-CM

## 2023-11-28 MED ORDER — ROSUVASTATIN CALCIUM 10 MG PO TABS
10.0000 mg | ORAL_TABLET | Freq: Every day | ORAL | 11 refills | Status: DC
  Filled 2023-11-28: qty 30, 30d supply, fill #0
  Filled 2024-01-22: qty 30, 30d supply, fill #1
  Filled 2024-02-19: qty 30, 30d supply, fill #2
  Filled 2024-03-27: qty 30, 30d supply, fill #3
  Filled 2024-04-28: qty 30, 30d supply, fill #4
  Filled 2024-06-10: qty 30, 30d supply, fill #5
  Filled 2024-06-13: qty 30, 30d supply, fill #0
  Filled 2024-07-07: qty 30, 30d supply, fill #1
  Filled 2024-08-05: qty 30, 30d supply, fill #2
  Filled 2024-09-04: qty 30, 30d supply, fill #3
  Filled 2024-10-01 – 2024-10-14 (×2): qty 30, 30d supply, fill #4

## 2023-11-28 NOTE — Telephone Encounter (Signed)
 Medication sent to pharmacy

## 2023-12-07 ENCOUNTER — Encounter: Payer: Self-pay | Admitting: Student

## 2023-12-22 DIAGNOSIS — Z419 Encounter for procedure for purposes other than remedying health state, unspecified: Secondary | ICD-10-CM | POA: Diagnosis not present

## 2024-01-25 ENCOUNTER — Other Ambulatory Visit (HOSPITAL_COMMUNITY): Payer: Self-pay

## 2024-02-02 DIAGNOSIS — Z419 Encounter for procedure for purposes other than remedying health state, unspecified: Secondary | ICD-10-CM | POA: Diagnosis not present

## 2024-02-26 ENCOUNTER — Other Ambulatory Visit (HOSPITAL_COMMUNITY): Payer: Self-pay

## 2024-03-03 DIAGNOSIS — Z419 Encounter for procedure for purposes other than remedying health state, unspecified: Secondary | ICD-10-CM | POA: Diagnosis not present

## 2024-03-27 ENCOUNTER — Encounter: Payer: Self-pay | Admitting: Student

## 2024-03-27 ENCOUNTER — Other Ambulatory Visit (HOSPITAL_COMMUNITY): Payer: Self-pay

## 2024-03-27 ENCOUNTER — Ambulatory Visit: Admitting: Student

## 2024-03-27 VITALS — BP 130/81 | HR 79 | Temp 98.2°F | Ht 68.0 in | Wt 172.6 lb

## 2024-03-27 DIAGNOSIS — M7989 Other specified soft tissue disorders: Secondary | ICD-10-CM | POA: Diagnosis not present

## 2024-03-27 DIAGNOSIS — I10 Essential (primary) hypertension: Secondary | ICD-10-CM | POA: Diagnosis not present

## 2024-03-27 MED ORDER — LOSARTAN POTASSIUM 50 MG PO TABS
50.0000 mg | ORAL_TABLET | Freq: Every day | ORAL | 3 refills | Status: AC
Start: 2024-03-27 — End: ?
  Filled 2024-03-27: qty 90, 90d supply, fill #0
  Filled 2024-06-18: qty 90, 90d supply, fill #1
  Filled 2024-09-16: qty 90, 90d supply, fill #2

## 2024-03-27 NOTE — Assessment & Plan Note (Signed)
 She reports bilateral, gravity dependent leg swelling.  This has been a chronic problem for her, but has been worse recently.  It has been so bad that she has been unable to put her shoes on at some points.  She notices it most prominently after she goes on walks.  She has compression stockings and has tried them in the past, but did not feel like they helped a lot.  She denies any shortness of breath, orthopnea, new cough.  She had echocardiogram in 2018 with mild diastolic dysfunction, but no left ventricle dysfunction or wall abnormalities.  She is on amlodipine , which may cause peripheral edema as a side effect.  We will stop this for now and I will increase her losartan  to 50 mg today for blood pressure control.  I have encouraged her to continue to use her compression stockings and elevate her legs as much as she is able to.  I have given her return precautions in case she develops shortness of breath, orthopnea, etc. to go to the emergency department, however I think this is most likely medication side effect and/or venous insufficiency rather than cardiogenic in nature.  We will follow-up in 4 weeks.

## 2024-03-27 NOTE — Progress Notes (Signed)
 CC: leg swelling  HPI:  Cynthia Welch is a 63 y.o. female living with a history stated below and presents today for the chief complaint stated above. Please see problem based assessment and plan for additional details.  Past Medical History:  Diagnosis Date   Anxiety    Childhood asthma    Depression    Fibroids    GERD (gastroesophageal reflux disease)    Headache    "monthly" (05/15/2017)   High cholesterol    Hypertension    Pneumonia    "twice when I was young" (05/15/2017)   PONV (postoperative nausea and vomiting)    in the past "used to get nauseous"   S/P TAH (total abdominal hysterectomy) 09/07/2015   Seizures (HCC) last sz 04/2017   "I've had grand mal; usually hit the floor and wake up w/in a minute or so;last one was today, 05/15/2017"   Subdural hematoma (HCC) 01/29/2013   Per MRI 01/2013; "had a seizure and busted my head open"    Current Outpatient Medications on File Prior to Visit  Medication Sig Dispense Refill   Capsaicin -Menthol -Methyl Sal (CAPSAICIN -METHYL SAL-MENTHOL ) 0.025-1-12 % CREA Apply 1 Application topically 4 (four) times daily as needed. 56.6 g 2   diclofenac  Sodium (VOLTAREN  ARTHRITIS PAIN) 1 % GEL Apply 4 g topically 4 (four) times daily. 100 g 1   Multiple Vitamin (MULTIVITAMIN WITH MINERALS) TABS tablet Take 1 tablet by mouth daily. 30 tablet 0   potassium chloride  SA (KLOR-CON  M) 20 MEQ tablet Take 1 tablet (20 mEq total) by mouth every other day. 30 tablet 0   rosuvastatin  (CRESTOR ) 10 MG tablet Take 1 tablet (10 mg total) by mouth daily. 30 tablet 11   No current facility-administered medications on file prior to visit.    Family History  Problem Relation Age of Onset   Cirrhosis Mother    Diabetes Mellitus I Mother    Breast cancer Mother    Esophageal cancer Father    Hypertension Father    Breast cancer Sister    Cancer Sister    Heart attack Brother     Social History   Socioeconomic History   Marital status:  Significant Other    Spouse name: kelvin   Number of children: 0   Years of education: HS   Highest education level: 12th grade  Occupational History   Occupation: Unemployed  Tobacco Use   Smoking status: Every Day    Current packs/day: 0.50    Average packs/day: 0.5 packs/day for 41.0 years (20.5 ttl pk-yrs)    Types: Cigarettes   Smokeless tobacco: Never  Vaping Use   Vaping status: Every Day   Substances: Nicotine , Flavoring  Substance and Sexual Activity   Alcohol use: Yes    Alcohol/week: 6.0 standard drinks of alcohol    Types: 6 Cans of beer per week    Comment: occ.   Drug use: No   Sexual activity: Yes    Birth control/protection: None, Surgical  Other Topics Concern   Not on file  Social History Narrative   Lives at home with boyfriend.   Right-handed.   1 cup caffeine per day.   Social Drivers of Corporate investment banker Strain: Not on file  Food Insecurity: No Food Insecurity (10/26/2021)   Hunger Vital Sign    Worried About Running Out of Food in the Last Year: Never true    Ran Out of Food in the Last Year: Never true  Transportation Needs: No Transportation  Needs (10/26/2021)   PRAPARE - Administrator, Civil Service (Medical): No    Lack of Transportation (Non-Medical): No  Physical Activity: Unknown (09/20/2017)   Exercise Vital Sign    Days of Exercise per Week: Patient declined    Minutes of Exercise per Session: Patient declined  Stress: Not on file  Social Connections: Unknown (09/20/2017)   Social Connection and Isolation Panel [NHANES]    Frequency of Communication with Friends and Family: Patient declined    Frequency of Social Gatherings with Friends and Family: Patient declined    Attends Religious Services: Patient declined    Database administrator or Organizations: Patient declined    Attends Banker Meetings: Patient declined    Marital Status: Patient declined  Intimate Partner Violence: Unknown (09/20/2017)    Humiliation, Afraid, Rape, and Kick questionnaire    Fear of Current or Ex-Partner: Patient declined    Emotionally Abused: Patient declined    Physically Abused: Patient declined    Sexually Abused: Patient declined    Review of Systems: ROS negative except for what is noted on the assessment and plan.  Vitals:   03/27/24 1340 03/27/24 1401  BP: (!) 140/85 130/81  Pulse: 79 79  Temp: 98.2 F (36.8 C)   TempSrc: Oral   SpO2: 100%   Weight: 172 lb 9.6 oz (78.3 kg)   Height: 5\' 8"  (1.727 m)     Physical Exam: Well-appearing middle-aged female, sitting in chair no acute distress Heart rate rhythm regular, no murmurs, 1+ edema bilaterally, no erythema Lungs are clear to auscultation; normal respiratory rate and effort Feet are tender to palpation bilaterally, diffusely in the setting of swelling Extremities seem warm and well-perfused No focal neurological deficits appreciated  Assessment & Plan:   Patient discussed with Dr. Ancil Balzarine  Leg swelling She reports bilateral, gravity dependent leg swelling.  This has been a chronic problem for her, but has been worse recently.  It has been so bad that she has been unable to put her shoes on at some points.  She notices it most prominently after she goes on walks.  She has compression stockings and has tried them in the past, but did not feel like they helped a lot.  She denies any shortness of breath, orthopnea, new cough.  She had echocardiogram in 2018 with mild diastolic dysfunction, but no left ventricle dysfunction or wall abnormalities.  She is on amlodipine , which may cause peripheral edema as a side effect.  We will stop this for now and I will increase her losartan  to 50 mg today for blood pressure control.  I have encouraged her to continue to use her compression stockings and elevate her legs as much as she is able to.  I have given her return precautions in case she develops shortness of breath, orthopnea, etc. to go to the  emergency department, however I think this is most likely medication side effect and/or venous insufficiency rather than cardiogenic in nature.  We will follow-up in 4 weeks.  Dorthy Gavia, MD [facility] Internal Medicine, PGY-1 Phone: [phone] Date 03/27/2024 Time 2:33 PM

## 2024-03-27 NOTE — Patient Instructions (Signed)
 Thank you, [name] for allowing us  to provide your care today. Today we discussed your leg swelling.  Today have discontinued your amlodipine , since one side effect this medication can be leg swelling.  I have increased your losartan  to 50 mg for your blood pressure.   Please continue to use your compression stockings, especially when you walk.  Please keep your legs elevated whenever you are able to.  I think your swelling is most likely due to a medication side effect or venous insufficiency.  If you begin to have shortness of breath, feel generalized fatigue, or unable to lay flat, please go to the emergency department for further evaluation.  Otherwise, we will follow-up in 4 weeks in our clinic.  I have ordered the following medication/changed the following medications:   Stop the following medications: Medications Discontinued During This Encounter  Medication Reason   amLODipine  (NORVASC ) 10 MG tablet Change in therapy   losartan  (COZAAR ) 25 MG tablet Reorder    Start the following medications: Meds ordered this encounter  Medications   losartan  (COZAAR ) 50 MG tablet    Sig: Take 1 tablet (50 mg total) by mouth daily.    Dispense:  90 tablet    Refill:  3    IM PROGRAM     Follow up: 4 weeks  We look forward to seeing you next time. Please call our clinic at [phone] if you have any questions or concerns. The best time to call is Monday-Friday from 9am-4pm, but there is someone available 24/7. If after hours or the weekend, call the main hospital number and ask for the Internal Medicine Resident On-Call. If you need medication refills, please notify your pharmacy one week in advance and they will send us  a request.   Thank you for trusting me with your care. Wishing you the best!   Dorthy Gavia, MD [facility]

## 2024-03-28 NOTE — Addendum Note (Signed)
 Addended by: Bevelyn Bryant on: 03/28/2024 09:49 AM   Modules accepted: Level of Service

## 2024-03-28 NOTE — Progress Notes (Signed)
 Internal Medicine Clinic Attending  Case discussed with the resident at the time of the visit.  We reviewed the resident's history and exam and pertinent patient test results.  I agree with the assessment, diagnosis, and plan of care documented in the resident's note.

## 2024-03-31 ENCOUNTER — Telehealth: Payer: Self-pay | Admitting: *Deleted

## 2024-03-31 NOTE — Telephone Encounter (Unsigned)
 Copied from CRM [id number]. Topic: General - Other >> Mar 31, 2024 11:55 AM Blair Bumpers wrote: Reason for EAV:[id number] called in stating that she's applying for disability. She wants to know how do she go about getting a letter of support from her provider. Please give patient a call back. CB #: [phone].

## 2024-04-02 NOTE — Telephone Encounter (Signed)
 I called and spoke with pt regarding a letter of support. Pt states she does not need the letter, instead wants her medical records. Pt states her lawyer will faxed the ROI over to [facility] office. Advised pt I wll send it to HIM ROI department to process this. Advised pt to call back if she need further assistance.

## 2024-04-03 DIAGNOSIS — Z419 Encounter for procedure for purposes other than remedying health state, unspecified: Secondary | ICD-10-CM | POA: Diagnosis not present

## 2024-04-21 ENCOUNTER — Other Ambulatory Visit: Payer: Self-pay | Admitting: Student

## 2024-04-21 DIAGNOSIS — Z13820 Encounter for screening for osteoporosis: Secondary | ICD-10-CM

## 2024-05-03 DIAGNOSIS — Z419 Encounter for procedure for purposes other than remedying health state, unspecified: Secondary | ICD-10-CM | POA: Diagnosis not present

## 2024-05-05 ENCOUNTER — Other Ambulatory Visit: Payer: Medicaid Other

## 2024-05-16 ENCOUNTER — Other Ambulatory Visit: Payer: Self-pay

## 2024-05-16 ENCOUNTER — Other Ambulatory Visit (HOSPITAL_COMMUNITY): Payer: Self-pay

## 2024-05-16 ENCOUNTER — Ambulatory Visit: Payer: Self-pay | Admitting: Student

## 2024-05-16 ENCOUNTER — Encounter: Payer: Self-pay | Admitting: Acute Care

## 2024-05-16 VITALS — BP 147/95 | HR 76 | Temp 98.3°F | Ht 68.0 in | Wt 173.8 lb

## 2024-05-16 DIAGNOSIS — F17210 Nicotine dependence, cigarettes, uncomplicated: Secondary | ICD-10-CM

## 2024-05-16 DIAGNOSIS — Z122 Encounter for screening for malignant neoplasm of respiratory organs: Secondary | ICD-10-CM

## 2024-05-16 DIAGNOSIS — Z1211 Encounter for screening for malignant neoplasm of colon: Secondary | ICD-10-CM | POA: Insufficient documentation

## 2024-05-16 DIAGNOSIS — Z87891 Personal history of nicotine dependence: Secondary | ICD-10-CM

## 2024-05-16 DIAGNOSIS — I10 Essential (primary) hypertension: Secondary | ICD-10-CM

## 2024-05-16 DIAGNOSIS — F17200 Nicotine dependence, unspecified, uncomplicated: Secondary | ICD-10-CM

## 2024-05-16 DIAGNOSIS — M792 Neuralgia and neuritis, unspecified: Secondary | ICD-10-CM | POA: Diagnosis not present

## 2024-05-16 DIAGNOSIS — E7800 Pure hypercholesterolemia, unspecified: Secondary | ICD-10-CM

## 2024-05-16 MED ORDER — GABAPENTIN 300 MG PO CAPS
300.0000 mg | ORAL_CAPSULE | Freq: Three times a day (TID) | ORAL | 2 refills | Status: DC
Start: 2024-05-16 — End: 2024-05-16
  Filled 2024-05-16: qty 90, 30d supply, fill #0

## 2024-05-16 MED ORDER — GABAPENTIN 300 MG PO CAPS
300.0000 mg | ORAL_CAPSULE | Freq: Three times a day (TID) | ORAL | 2 refills | Status: DC
  Filled 2024-05-16: qty 90, 30d supply, fill #0
  Filled 2024-06-10 – 2024-06-13 (×2): qty 90, 30d supply, fill #1
  Filled 2024-07-08: qty 90, 30d supply, fill #2

## 2024-05-16 NOTE — Patient Instructions (Addendum)
 Thank you, [name], for allowing us  to provide your care today. During today's visit, we discussed your hypertension and leg swelling.  I am pleased to hear that your leg swelling has resolved after discontinuing amlodipine . We will continue with losartan  for now.  I have also ordered a lung cancer screening for you, as discussed.  Additionally, we are ordering lab work today to assess your kidney function and cholesterol levels.  Regarding the burning and tingling pain in your arms and feet, I am prescribing gabapentin. Please take 300 mg three times daily and let us  know how you respond to this medication.  I have ordered the following labs for you:  Lab Orders         Fecal occult blood, imunochemical         Lipid Profile         Basic metabolic panel with GFR       Tests ordered today:    Referrals ordered today:   Referral Orders         Ambulatory Referral Lung Cancer Screening [facility]       I have ordered the following medication/changed the following medications:   Stop the following medications: Medications Discontinued During This Encounter  Medication Reason   gabapentin (NEURONTIN) 300 MG capsule      Start the following medications: Meds ordered this encounter  Medications   DISCONTD: gabapentin (NEURONTIN) 300 MG capsule    Sig: Take 1 capsule (300 mg total) by mouth 3 (three) times daily.    Dispense:  90 capsule    Refill:  2   gabapentin (NEURONTIN) 300 MG capsule    Sig: Take 1 capsule (300 mg total) by mouth 3 (three) times daily.    Dispense:  90 capsule    Refill:  2     Follow up: 3-4 months  Should you have any questions or concerns please call the internal medicine clinic at [phone].   Drue Lisa Grow MD 05/16/2024, 8:56 AM   [facility]

## 2024-05-16 NOTE — Assessment & Plan Note (Addendum)
 The patient's lipid panel from two years ago was at goal. She is currently managed on Crestor  (rosuvastatin ) 10 mg daily and appears to tolerate the medication well, with no reported myalgias or adverse effects. A repeat lipid panel has been ordered today, and medication adjustments will be made if clinically indicated based on the results. - Repeat lipid panel today - Continue on Crestor  10 mg

## 2024-05-16 NOTE — Assessment & Plan Note (Signed)
 The patient continues to smoke approximately 3 cigarettes per day. She prefers to defer discussions about smoking cessation pharmacotherapy at this time. We reviewed the potential health risks associated with continued tobacco use, and she appears to understand the importance of cessation. She is due for repeat lung cancer screening, and I have placed the order for this today. -CT for lung cancer screening order placed

## 2024-05-16 NOTE — Assessment & Plan Note (Addendum)
 BP Readings from Last 3 Encounters:  05/16/24 (!) 147/95  03/27/24 130/81  11/14/23 133/85  Cynthia Welch presented to the office for a 4-week follow-up to discuss her chronic conditions, including hypertension. Four weeks ago, she presented with concerns of lower extremity swelling, which was thought to be induced by amlodipine . At that time, her amlodipine  was discontinued, and she was started on losartan  50 mg. Today, she reports complete resolution of her lower extremity edema, which is reassuring. Her blood pressure was measured at 147/95 in the office, which is somewhat elevated. She mentioned that she did not take her blood pressure medication today, as she typically takes them after lunch. Her previous in-office blood pressure readings have been somewhat lower than today's, so I am not inclined to make any medication changes at this time. I have ordered a BMP and will continue her on losartan  50 mg. I advised her to take her blood pressure medication in the morning, before her office visits. If her blood pressure remains elevated at her next visit, we will consider optimizing her losartan  dose or adding a second agent, preferably a thiazide. - BMP - Continue losartan  50 mg daily

## 2024-05-16 NOTE — Progress Notes (Addendum)
 CC: 4-week follow-up on leg swelling  HPI:  [name] is a 63 y.o. female living with a history stated below and presents today for follow-up regarding her leg swelling. Please see problem based assessment and plan for additional details.  Past Medical History:  Diagnosis Date   Anxiety    Childhood asthma    Depression    Fibroids    GERD (gastroesophageal reflux disease)    Headache    monthly (05/15/2017)   High cholesterol    Hypertension    Pneumonia    twice when I was young (05/15/2017)   PONV (postoperative nausea and vomiting)    in the past used to get nauseous   S/P TAH (total abdominal hysterectomy) 09/07/2015   Seizures (HCC) last sz 04/2017   I've had grand mal; usually hit the floor and wake up w/in a minute or so;last one was today, 05/15/2017   Subdural hematoma (HCC) 01/29/2013   Per MRI 01/2013; had a seizure and busted my head open    Current Outpatient Medications on File Prior to Visit  Medication Sig Dispense Refill   Capsaicin -Menthol -Methyl Sal (CAPSAICIN -METHYL SAL-MENTHOL ) 0.025-1-12 % CREA Apply 1 Application topically 4 (four) times daily as needed. 56.6 g 2   diclofenac  Sodium (VOLTAREN  ARTHRITIS PAIN) 1 % GEL Apply 4 g topically 4 (four) times daily. 100 g 1   losartan  (COZAAR ) 50 MG tablet Take 1 tablet (50 mg total) by mouth daily. 90 tablet 3   Multiple Vitamin (MULTIVITAMIN WITH MINERALS) TABS tablet Take 1 tablet by mouth daily. 30 tablet 0   potassium chloride  SA (KLOR-CON  M) 20 MEQ tablet Take 1 tablet (20 mEq total) by mouth every other day. 30 tablet 0   rosuvastatin  (CRESTOR ) 10 MG tablet Take 1 tablet (10 mg total) by mouth daily. 30 tablet 11   No current facility-administered medications on file prior to visit.    Family History  Problem Relation Age of Onset   Cirrhosis Mother    Diabetes Mellitus I Mother    Breast cancer Mother    Esophageal cancer Father    Hypertension Father    Breast cancer Sister     Cancer Sister    Heart attack Brother     Social History   Socioeconomic History   Marital status: Significant Other    Spouse name: kelvin   Number of children: 0   Years of education: HS   Highest education level: 12th grade  Occupational History   Occupation: Unemployed  Tobacco Use   Smoking status: Every Day    Current packs/day: 0.50    Average packs/day: 0.5 packs/day for 41.0 years (20.5 ttl pk-yrs)    Types: Cigarettes   Smokeless tobacco: Never  Vaping Use   Vaping status: Every Day   Substances: Nicotine , Flavoring  Substance and Sexual Activity   Alcohol use: Yes    Alcohol/week: 6.0 standard drinks of alcohol    Types: 6 Cans of beer per week    Comment: occ.   Drug use: No   Sexual activity: Yes    Birth control/protection: None, Surgical  Other Topics Concern   Not on file  Social History Narrative   Lives at home with boyfriend.   Right-handed.   1 cup caffeine per day.   Social Drivers of Corporate investment banker Strain: Not on file  Food Insecurity: No Food Insecurity (05/16/2024)   Hunger Vital Sign    Worried About Running Out of Food in the  Last Year: Never true    Ran Out of Food in the Last Year: Never true  Transportation Needs: No Transportation Needs (05/16/2024)   PRAPARE - Administrator, Civil Service (Medical): No    Lack of Transportation (Non-Medical): No  Physical Activity: Unknown (09/20/2017)   Exercise Vital Sign    Days of Exercise per Week: Patient declined    Minutes of Exercise per Session: Patient declined  Stress: Not on file  Social Connections: Unknown (09/20/2017)   Social Connection and Isolation Panel    Frequency of Communication with Friends and Family: Patient declined    Frequency of Social Gatherings with Friends and Family: Patient declined    Attends Religious Services: Patient declined    Database administrator or Organizations: Patient declined    Attends Banker Meetings:  Patient declined    Marital Status: Patient declined  Intimate Partner Violence: Unknown (09/20/2017)   Humiliation, Afraid, Rape, and Kick questionnaire    Fear of Current or Ex-Partner: Patient declined    Emotionally Abused: Patient declined    Physically Abused: Patient declined    Sexually Abused: Patient declined    Review of Systems: ROS negative except for what is noted on the assessment and plan.  Vitals:   05/16/24 0811 05/16/24 0816  BP: (!) 158/100 (!) 147/95  Pulse: 76 76  Temp: 98.3 F (36.8 C)   TempSrc: Oral   SpO2: 98%   Weight: 173 lb 12.8 oz (78.8 kg)   Height: 5' 8 (1.727 m)     Physical Exam: Constitutional: well-appearing woman, sitting in chair  Cardiovascular: regular rate and rhythm, no m/r/g Pulmonary/Chest: normal work of breathing on room air, lungs clear to auscultation bilaterally Neurological: alert & oriented x 3, no focal deficit Skin: warm and dry Psych: normal mood and behavior  Assessment & Plan:   Hypertension BP Readings from Last 3 Encounters:  05/16/24 (!) 147/95  03/27/24 130/81  11/14/23 133/85  [name] presented to the office for a 4-week follow-up to discuss her chronic conditions, including hypertension. Four weeks ago, she presented with concerns of lower extremity swelling, which was thought to be induced by amlodipine . At that time, her amlodipine  was discontinued, and she was started on losartan  50 mg. Today, she reports complete resolution of her lower extremity edema, which is reassuring. Her blood pressure was measured at 147/95 in the office, which is somewhat elevated. She mentioned that she did not take her blood pressure medication today, as she typically takes them after lunch. Her previous in-office blood pressure readings have been somewhat lower than today's, so I am not inclined to make any medication changes at this time. I have ordered a BMP and will continue her on losartan  50 mg. I advised her to take  her blood pressure medication in the morning, before her office visits. If her blood pressure remains elevated at her next visit, we will consider optimizing her losartan  dose or adding a second agent, preferably a thiazide. - BMP - Continue losartan  50 mg daily  Neuropathic pain of forearm, right During today's office visit, the patient continued to report tingling and burning pain in her arms and lower extremities. She noted that she has previously tried physical therapy and Voltaren  gel without significant relief. Due to persistent symptoms, she has been taking multiple doses of Advil  at home in an attempt to manage the pain. We discussed the potential risks of frequent NSAID use, particularly its impact on kidney  function. Given her ongoing symptoms and inadequate response to current measures, we will initiate gabapentin  300 mg three times daily for neuropathic pain. We will reassess her response and tolerance at the next visit. - Prescribed gabapentin  300 mg 3 times daily  HYPERCHOLESTEROLEMIA The patient's lipid panel from two years ago was at goal. She is currently managed on Crestor  (rosuvastatin ) 10 mg daily and appears to tolerate the medication well, with no reported myalgias or adverse effects. A repeat lipid panel has been ordered today, and medication adjustments will be made if clinically indicated based on the results. - Repeat lipid panel today - Continue on Crestor  10 mg  Tobacco use disorder The patient continues to smoke approximately 3 cigarettes per day. She prefers to defer discussions about smoking cessation pharmacotherapy at this time. We reviewed the potential health risks associated with continued tobacco use, and she appears to understand the importance of cessation. She is due for repeat lung cancer screening, and I have placed the order for this today. -CT for lung cancer screening order placed  Colon cancer screening FOBT test ordered     Patient discussed  with Dr. Karna Drue Grow, M.D [facility] Phone: [phone] Date 05/18/2024 Time 10:41 PM

## 2024-05-16 NOTE — Assessment & Plan Note (Signed)
 FOBT test ordered

## 2024-05-16 NOTE — Assessment & Plan Note (Addendum)
 During today's office visit, the patient continued to report tingling and burning pain in her arms and lower extremities. She noted that she has previously tried physical therapy and Voltaren  gel without significant relief. Due to persistent symptoms, she has been taking multiple doses of Advil  at home in an attempt to manage the pain. We discussed the potential risks of frequent NSAID use, particularly its impact on kidney function. Given her ongoing symptoms and inadequate response to current measures, we will initiate gabapentin 300 mg three times daily for neuropathic pain. We will reassess her response and tolerance at the next visit. - Prescribed gabapentin 300 mg 3 times daily

## 2024-05-17 LAB — BASIC METABOLIC PANEL WITH GFR
BUN/Creatinine Ratio: 9 — ABNORMAL LOW (ref 12–28)
BUN: 7 mg/dL — ABNORMAL LOW (ref 8–27)
CO2: 18 mmol/L — ABNORMAL LOW (ref 20–29)
Calcium: 9.4 mg/dL (ref 8.7–10.3)
Chloride: 104 mmol/L (ref 96–106)
Creatinine, Ser: 0.82 mg/dL (ref 0.57–1.00)
Glucose: 99 mg/dL (ref 70–99)
Potassium: 4 mmol/L (ref 3.5–5.2)
Sodium: 138 mmol/L (ref 134–144)
eGFR: 82 mL/min/1.73 (ref >59)

## 2024-05-17 LAB — LIPID PANEL
Chol/HDL Ratio: 2.8 ratio (ref 0.0–4.4)
Cholesterol, Total: 188 mg/dL (ref 100–199)
HDL: 66 mg/dL (ref >39)
LDL Chol Calc (NIH): 98 mg/dL (ref 0–99)
Triglycerides: 136 mg/dL (ref 0–149)
VLDL Cholesterol Cal: 24 mg/dL (ref 5–40)

## 2024-05-19 NOTE — Progress Notes (Signed)
 Internal Medicine Clinic Attending  Case discussed with the resident at the time of the visit.  We reviewed the resident's history and exam and pertinent patient test results.  I agree with the assessment, diagnosis, and plan of care documented in the resident's note.

## 2024-06-03 DIAGNOSIS — Z419 Encounter for procedure for purposes other than remedying health state, unspecified: Secondary | ICD-10-CM | POA: Diagnosis not present

## 2024-06-10 ENCOUNTER — Ambulatory Visit
Admission: RE | Admit: 2024-06-10 | Discharge: 2024-06-10 | Disposition: A | Discharge status: Home / Self Care | Source: Ambulatory Visit | Attending: Acute Care | Admitting: Acute Care

## 2024-06-10 DIAGNOSIS — Z122 Encounter for screening for malignant neoplasm of respiratory organs: Secondary | ICD-10-CM

## 2024-06-10 DIAGNOSIS — F17210 Nicotine dependence, cigarettes, uncomplicated: Secondary | ICD-10-CM | POA: Diagnosis not present

## 2024-06-10 DIAGNOSIS — Z87891 Personal history of nicotine dependence: Secondary | ICD-10-CM

## 2024-06-13 ENCOUNTER — Other Ambulatory Visit (HOSPITAL_COMMUNITY): Payer: Self-pay

## 2024-06-13 ENCOUNTER — Other Ambulatory Visit: Payer: Self-pay

## 2024-06-24 ENCOUNTER — Other Ambulatory Visit: Payer: Self-pay | Admitting: Acute Care

## 2024-06-24 DIAGNOSIS — F17210 Nicotine dependence, cigarettes, uncomplicated: Secondary | ICD-10-CM

## 2024-06-24 DIAGNOSIS — Z87891 Personal history of nicotine dependence: Secondary | ICD-10-CM

## 2024-06-24 DIAGNOSIS — Z122 Encounter for screening for malignant neoplasm of respiratory organs: Secondary | ICD-10-CM

## 2024-06-25 ENCOUNTER — Other Ambulatory Visit: Payer: Self-pay

## 2024-07-04 DIAGNOSIS — Z419 Encounter for procedure for purposes other than remedying health state, unspecified: Secondary | ICD-10-CM | POA: Diagnosis not present

## 2024-07-10 ENCOUNTER — Other Ambulatory Visit: Payer: Self-pay

## 2024-08-07 ENCOUNTER — Other Ambulatory Visit: Payer: Self-pay

## 2024-08-07 ENCOUNTER — Other Ambulatory Visit: Payer: Self-pay | Admitting: Student

## 2024-08-08 ENCOUNTER — Other Ambulatory Visit: Payer: Self-pay

## 2024-08-08 MED FILL — Gabapentin Cap 300 MG: ORAL | 30 days supply | Qty: 90 | Fill #0 | Status: AC

## 2024-09-03 DIAGNOSIS — Z419 Encounter for procedure for purposes other than remedying health state, unspecified: Secondary | ICD-10-CM | POA: Diagnosis not present

## 2024-09-04 ENCOUNTER — Other Ambulatory Visit: Payer: Self-pay

## 2024-09-04 MED FILL — Gabapentin Cap 300 MG: ORAL | 30 days supply | Qty: 90 | Fill #1 | Status: AC

## 2024-09-08 ENCOUNTER — Other Ambulatory Visit: Payer: Self-pay

## 2024-09-16 ENCOUNTER — Other Ambulatory Visit: Payer: Self-pay

## 2024-09-23 ENCOUNTER — Other Ambulatory Visit: Payer: Self-pay

## 2024-10-01 ENCOUNTER — Other Ambulatory Visit: Payer: Self-pay

## 2024-10-01 MED FILL — Gabapentin Cap 300 MG: ORAL | 30 days supply | Qty: 90 | Fill #2 | Status: AC

## 2024-10-02 ENCOUNTER — Other Ambulatory Visit: Payer: Self-pay | Admitting: Student

## 2024-10-02 DIAGNOSIS — Z1231 Encounter for screening mammogram for malignant neoplasm of breast: Secondary | ICD-10-CM

## 2024-10-10 ENCOUNTER — Other Ambulatory Visit: Payer: Self-pay

## 2024-10-14 ENCOUNTER — Other Ambulatory Visit: Payer: Self-pay

## 2024-10-14 MED FILL — Gabapentin Cap 300 MG: ORAL | 30 days supply | Qty: 90 | Fill #2 | Status: AC

## 2024-10-24 ENCOUNTER — Ambulatory Visit
Admission: RE | Admit: 2024-10-24 | Discharge: 2024-10-24 | Disposition: A | Discharge status: Home / Self Care | Source: Ambulatory Visit | Attending: Internal Medicine | Admitting: Internal Medicine

## 2024-10-24 DIAGNOSIS — Z1231 Encounter for screening mammogram for malignant neoplasm of breast: Secondary | ICD-10-CM

## 2024-10-27 ENCOUNTER — Other Ambulatory Visit

## 2024-10-27 DIAGNOSIS — Z1211 Encounter for screening for malignant neoplasm of colon: Secondary | ICD-10-CM

## 2024-10-28 LAB — FECAL OCCULT BLOOD, IMMUNOCHEMICAL: Fecal Occult Bld: NEGATIVE

## 2024-11-10 ENCOUNTER — Ambulatory Visit: Payer: Self-pay | Admitting: Student

## 2024-11-10 ENCOUNTER — Other Ambulatory Visit (HOSPITAL_COMMUNITY): Payer: Self-pay

## 2024-11-10 VITALS — BP 153/89 | HR 70 | Temp 98.2°F | Ht 68.0 in | Wt 178.0 lb

## 2024-11-10 DIAGNOSIS — E7800 Pure hypercholesterolemia, unspecified: Secondary | ICD-10-CM

## 2024-11-10 DIAGNOSIS — I10 Essential (primary) hypertension: Secondary | ICD-10-CM

## 2024-11-10 DIAGNOSIS — Z23 Encounter for immunization: Secondary | ICD-10-CM

## 2024-11-10 DIAGNOSIS — E782 Mixed hyperlipidemia: Secondary | ICD-10-CM | POA: Insufficient documentation

## 2024-11-10 DIAGNOSIS — F17210 Nicotine dependence, cigarettes, uncomplicated: Secondary | ICD-10-CM | POA: Diagnosis not present

## 2024-11-10 DIAGNOSIS — Z0000 Encounter for general adult medical examination without abnormal findings: Secondary | ICD-10-CM

## 2024-11-10 DIAGNOSIS — F17200 Nicotine dependence, unspecified, uncomplicated: Secondary | ICD-10-CM

## 2024-11-10 MED ORDER — ROSUVASTATIN CALCIUM 20 MG PO TABS
20.0000 mg | ORAL_TABLET | Freq: Every day | ORAL | 2 refills | Status: AC
  Filled 2024-11-10: qty 60, 60d supply, fill #0

## 2024-11-10 NOTE — Assessment & Plan Note (Signed)
 BP Readings from Last 3 Encounters:  11/10/24 (!) 153/89  05/16/24 (!) 147/95  03/27/24 130/81   For her hypertension, during her visit last July, I advised that she take her losartan  2 hours before her appointment and recommended she check her blood pressure  at home. However, she has not followed these recommendations and did not take her BP medications today. Her BP today is 159/95. Her kidney function was stable 5 months ago. Given that she did not take her medications this morning, I will not make any changes to her treatment at this time. She will begin keeping a BP log at home and return for follow-up in 2 weeks for further evaluation. - Continue losartan  50 mg daily -Follow-up in 2 weeks for nurse only visit for blood pressure assessment -Patient is recommended to take her blood pressure medicine 2 hours before her appointment -Given her smoking history ,if her blood pressure continues to be above goal consider optimizing losartan  or adding a second agent.

## 2024-11-10 NOTE — Assessment & Plan Note (Signed)
 Flu Vaccine given today.

## 2024-11-10 NOTE — Progress Notes (Signed)
 "  CC: Routine office visit   HPI:  [name] is a 64 y.o. female living with a history stated below and presents today for routine office . Please see problem based assessment and plan for additional details.  Past Medical History:  Diagnosis Date   Anxiety    Childhood asthma    Depression    Fibroids    GERD (gastroesophageal reflux disease)    Headache    monthly (05/15/2017)   High cholesterol    Hypertension    Pneumonia    twice when I was young (05/15/2017)   PONV (postoperative nausea and vomiting)    in the past used to get nauseous   S/P TAH (total abdominal hysterectomy) 09/07/2015   Seizures (HCC) last sz 04/2017   I've had grand mal; usually hit the floor and wake up w/in a minute or so;last one was today, 05/15/2017   Subdural hematoma (HCC) 01/29/2013   Per MRI 01/2013; had a seizure and busted my head open    Medications Ordered Prior to Encounter[1]  Family History  Problem Relation Age of Onset   Cirrhosis Mother    Diabetes Mellitus I Mother    Breast cancer Mother    Esophageal cancer Father    Hypertension Father    Breast cancer Sister    Cancer Sister    Heart attack Brother     Social History   Socioeconomic History   Marital status: Significant Other    Spouse name: kelvin   Number of children: 0   Years of education: HS   Highest education level: 12th grade  Occupational History   Occupation: Unemployed  Tobacco Use   Smoking status: Every Day    Current packs/day: 0.50    Average packs/day: 0.5 packs/day for 41.0 years (20.5 ttl pk-yrs)    Types: Cigarettes   Smokeless tobacco: Never  Vaping Use   Vaping status: Every Day   Substances: Nicotine , Flavoring  Substance and Sexual Activity   Alcohol use: Yes    Alcohol/week: 6.0 standard drinks of alcohol    Types: 6 Cans of beer per week    Comment: occ.   Drug use: No   Sexual activity: Yes    Birth control/protection: None, Surgical  Other Topics Concern    Not on file  Social History Narrative   Lives at home with boyfriend.   Right-handed.   1 cup caffeine per day.   Social Drivers of Health   Tobacco Use: High Risk (03/27/2024)   Patient History    Smoking Tobacco Use: Every Day    Smokeless Tobacco Use: Never    Passive Exposure: Not on file  Financial Resource Strain: Not on file  Food Insecurity: No Food Insecurity (05/16/2024)   Epic    Worried About Programme Researcher, Broadcasting/film/video in the Last Year: Never true    Ran Out of Food in the Last Year: Never true  Transportation Needs: No Transportation Needs (05/16/2024)   Epic    Lack of Transportation (Medical): No    Lack of Transportation (Non-Medical): No  Physical Activity: Not on file  Stress: Not on file  Social Connections: Not on file  Intimate Partner Violence: Not on file  Depression [phone]): Low Risk (11/10/2024)   Depression (PHQ2-9)    PHQ-2 Score: 0  Alcohol Screen: Not on file  Housing: Unknown (05/16/2024)   Epic    Unable to Pay for Housing in the Last Year: No    Number of Times  Moved in the Last Year: Not on file    Homeless in the Last Year: No  Utilities: Not At Risk (05/16/2024)   Epic    Threatened with loss of utilities: No  Health Literacy: Not on file    Review of Systems: ROS negative except for what is noted on the assessment and plan.  Vitals:   11/10/24 0917 11/10/24 0932  BP: (!) 159/95 (!) 153/89  Pulse: 78 70  Temp: 98.2 F (36.8 C)   TempSrc: Oral   SpO2: 98%   Weight: 178 lb (80.7 kg)   Height: 5' 8 (1.727 m)     Physical Exam: Constitutional: well-appearing woman, sitting in chair , in no acute distress Cardiovascular: regular rate and rhythm, no m/r/g Pulmonary/Chest: normal work of breathing on room air, lungs clear to auscultation bilaterally Skin: warm and dry Psych: normal mood and behavior  Assessment & Plan:   HYPERCHOLESTEROLEMIA - Last LDL was 98 ,5 months ago, goal < 70  - Has been on Crestor  10 mg  since 01/12/2022   - ASCVD risk of  8.9% - Will increase crestor  to 20 mg - Side effects of statins adequately discussed   - Check lipid profile  next visit   Hypertension BP Readings from Last 3 Encounters:  11/10/24 (!) 153/89  05/16/24 (!) 147/95  03/27/24 130/81   For her hypertension, during her visit last July, I advised that she take her losartan  2 hours before her appointment and recommended she check her blood pressure  at home. However, she has not followed these recommendations and did not take her BP medications today. Her BP today is 159/95. Her kidney function was stable 5 months ago. Given that she did not take her medications this morning, I will not make any changes to her treatment at this time. She will begin keeping a BP log at home and return for follow-up in 2 weeks for further evaluation. - Continue losartan  50 mg daily -Follow-up in 2 weeks for nurse only visit for blood pressure assessment -Patient is recommended to take her blood pressure medicine 2 hours before her appointment -Given her smoking history ,if her blood pressure continues to be above goal consider optimizing losartan  or adding a second agent.  Tobacco use disorder Patient reports smoking about 3 cigarettes daily, though actual use may be higher. She underwent a CT chest in August 2025, which showed no evidence of malignancy. Plan to repeat low-dose CT chest in August 2026. She has been counseled multiple times on tobacco cessation, including discussion of evidence-based therapies to aid quitting. Patient is not ready to try these therapies at this time. -  Continue counseling as appropriate. - Repeat Low dose Ct Chest ,August 2026  Health care maintenance Flu Vaccine given today     Patient discussed with Dr. Francesco Drue Grow, M.D [facility] Phone: [phone] Date 11/10/2024 Time 6:10 PM      [1]  Current Outpatient Medications on File Prior to Visit  Medication Sig Dispense  Refill   Capsaicin -Menthol -Methyl Sal (CAPSAICIN -METHYL SAL-MENTHOL ) 0.025-1-12 % CREA Apply 1 Application topically 4 (four) times daily as needed. 56.6 g 2   diclofenac  Sodium (VOLTAREN  ARTHRITIS PAIN) 1 % GEL Apply 4 g topically 4 (four) times daily. 100 g 1   gabapentin  (NEURONTIN ) 300 MG capsule Take 1 capsule (300 mg total) by mouth 3 (three) times daily. 90 capsule 2   losartan  (COZAAR ) 50 MG tablet Take 1 tablet (50 mg total)  by mouth daily. 90 tablet 3   Multiple Vitamin (MULTIVITAMIN WITH MINERALS) TABS tablet Take 1 tablet by mouth daily. 30 tablet 0   No current facility-administered medications on file prior to visit.   "

## 2024-11-10 NOTE — Patient Instructions (Signed)
 Thank you, Ms.Alexxia Shrieves for allowing us  to provide your care today. Today we discussed your blood pressure.  BP Readings from Last 3 Encounters:  11/10/24 (!) 153/89  05/16/24 (!) 147/95  03/27/24 130/81    Please DONT FORGET to measure your BP 2 hours after taking your medications and keep a log. Please be sure to take your BP medicine 2 hours before your next appointment .   I am increasing your Crestor  to 20 mg daily as discussed   I have ordered the following labs for you:  Lab Orders  No laboratory test(s) ordered today     Tests ordered today:    Referrals ordered today:   Referral Orders  No referral(s) requested today     I have ordered the following medication/changed the following medications:   Stop the following medications: There are no discontinued medications.   Start the following medications: No orders of the defined types were placed in this encounter.    Follow up: 2 weeks to manage BP meds    Remember: Bring your BP log  Should you have any questions or concerns please call the internal medicine clinic at [phone].   Drue Lisa Grow MD 11/10/2024, 9:53 AM   [facility]

## 2024-11-10 NOTE — Assessment & Plan Note (Signed)
 Patient reports smoking about 3 cigarettes daily, though actual use may be higher. She underwent a CT chest in August 2025, which showed no evidence of malignancy. Plan to repeat low-dose CT chest in August 2026. She has been counseled multiple times on tobacco cessation, including discussion of evidence-based therapies to aid quitting. Patient is not ready to try these therapies at this time. -  Continue counseling as appropriate. - Repeat Low dose Ct Chest ,August 2026

## 2024-11-10 NOTE — Assessment & Plan Note (Signed)
 Last LDL was 98 ,5 months ago, Will increase crestor  to 20mg .  - Check lipid profile  next visit

## 2024-11-10 NOTE — Assessment & Plan Note (Deleted)
 Last LDL was 98 ,5 months ago, Will increase crestor  to 20mg . Check lipids next visit

## 2024-11-10 NOTE — Assessment & Plan Note (Addendum)
-   Last LDL was 98 ,5 months ago, goal < 70  - Has been on Crestor  10 mg  since 01/12/2022  - ASCVD risk of  8.9% - Will increase crestor  to 20 mg - Side effects of statins adequately discussed   - Check lipid profile  next visit

## 2024-11-11 ENCOUNTER — Other Ambulatory Visit: Payer: Self-pay

## 2024-11-12 NOTE — Progress Notes (Signed)
 Internal Medicine Clinic Attending  Case discussed with the resident at the time of the visit.  We reviewed the resident's history and exam and pertinent patient test results.  I agree with the assessment, diagnosis, and plan of care documented in the resident's note.

## 2024-11-13 ENCOUNTER — Ambulatory Visit (HOSPITAL_BASED_OUTPATIENT_CLINIC_OR_DEPARTMENT_OTHER)
Admission: RE | Admit: 2024-11-13 | Discharge: 2024-11-13 | Disposition: A | Discharge status: Home / Self Care | Source: Ambulatory Visit | Attending: Internal Medicine | Admitting: Internal Medicine

## 2024-11-13 DIAGNOSIS — Z13820 Encounter for screening for osteoporosis: Secondary | ICD-10-CM | POA: Insufficient documentation
